# Patient Record
Sex: Female | Born: 1945
Health system: Southern US, Community
[De-identification: ages and names within clinical notes are randomized; demographics above are authoritative.]

## PROBLEM LIST (undated history)

## (undated) DIAGNOSIS — C44300 Unspecified malignant neoplasm of skin of unspecified part of face: Secondary | ICD-10-CM

## (undated) DIAGNOSIS — I493 Ventricular premature depolarization: Secondary | ICD-10-CM

## (undated) DIAGNOSIS — K219 Gastro-esophageal reflux disease without esophagitis: Secondary | ICD-10-CM

## (undated) DIAGNOSIS — E039 Hypothyroidism, unspecified: Secondary | ICD-10-CM

## (undated) DIAGNOSIS — I499 Cardiac arrhythmia, unspecified: Secondary | ICD-10-CM

## (undated) DIAGNOSIS — F32A Depression, unspecified: Secondary | ICD-10-CM

## (undated) DIAGNOSIS — F329 Major depressive disorder, single episode, unspecified: Secondary | ICD-10-CM

## (undated) DIAGNOSIS — I456 Pre-excitation syndrome: Secondary | ICD-10-CM

## (undated) DIAGNOSIS — M1990 Unspecified osteoarthritis, unspecified site: Secondary | ICD-10-CM

## (undated) DIAGNOSIS — E785 Hyperlipidemia, unspecified: Secondary | ICD-10-CM

## (undated) HISTORY — PX: SKIN CANCER EXCISION: SHX779

## (undated) HISTORY — DX: Hyperlipidemia, unspecified: E78.5

## (undated) HISTORY — DX: Ventricular premature depolarization: I49.3

## (undated) HISTORY — PX: OTHER SURGICAL HISTORY: SHX169

## (undated) HISTORY — PX: EYE SURGERY: SHX253

## (undated) HISTORY — PX: COLONOSCOPY: SHX174

## (undated) HISTORY — PX: TUBAL LIGATION: SHX77

## (undated) HISTORY — PX: TONSILLECTOMY: SUR1361

## (undated) HISTORY — DX: Pre-excitation syndrome: I45.6

---

## 2007-11-16 ENCOUNTER — Ambulatory Visit: Payer: Self-pay | Admitting: Internal Medicine

## 2008-01-25 ENCOUNTER — Ambulatory Visit: Payer: Self-pay | Admitting: Internal Medicine

## 2008-04-18 ENCOUNTER — Encounter: Payer: Self-pay | Admitting: Internal Medicine

## 2008-05-04 ENCOUNTER — Ambulatory Visit: Payer: Self-pay | Admitting: Internal Medicine

## 2009-05-13 ENCOUNTER — Ambulatory Visit: Payer: Self-pay | Admitting: Internal Medicine

## 2010-07-22 NOTE — Letter (Signed)
November 16, 2007    Laura Singleton  [address]  [place]  [place] [address]   Laura Singleton, Laura Singleton  MRN:  [id number]  /  DOB:  05-23-1945   ADDENDUM:    IMPRESSION:  1. Antegrade ventricular pre-excitation, question related to      tachycardia.  2. Frequent ventricular ectopy with a right bundle-branch block,      inferior axis morphology, negative in I and L, and upright in lead      V1.  There is an R-wave in I and a QRS in aVL.  3. Significant symptoms of dizziness and fatigue associated with      frequent ventricular ectopy.  4. Sleep disturbance, question related to frequent ventricular ectopy.  5. Normal left ventricular function.   [name], [name] has significant ventricular ectopy.  Thankfully, it does  not translate into cardiomyopathy at this point, but I do think given  her symptoms, suppression of it is essential.  I would like to try beta-  blockers initially, and I have given her prescriptions for a couple of  different ones to see how it is that she tolerates them and see whether  these will effectively suppress the PVCs and result in an increase in  her palpated heart rate.  In the event that this is not accomplishable,  antiarrhythmic drugs would be the next step.  I would like to use  flecainide.  Once the agents like flecainide and propafenone are most  safely used in the patients who do not have coronary artery disease, so  we have taken the liberty of scheduling her for a Myoview stress test at  [facility] in [place] to help US guide therapy.   Catheter ablation is also something that we could undertaken the event  that we cannot eliminate this with medication.   Ventricular arrhythmias like this when they originate from the right  side, the hearts beat spontaneously in 10-20% of patients.  I do not  know whether this will translate into these rhythms that are originating  from left side of the heart or not, so we will have to wait and see.   [name], it  was a pleasure to see her.  Thanks very much for your time  today and I look forward to talking to you.    Sincerely,      Duke Salvia, MD, [facility]    SCK/MedQ  DD: 11/16/2007  DT: 11/17/2007  Job #: [id number]

## 2010-07-22 NOTE — Letter (Signed)
May 13, 2009    Renae Fickle, MD  [address]  [place], [place] [address]   RE:  Laura, Singleton  MRN:  [id number]  /  DOB:  [birth year]   Dear [name]:   Laura Singleton comes in follow-up for asymptomatic ventricular pre-excitation  and symptomatic PVCs.  As you recall we ended up putting her on  flecainide about 15 months ago and she has tolerated this well.  There  has been a marked improvement in her overall palpitation burden.   She denies chest pain, shortness of breath, or exercise intolerance.   Reviewing the chart she had undergone Myoview scan in the fall of 2009.   Her medications in addition include Effexor as well as Levothyroxine 100  mcg a day.   On examination her blood pressure was well-controlled at 129/75 with a  pulse of 78.  Her weight was 175.  Her neck veins were flat.  Her lungs  were clear.  Heart sounds were regular.  Abdomen was soft.  Extremities  were without edema.  She is alert and oriented in no acute distress.   IMPRESSION:  1. Symptomatic PVCs.  2. Asymptomatic ventricular pre-excitation without documented      tachycardia.   We will continue Laura Singleton on her flecainide.  Probably at 3 plus or  minus years following her last stress test, we should plan to repeat it.  As as she gets older the likelihood of the development of occult  coronary disease increases and is potentially proarrhythmic substrate  for the flecainide.   Thanks for allowing [place] to see her.  We will see her again in a year's  time.    Sincerely,      Laura Salvia, MD, [facility]  Electronically Signed    SCK/MedQ  DD: 05/13/2009  DT: 05/14/2009  Job #: [id number]

## 2010-07-22 NOTE — Letter (Signed)
November 16, 2007    Laura Fickle, MD  [address]  [place], [place] [address]   RE:  Laura Singleton  MRN:  [id number]  /  DOB:  12-14-1945   Dear Laura Singleton,   It was a pleasure seeing Laura Singleton today in consultation because of her  palpitations.   As you recall, she is a 65 year old woman whom I saw years and years ago  and unfortunately I do not have my old records, but who has evidence of  ventricular pre-excitation.  I do not know if she had supraventricular  tachycardia or not.   She was found a month or so ago because of presenting with symptoms of  dizziness and fatigue of having a heart rate of 35 to be in ventricular  bigeminy.  You obtained a Holter monitor which demonstrated 55,000 PVCs  over 117,000 beats or about 50%.  They were monomorphic.  There were  runs of up to 8 beats.   An echocardiogram was done at the hospital.  I was able to obtain that  record today that demonstrated normal left ventricular function.  Her  exercise tolerance has been quite impaired in conjunction with the onset  of these symptoms over the last month or so.   She has no antecedent viral syndrome.   Her past medical history is notable for GE reflux disease, thyroid  disease, depression, constipation, fatigue, and asthma.   Past surgical history is notable for tonsillectomy and tubal ligation.   Her review of systems is broadly negative apart from what was outlined  above.   SOCIAL HISTORY:  She is married.  She has 2 children.  She does not use  cigarettes, alcohol, or recreational drugs.  She works as a Health and safety inspector at [employer].   Her medications currently include Effexor 75 t.i.d., cefoxitin 180,  Prempro, Robinul 1 mg h.s., levothyroxine 100 mcg a day.   She is allergic to SULFA causing hives.   On examination, her blood pressure is 112/70 with a pulse of 40.  Her  weight was 177.  Her HEENT exam demonstrated no icterus or xanthoma.  The neck veins were flat.  The  carotids were brisk and full bilaterally  without bruits.  The back was without kyphosis or scoliosis.  The lungs  were clear.  Heart sounds were irregular with paired bigeminal rhythm.  The abdomen was soft with active bowel sounds without midline pulsation  or hepatomegaly.  Femoral pulses were 2+.  Distal pulses were intact.  There was no clubbing, cyanosis, or edema.  Neurological exam was  grossly normal.  The skin was warm and dry.   Electrocardiogram from your office dated November 03, 2007, demonstrated  sinus rhythm at 75 beats per minute.  There was evidence of antegrade  ventricular pre-excitation probably   IMPRESSION:  1. Antegrade ventricular pre-excitation, question related to      tachycardia.  2. Frequent ventricular ectopy with a right bundle-branch block,      inferior axis morphology, negative in I and L, and upright in lead      V1.  There is an R-wave in I and a QRS in aVL.  3. Significant symptoms of dizziness and fatigue associated with      frequent ventricular ectopy.  4. Sleep disturbance, question related to frequent ventricular ectopy.  5. Normal left ventricular function.   Laura Ruiz, [name] has significant ventricular ectopy.  Thankfully, it does  not translate into cardiomyopathy at this  point, but I do think given  her symptoms, suppression of it is essential.  I would like to try beta-  blockers initially, and I have given her prescriptions for a couple of  different ones to see how it is that she tolerates them and see whether  these will effectively suppress the PVCs and result in an increase in  her palpated heart rate.  In the event that this is not accomplishable,  antiarrhythmic drugs would be the next step.  I would like to use  flecainide.  Once the agents like flecainide and propafenone are most  safely used in the patients who do not have coronary artery disease, so  we have taken the liberty of scheduling her for a Myoview stress test at   [facility] in [place] to help US guide therapy.   Catheter ablation is also something that we could undertaken the event  that we cannot eliminate this with medication.   Ventricular arrhythmias like this when they originate from the right  side, the hearts beat spontaneously in 10-20% of patients.  I do not  know whether this will translate into these rhythms that are originating  from left side of the heart or not, so we will have to wait and see.   [name], it was a pleasure to see her.  Thanks very much for your time  today and I look forward to talking to you.    Sincerely,      Duke Salvia, MD, [facility]  Electronically Signed    SCK/MedQ  DD: 11/16/2007  DT: 11/17/2007  Job #: [id number]

## 2010-07-22 NOTE — Assessment & Plan Note (Signed)
[facility]                         ELECTROPHYSIOLOGY OFFICE NOTE   NAME:Melman, Marissah                            MRN:          [id number]  DATE:05/04/2008                            DOB:          [birth year]    Mrs. Stober is seen in followup for symptomatic PVCs, treated with  flecainide 100 mg b.i.d.  She is doing way much better.  She is also  on her Effexor down titrated from 3 times a day to 2 times a day at 75  mg tablets, __________.   On examination, her blood pressure is mildly elevated at 148/94, which  was also seen last time.  Her pulse was 68.  Her lungs were clear.  Heart sounds were regular.  Extremities were without edema.   IMPRESSION:  1. Symptomatic premature ventricular contractions.  2. Hypertension.  3. Anxiety.  4. Antegrade pre-excitation without evidence of tachycardia.   Mrs. Markert is doing really very well.  I will have her follow up with Dr.  Samuel Germany regarding her blood pressure.  I have given her a tentative  followup with me in about a year.  If Dr. Samuel Germany feels comfortable  following her on her flecainide, I will be glad to see her at his  request.     Duke Salvia, MD, [facility]  Electronically Signed    SCK/MedQ  DD: 05/04/2008  DT: 05/05/2008  Job #: [id number]   cc:   Renae Fickle

## 2010-07-22 NOTE — Letter (Signed)
January 25, 2008    Laura Singleton  [address]  [place], [place] [address]   RE:  Laura Singleton, Laura Singleton  MRN:  [id number]  /  DOB:  1946-02-06   Dear Laura Singleton:   Laura Singleton comes in.  She continues to feel fatigued.  We tried her on a  beta-blocker trial with atenolol 50, metoprolol 50, Inderal LA 60; she  had no untoward effects with any of them, but none of them was  particularly associated with improvement in her symptoms notwithstanding  the fact that some of them were associated with a significant  improvement and recorded heart rates in the 60s.   This raise the question for me as to whether her Effexor may be  contributing to some of her lassitude and I suggested that she talk  about this with you and her psychiatrist to see whether either  downtitration of her drug or an alternative drugs might be helpful.   In the interim, I have suggested that we try a 1C antiarrhythmic drug to  see if we can get rid of some of her PVCs and whether that translates  also into improvement in her symptoms.  I should note that her Myoview  scan done by [facility] was normal.   PHYSICAL EXAMINATION:  On examination today her blood pressure 140/90,  her pulse is 69, and her weight was 181.  Her lungs were clear.  Heart  sounds were irregular.  Extremities were without edema.   (Electrocardiogram again demonstrated pre-excitation, which is we think  quiescent as well as pentageminy.   IMPRESSION:  1. Frequent premature ventricular contractions up to 50% according to      the Holter monitor.  2. Antegrade pre-excitation without evidence of reentrant tachycardia.  3. Fatigue, question related frequent premature ventricular versus      depression versus antidepressant side effect.   PLAN:  We will plan to see her again in about 3-4 weeks, Laura Singleton after she  gets started on the flecainide 100 mg twice daily, we will hold off on  any adjunctive beta blockers.  At this time, I have also asked her  to  follow up with a psychiatrist to see what they can do about changing her  antidepressants.    Sincerely,      Duke Salvia, MD, [facility]  Electronically Signed    SCK/MedQ  DD: 01/25/2008  DT: 01/26/2008  Job #: [id number]

## 2010-11-13 ENCOUNTER — Encounter: Payer: Self-pay | Admitting: Cardiovascular Disease

## 2014-05-02 DIAGNOSIS — Z79899 Other long term (current) drug therapy: Secondary | ICD-10-CM | POA: Diagnosis not present

## 2014-05-02 DIAGNOSIS — M818 Other osteoporosis without current pathological fracture: Secondary | ICD-10-CM | POA: Diagnosis not present

## 2014-05-02 DIAGNOSIS — E785 Hyperlipidemia, unspecified: Secondary | ICD-10-CM | POA: Diagnosis not present

## 2014-05-02 DIAGNOSIS — L309 Dermatitis, unspecified: Secondary | ICD-10-CM | POA: Diagnosis not present

## 2014-05-02 DIAGNOSIS — K210 Gastro-esophageal reflux disease with esophagitis: Secondary | ICD-10-CM | POA: Diagnosis not present

## 2014-05-02 DIAGNOSIS — R002 Palpitations: Secondary | ICD-10-CM | POA: Diagnosis not present

## 2014-05-02 DIAGNOSIS — M816 Localized osteoporosis [Lequesne]: Secondary | ICD-10-CM | POA: Diagnosis not present

## 2014-05-02 DIAGNOSIS — Z0000 Encounter for general adult medical examination without abnormal findings: Secondary | ICD-10-CM | POA: Diagnosis not present

## 2014-05-02 DIAGNOSIS — I456 Pre-excitation syndrome: Secondary | ICD-10-CM | POA: Diagnosis not present

## 2014-05-02 DIAGNOSIS — K219 Gastro-esophageal reflux disease without esophagitis: Secondary | ICD-10-CM | POA: Diagnosis not present

## 2014-08-30 DIAGNOSIS — N63 Unspecified lump in breast: Secondary | ICD-10-CM | POA: Diagnosis not present

## 2014-08-30 DIAGNOSIS — N6012 Diffuse cystic mastopathy of left breast: Secondary | ICD-10-CM | POA: Diagnosis not present

## 2014-12-11 DIAGNOSIS — H35342 Macular cyst, hole, or pseudohole, left eye: Secondary | ICD-10-CM | POA: Diagnosis not present

## 2014-12-11 DIAGNOSIS — H35373 Puckering of macula, bilateral: Secondary | ICD-10-CM | POA: Diagnosis not present

## 2014-12-11 DIAGNOSIS — Z9842 Cataract extraction status, left eye: Secondary | ICD-10-CM | POA: Diagnosis not present

## 2014-12-11 DIAGNOSIS — H43813 Vitreous degeneration, bilateral: Secondary | ICD-10-CM | POA: Diagnosis not present

## 2014-12-11 DIAGNOSIS — H5201 Hypermetropia, right eye: Secondary | ICD-10-CM | POA: Diagnosis not present

## 2014-12-11 DIAGNOSIS — Z9841 Cataract extraction status, right eye: Secondary | ICD-10-CM | POA: Diagnosis not present

## 2014-12-11 DIAGNOSIS — H43393 Other vitreous opacities, bilateral: Secondary | ICD-10-CM | POA: Diagnosis not present

## 2014-12-11 DIAGNOSIS — Z961 Presence of intraocular lens: Secondary | ICD-10-CM | POA: Diagnosis not present

## 2014-12-11 DIAGNOSIS — H35372 Puckering of macula, left eye: Secondary | ICD-10-CM | POA: Diagnosis not present

## 2015-05-17 DIAGNOSIS — Z1231 Encounter for screening mammogram for malignant neoplasm of breast: Secondary | ICD-10-CM | POA: Diagnosis not present

## 2015-05-17 DIAGNOSIS — E038 Other specified hypothyroidism: Secondary | ICD-10-CM | POA: Diagnosis not present

## 2015-05-17 DIAGNOSIS — M818 Other osteoporosis without current pathological fracture: Secondary | ICD-10-CM | POA: Diagnosis not present

## 2015-05-17 DIAGNOSIS — E663 Overweight: Secondary | ICD-10-CM | POA: Diagnosis not present

## 2015-05-17 DIAGNOSIS — Z0000 Encounter for general adult medical examination without abnormal findings: Secondary | ICD-10-CM | POA: Diagnosis not present

## 2015-05-17 DIAGNOSIS — E785 Hyperlipidemia, unspecified: Secondary | ICD-10-CM | POA: Diagnosis not present

## 2015-05-17 DIAGNOSIS — L237 Allergic contact dermatitis due to plants, except food: Secondary | ICD-10-CM | POA: Diagnosis not present

## 2015-05-17 DIAGNOSIS — Z79899 Other long term (current) drug therapy: Secondary | ICD-10-CM | POA: Diagnosis not present

## 2015-05-17 DIAGNOSIS — I456 Pre-excitation syndrome: Secondary | ICD-10-CM | POA: Diagnosis not present

## 2015-05-17 DIAGNOSIS — Z1389 Encounter for screening for other disorder: Secondary | ICD-10-CM | POA: Diagnosis not present

## 2015-05-17 DIAGNOSIS — Z6827 Body mass index (BMI) 27.0-27.9, adult: Secondary | ICD-10-CM | POA: Diagnosis not present

## 2015-08-12 DIAGNOSIS — K5900 Constipation, unspecified: Secondary | ICD-10-CM | POA: Diagnosis not present

## 2015-08-12 DIAGNOSIS — K219 Gastro-esophageal reflux disease without esophagitis: Secondary | ICD-10-CM | POA: Diagnosis not present

## 2015-08-20 DIAGNOSIS — K644 Residual hemorrhoidal skin tags: Secondary | ICD-10-CM | POA: Diagnosis not present

## 2015-08-20 DIAGNOSIS — Z79899 Other long term (current) drug therapy: Secondary | ICD-10-CM | POA: Diagnosis not present

## 2015-08-20 DIAGNOSIS — Z86010 Personal history of colonic polyps: Secondary | ICD-10-CM | POA: Diagnosis not present

## 2015-08-20 DIAGNOSIS — Z1211 Encounter for screening for malignant neoplasm of colon: Secondary | ICD-10-CM | POA: Diagnosis not present

## 2015-08-20 DIAGNOSIS — K5730 Diverticulosis of large intestine without perforation or abscess without bleeding: Secondary | ICD-10-CM | POA: Diagnosis not present

## 2015-08-20 DIAGNOSIS — K648 Other hemorrhoids: Secondary | ICD-10-CM | POA: Diagnosis not present

## 2015-08-27 DIAGNOSIS — M1380 Other specified arthritis, unspecified site: Secondary | ICD-10-CM | POA: Diagnosis not present

## 2015-09-03 DIAGNOSIS — Z1231 Encounter for screening mammogram for malignant neoplasm of breast: Secondary | ICD-10-CM | POA: Diagnosis not present

## 2015-10-01 DIAGNOSIS — M79643 Pain in unspecified hand: Secondary | ICD-10-CM | POA: Diagnosis not present

## 2015-10-01 DIAGNOSIS — L309 Dermatitis, unspecified: Secondary | ICD-10-CM | POA: Diagnosis not present

## 2015-10-01 DIAGNOSIS — Z6828 Body mass index (BMI) 28.0-28.9, adult: Secondary | ICD-10-CM | POA: Diagnosis not present

## 2015-10-09 DIAGNOSIS — G5603 Carpal tunnel syndrome, bilateral upper limbs: Secondary | ICD-10-CM | POA: Diagnosis not present

## 2015-10-09 DIAGNOSIS — M180 Bilateral primary osteoarthritis of first carpometacarpal joints: Secondary | ICD-10-CM | POA: Diagnosis not present

## 2015-10-24 DIAGNOSIS — R200 Anesthesia of skin: Secondary | ICD-10-CM | POA: Diagnosis not present

## 2015-11-14 DIAGNOSIS — M180 Bilateral primary osteoarthritis of first carpometacarpal joints: Secondary | ICD-10-CM | POA: Diagnosis not present

## 2015-11-14 DIAGNOSIS — G5603 Carpal tunnel syndrome, bilateral upper limbs: Secondary | ICD-10-CM | POA: Diagnosis not present

## 2015-11-18 DIAGNOSIS — E038 Other specified hypothyroidism: Secondary | ICD-10-CM | POA: Diagnosis not present

## 2015-11-18 DIAGNOSIS — Z6827 Body mass index (BMI) 27.0-27.9, adult: Secondary | ICD-10-CM | POA: Diagnosis not present

## 2015-11-18 DIAGNOSIS — Z01818 Encounter for other preprocedural examination: Secondary | ICD-10-CM | POA: Diagnosis not present

## 2015-11-18 DIAGNOSIS — E785 Hyperlipidemia, unspecified: Secondary | ICD-10-CM | POA: Diagnosis not present

## 2015-11-18 DIAGNOSIS — I456 Pre-excitation syndrome: Secondary | ICD-10-CM | POA: Diagnosis not present

## 2015-11-18 DIAGNOSIS — E663 Overweight: Secondary | ICD-10-CM | POA: Diagnosis not present

## 2015-11-18 DIAGNOSIS — K219 Gastro-esophageal reflux disease without esophagitis: Secondary | ICD-10-CM | POA: Diagnosis not present

## 2015-11-18 DIAGNOSIS — M79643 Pain in unspecified hand: Secondary | ICD-10-CM | POA: Diagnosis not present

## 2015-11-21 DIAGNOSIS — R8290 Unspecified abnormal findings in urine: Secondary | ICD-10-CM | POA: Diagnosis not present

## 2015-12-19 DIAGNOSIS — M180 Bilateral primary osteoarthritis of first carpometacarpal joints: Secondary | ICD-10-CM | POA: Diagnosis not present

## 2015-12-19 DIAGNOSIS — G5603 Carpal tunnel syndrome, bilateral upper limbs: Secondary | ICD-10-CM | POA: Diagnosis not present

## 2015-12-20 DIAGNOSIS — G5601 Carpal tunnel syndrome, right upper limb: Secondary | ICD-10-CM | POA: Diagnosis not present

## 2015-12-20 DIAGNOSIS — M180 Bilateral primary osteoarthritis of first carpometacarpal joints: Secondary | ICD-10-CM | POA: Diagnosis not present

## 2015-12-20 DIAGNOSIS — E038 Other specified hypothyroidism: Secondary | ICD-10-CM | POA: Diagnosis not present

## 2015-12-20 DIAGNOSIS — M1811 Unilateral primary osteoarthritis of first carpometacarpal joint, right hand: Secondary | ICD-10-CM | POA: Diagnosis not present

## 2015-12-20 DIAGNOSIS — E785 Hyperlipidemia, unspecified: Secondary | ICD-10-CM | POA: Diagnosis not present

## 2015-12-20 DIAGNOSIS — F329 Major depressive disorder, single episode, unspecified: Secondary | ICD-10-CM | POA: Diagnosis not present

## 2015-12-20 DIAGNOSIS — Z7982 Long term (current) use of aspirin: Secondary | ICD-10-CM | POA: Diagnosis not present

## 2015-12-20 DIAGNOSIS — Z87891 Personal history of nicotine dependence: Secondary | ICD-10-CM | POA: Diagnosis not present

## 2015-12-20 DIAGNOSIS — K219 Gastro-esophageal reflux disease without esophagitis: Secondary | ICD-10-CM | POA: Diagnosis not present

## 2015-12-20 DIAGNOSIS — M818 Other osteoporosis without current pathological fracture: Secondary | ICD-10-CM | POA: Diagnosis not present

## 2015-12-20 DIAGNOSIS — G8918 Other acute postprocedural pain: Secondary | ICD-10-CM | POA: Diagnosis not present

## 2016-02-26 DIAGNOSIS — M75122 Complete rotator cuff tear or rupture of left shoulder, not specified as traumatic: Secondary | ICD-10-CM | POA: Diagnosis not present

## 2016-03-11 DIAGNOSIS — M818 Other osteoporosis without current pathological fracture: Secondary | ICD-10-CM | POA: Diagnosis not present

## 2016-03-11 DIAGNOSIS — K219 Gastro-esophageal reflux disease without esophagitis: Secondary | ICD-10-CM | POA: Diagnosis not present

## 2016-03-11 DIAGNOSIS — I456 Pre-excitation syndrome: Secondary | ICD-10-CM | POA: Diagnosis not present

## 2016-03-11 DIAGNOSIS — Z79899 Other long term (current) drug therapy: Secondary | ICD-10-CM | POA: Diagnosis not present

## 2016-03-11 DIAGNOSIS — E063 Autoimmune thyroiditis: Secondary | ICD-10-CM | POA: Diagnosis not present

## 2016-03-11 DIAGNOSIS — M1990 Unspecified osteoarthritis, unspecified site: Secondary | ICD-10-CM | POA: Diagnosis not present

## 2016-03-11 DIAGNOSIS — E785 Hyperlipidemia, unspecified: Secondary | ICD-10-CM | POA: Diagnosis not present

## 2016-03-11 DIAGNOSIS — R69 Illness, unspecified: Secondary | ICD-10-CM | POA: Diagnosis not present

## 2016-03-11 DIAGNOSIS — Z6829 Body mass index (BMI) 29.0-29.9, adult: Secondary | ICD-10-CM | POA: Diagnosis not present

## 2016-03-11 DIAGNOSIS — Z0000 Encounter for general adult medical examination without abnormal findings: Secondary | ICD-10-CM | POA: Diagnosis not present

## 2016-04-09 DIAGNOSIS — G5601 Carpal tunnel syndrome, right upper limb: Secondary | ICD-10-CM | POA: Diagnosis not present

## 2016-04-09 DIAGNOSIS — M180 Bilateral primary osteoarthritis of first carpometacarpal joints: Secondary | ICD-10-CM | POA: Diagnosis not present

## 2016-05-21 DIAGNOSIS — K219 Gastro-esophageal reflux disease without esophagitis: Secondary | ICD-10-CM | POA: Diagnosis not present

## 2016-05-21 DIAGNOSIS — E063 Autoimmune thyroiditis: Secondary | ICD-10-CM | POA: Diagnosis not present

## 2016-05-21 DIAGNOSIS — R69 Illness, unspecified: Secondary | ICD-10-CM | POA: Diagnosis not present

## 2016-05-21 DIAGNOSIS — E785 Hyperlipidemia, unspecified: Secondary | ICD-10-CM | POA: Diagnosis not present

## 2016-05-21 DIAGNOSIS — E538 Deficiency of other specified B group vitamins: Secondary | ICD-10-CM | POA: Diagnosis not present

## 2016-05-21 DIAGNOSIS — R002 Palpitations: Secondary | ICD-10-CM | POA: Diagnosis not present

## 2016-05-21 DIAGNOSIS — I456 Pre-excitation syndrome: Secondary | ICD-10-CM | POA: Diagnosis not present

## 2016-05-21 DIAGNOSIS — M1990 Unspecified osteoarthritis, unspecified site: Secondary | ICD-10-CM | POA: Diagnosis not present

## 2016-05-28 DIAGNOSIS — G5601 Carpal tunnel syndrome, right upper limb: Secondary | ICD-10-CM | POA: Diagnosis not present

## 2016-05-30 DIAGNOSIS — M180 Bilateral primary osteoarthritis of first carpometacarpal joints: Secondary | ICD-10-CM | POA: Diagnosis not present

## 2016-05-30 DIAGNOSIS — M79644 Pain in right finger(s): Secondary | ICD-10-CM | POA: Diagnosis not present

## 2016-06-01 DIAGNOSIS — G5601 Carpal tunnel syndrome, right upper limb: Secondary | ICD-10-CM | POA: Diagnosis not present

## 2016-06-01 DIAGNOSIS — M180 Bilateral primary osteoarthritis of first carpometacarpal joints: Secondary | ICD-10-CM | POA: Diagnosis not present

## 2016-06-04 DIAGNOSIS — M5410 Radiculopathy, site unspecified: Secondary | ICD-10-CM | POA: Diagnosis not present

## 2016-06-04 DIAGNOSIS — M542 Cervicalgia: Secondary | ICD-10-CM | POA: Diagnosis not present

## 2016-06-18 DIAGNOSIS — R69 Illness, unspecified: Secondary | ICD-10-CM | POA: Diagnosis not present

## 2016-06-24 DIAGNOSIS — M542 Cervicalgia: Secondary | ICD-10-CM | POA: Diagnosis not present

## 2016-06-24 DIAGNOSIS — M4802 Spinal stenosis, cervical region: Secondary | ICD-10-CM | POA: Diagnosis not present

## 2016-06-24 DIAGNOSIS — M5412 Radiculopathy, cervical region: Secondary | ICD-10-CM | POA: Diagnosis not present

## 2016-07-01 DIAGNOSIS — M542 Cervicalgia: Secondary | ICD-10-CM | POA: Diagnosis not present

## 2016-08-20 DIAGNOSIS — M47812 Spondylosis without myelopathy or radiculopathy, cervical region: Secondary | ICD-10-CM | POA: Diagnosis not present

## 2016-08-20 DIAGNOSIS — M5412 Radiculopathy, cervical region: Secondary | ICD-10-CM | POA: Diagnosis not present

## 2016-09-03 DIAGNOSIS — Z1231 Encounter for screening mammogram for malignant neoplasm of breast: Secondary | ICD-10-CM | POA: Diagnosis not present

## 2016-09-18 DIAGNOSIS — M5412 Radiculopathy, cervical region: Secondary | ICD-10-CM | POA: Diagnosis not present

## 2016-10-06 ENCOUNTER — Other Ambulatory Visit: Payer: Self-pay | Admitting: Neurosurgery

## 2016-10-06 DIAGNOSIS — M4802 Spinal stenosis, cervical region: Secondary | ICD-10-CM

## 2016-10-06 DIAGNOSIS — M5412 Radiculopathy, cervical region: Secondary | ICD-10-CM

## 2016-10-06 DIAGNOSIS — Z6828 Body mass index (BMI) 28.0-28.9, adult: Secondary | ICD-10-CM | POA: Diagnosis not present

## 2016-10-06 DIAGNOSIS — G959 Disease of spinal cord, unspecified: Secondary | ICD-10-CM | POA: Diagnosis not present

## 2016-10-06 DIAGNOSIS — R030 Elevated blood-pressure reading, without diagnosis of hypertension: Secondary | ICD-10-CM | POA: Diagnosis not present

## 2016-10-06 HISTORY — DX: Spinal stenosis, cervical region: M48.02

## 2016-10-06 HISTORY — DX: Radiculopathy, cervical region: M54.12

## 2016-10-28 NOTE — Pre-Procedure Instructions (Signed)
Laura Singleton  10/28/2016      [id number] - [place], [place] [address] [place] [address] Phone: [phone] Fax: [phone]    Your procedure is scheduled on November 05, 2016  Report to [place] at 1035 AM.  Call this number if you have problems the morning of surgery:  [phone]   Remember:  Do not eat food or drink liquids after midnight.  Take these medicines the morning of surgery with A SIP OF WATER esomeprazole (nexium), flecainide (tambocor), levothyroxine (synthroid), sertraline (zoloft)  7 days prior to surgery STOP taking any meloxicam (mobic), Aspirin, Aleve, Naproxen, Ibuprofen, Motrin, Advil, Goody's, BC's, all herbal medications, fish oil, and all vitamins   Do not wear jewelry, make-up or nail polish.  Do not wear lotions, powders, or perfumes, or deoderant.  Do not shave 48 hours prior to surgery.    Do not bring valuables to the hospital.  [facility] is not responsible for any belongings or valuables.  Contacts, dentures or bridgework may not be worn into surgery.  Leave your suitcase in the car.  After surgery it may be brought to your room.  For patients admitted to the hospital, discharge time will be determined by your treatment team.  Patients discharged the day of surgery will not be allowed to drive home.   Special instructions:   [facility]- Preparing For Surgery  Before surgery, you can play an important role. Because skin is not sterile, your skin needs to be as free of germs as possible. You can reduce the number of germs on your skin by washing with CHG (chlorahexidine gluconate) Soap before surgery.  CHG is an antiseptic cleaner which kills germs and bonds with the skin to continue killing germs even after washing.  Please do not use if you have an allergy to CHG or antibacterial soaps. If your skin becomes reddened/irritated stop using the CHG.  Do not shave  (including legs and underarms) for at least 48 hours prior to first CHG shower. It is OK to shave your face.  Please follow these instructions carefully.   1. Shower the NIGHT BEFORE SURGERY and the MORNING OF SURGERY with CHG.   2. If you chose to wash your hair, wash your hair first as usual with your normal shampoo.  3. After you shampoo, rinse your hair and body thoroughly to remove the shampoo.  4. Use CHG as you would any other liquid soap. You can apply CHG directly to the skin and wash gently with a scrungie or a clean washcloth.   5. Apply the CHG Soap to your body ONLY FROM THE NECK DOWN.  Do not use on open wounds or open sores. Avoid contact with your eyes, ears, mouth and genitals (private parts). Wash genitals (private parts) with your normal soap.  6. Wash thoroughly, paying special attention to the area where your surgery will be performed.  7. Thoroughly rinse your body with warm water from the neck down.  8. DO NOT shower/wash with your normal soap after using and rinsing off the CHG Soap.  9. Pat yourself dry with a CLEAN TOWEL.   10. Wear CLEAN PAJAMAS   11. Place CLEAN SHEETS on your bed the night of your first shower and DO NOT SLEEP WITH PETS.    Day of Surgery: Do not apply any deodorants/lotions. Please wear clean clothes to the hospital/surgery center.  Please read over the following fact sheets that you were given. Pain Booklet, Coughing and Deep Breathing, MRSA Information and Surgical Site Infection Prevention

## 2016-10-29 ENCOUNTER — Encounter (HOSPITAL_COMMUNITY): Payer: Self-pay | Admitting: *Deleted

## 2016-10-29 ENCOUNTER — Encounter (HOSPITAL_COMMUNITY)
Admission: RE | Admit: 2016-10-29 | Discharge: 2016-10-29 | Disposition: A | Discharge status: Home / Self Care | Payer: Medicare HMO | Source: Ambulatory Visit | Attending: Neurosurgery | Admitting: Neurosurgery

## 2016-10-29 DIAGNOSIS — I493 Ventricular premature depolarization: Secondary | ICD-10-CM | POA: Diagnosis not present

## 2016-10-29 DIAGNOSIS — K219 Gastro-esophageal reflux disease without esophagitis: Secondary | ICD-10-CM | POA: Insufficient documentation

## 2016-10-29 DIAGNOSIS — Z87891 Personal history of nicotine dependence: Secondary | ICD-10-CM | POA: Insufficient documentation

## 2016-10-29 DIAGNOSIS — E039 Hypothyroidism, unspecified: Secondary | ICD-10-CM | POA: Insufficient documentation

## 2016-10-29 DIAGNOSIS — Z7982 Long term (current) use of aspirin: Secondary | ICD-10-CM | POA: Diagnosis not present

## 2016-10-29 DIAGNOSIS — I456 Pre-excitation syndrome: Secondary | ICD-10-CM | POA: Insufficient documentation

## 2016-10-29 DIAGNOSIS — G959 Disease of spinal cord, unspecified: Secondary | ICD-10-CM | POA: Diagnosis not present

## 2016-10-29 DIAGNOSIS — Z01818 Encounter for other preprocedural examination: Secondary | ICD-10-CM | POA: Insufficient documentation

## 2016-10-29 DIAGNOSIS — Z01812 Encounter for preprocedural laboratory examination: Secondary | ICD-10-CM | POA: Insufficient documentation

## 2016-10-29 DIAGNOSIS — Z79899 Other long term (current) drug therapy: Secondary | ICD-10-CM | POA: Insufficient documentation

## 2016-10-29 HISTORY — DX: Depression, unspecified: F32.A

## 2016-10-29 HISTORY — DX: Hypothyroidism, unspecified: E03.9

## 2016-10-29 HISTORY — DX: Major depressive disorder, single episode, unspecified: F32.9

## 2016-10-29 HISTORY — DX: Pre-excitation syndrome: I45.6

## 2016-10-29 HISTORY — DX: Gastro-esophageal reflux disease without esophagitis: K21.9

## 2016-10-29 HISTORY — DX: Unspecified osteoarthritis, unspecified site: M19.90

## 2016-10-29 HISTORY — DX: Cardiac arrhythmia, unspecified: I49.9

## 2016-10-29 LAB — CBC
HCT: 38.3 % (ref 36.0–46.0)
Hemoglobin: 13 g/dL (ref 12.0–15.0)
MCH: 29.7 pg (ref 26.0–34.0)
MCHC: 33.9 g/dL (ref 30.0–36.0)
MCV: 87.4 fL (ref 78.0–100.0)
PLATELETS: 413 10*3/uL — AB (ref 150–400)
RBC: 4.38 MIL/uL (ref 3.87–5.11)
RDW: 14 % (ref 11.5–15.5)
WBC: 9.9 10*3/uL (ref 4.0–10.5)

## 2016-10-29 LAB — BASIC METABOLIC PANEL
Anion gap: 7 (ref 5–15)
BUN: 8 mg/dL (ref 6–20)
CO2: 29 mmol/L (ref 22–32)
CREATININE: 0.76 mg/dL (ref 0.44–1.00)
Calcium: 9.6 mg/dL (ref 8.9–10.3)
Chloride: 103 mmol/L (ref 101–111)
GFR calc Af Amer: >60 mL/min (ref >60)
Glucose, Bld: 99 mg/dL (ref 65–99)
Potassium: 3.9 mmol/L (ref 3.5–5.1)
SODIUM: 139 mmol/L (ref 135–145)

## 2016-10-29 LAB — SURGICAL PCR SCREEN
MRSA, PCR: NEGATIVE
STAPHYLOCOCCUS AUREUS: NEGATIVE

## 2016-10-29 NOTE — Progress Notes (Addendum)
Anesthesia Chart Review:  Pt is a 71 year old female scheduled for C3-4, C5-6, C6-7 ACDF on 11/05/2016 with Kary Kos, MD  - PCP is Wende Neighbors, MD - used to see EP cardiologist Virl Axe, MD, last office visit 2011.  He felt pt's flecainide could be managed by PCP and pt could f/u with him prn.   PMH includes:  Symptomatic PVCs, WPW syndrome, hypothyroidism, GERD. Former smoker. BMI 29  Medications include: ASA 81mg , lipitor, nexium, flecainide, levothyroxine  BP 127/73 Comment: taken in right arm  Pulse 80   Temp 36.6 C   Resp 20   Ht 5\' 3"  (1.6 m)   Wt 164 lb 9.6 oz (74.7 kg)   SpO2 100%   BMI 29.16 kg/m   Preoperative labs reviewed.    EKG 10/29/16: NSR  Dr. Olin Pia notes indicate that pt last had a stress test in 2009 and that periodic stress testing would be necessary to r/o structural heart disease as flecainide can be proarrhythmic in heart disease.  Pt has not had a stress test since 2009.  Dr. Caryl Comes is out of the office this week.  I spoke with Devra Dopp, LPN in his office who discussed situation with Dr. Rayann Heman.  Per Altha Harm, pt will need to reestablish with cardiology for eval prior to surgery.    I notified Lorriane Shire in Dr. Windy Carina office.   Willeen Cass, FNP-BC [facility] Short Stay Surgical Center/Anesthesiology Phone: [phone] 11/03/2016 9:18 AM  Addendum:  Patient was seen by cardiologist Skeet Latch, MD on 11/04/16 for preoperative cardiac clearance and WPW/PVC follow-up. Patient has been very stable on flecainide. She can achieve 4 METS or greater without anginal symptoms. No further work-up recommended prior to surgery (see Letters tab); however, because she has been on flecainide for several years she will get an ETT in the next few months following recovery of her neck surgery. Recommendation to follow-up with Dr. Caryl Comes next year.  [facility] Short Stay Center/Anesthesiology Phone [phone] 11/04/2016 3:38 PM

## 2016-10-29 NOTE — Progress Notes (Signed)
PCP is Dr Wende Neighbors Cardiologist is Dr. Caryl Comes- reports she hasn't seen him in years, that Dr Micheal Likens gives her all her meds. States she was dx with Yves Dill parkinson white by Dr Caryl Comes Denies ever having a card cath. Request sent for last office visit and any heart studies from Dr Bettina Gavia in [place] (who she also saw in the past) Denies any dizziness, chest pain, fever, or cough.

## 2016-11-02 ENCOUNTER — Telehealth: Payer: Self-pay | Admitting: *Deleted

## 2016-11-02 NOTE — Telephone Encounter (Signed)
Lm for pt to let pt know scheduler would be calling to make an appt as needs to reestablish with EP per Dr Allred./cy

## 2016-11-04 ENCOUNTER — Encounter: Payer: Self-pay | Admitting: Cardiovascular Disease

## 2016-11-04 ENCOUNTER — Ambulatory Visit (INDEPENDENT_AMBULATORY_CARE_PROVIDER_SITE_OTHER): Payer: Medicare HMO | Admitting: Cardiovascular Disease

## 2016-11-04 ENCOUNTER — Encounter: Payer: Self-pay | Admitting: *Deleted

## 2016-11-04 VITALS — BP 122/79 | HR 70 | Ht 63.0 in | Wt 163.6 lb

## 2016-11-04 DIAGNOSIS — Z5181 Encounter for therapeutic drug level monitoring: Secondary | ICD-10-CM | POA: Diagnosis not present

## 2016-11-04 DIAGNOSIS — I456 Pre-excitation syndrome: Secondary | ICD-10-CM | POA: Diagnosis not present

## 2016-11-04 DIAGNOSIS — I493 Ventricular premature depolarization: Secondary | ICD-10-CM | POA: Diagnosis not present

## 2016-11-04 DIAGNOSIS — E7800 Pure hypercholesterolemia, unspecified: Secondary | ICD-10-CM

## 2016-11-04 DIAGNOSIS — Z79899 Other long term (current) drug therapy: Secondary | ICD-10-CM | POA: Diagnosis not present

## 2016-11-04 NOTE — Progress Notes (Signed)
Cardiology Office Note   Date:  11/05/2016   ID:  Laura Singleton, DOB Nov 28, 1945, MRN [id number]  PCP:  Ocie Doyne., MD  Cardiologist:   Skeet Latch, MD   No chief complaint on file.    History of Present Illness: Laura Singleton is a 71 y.o. female with WPW, PVCs, hypertension, hyperlipidemia, and anxiety who presents for surgical clearance. She is scheduled to undergo anterior fusion of the cervical spine with Dr. Kary Kos.  She was previously a patient of Dr. Caryl Comes, last seen 07/2010. At that time her PVCs are managed with flecainide. She has been taking this medication since that time and has not had any issues. She denies any palpitations, chest pain, shortness of breath, orthopnea, or PND. She does occasionally have lower extremity edema that is worse when it is hot outside. It always improves with elevation. She typically likes to exercise 3 times per week. She does water aerobics and walks. However lately she has not been able to exercise much due to pain in her neck and weakness in her arms and legs. She has no difficulty walking for more than 4 blocks or up more than one flight of stairs.   Past Medical History:  Diagnosis Date  . Arthritis   . Depression   . Dysrhythmia   . GERD (gastroesophageal reflux disease)   . Hyperlipidemia 11/05/2016  . Hypothyroidism   . PVC (premature ventricular contraction) 11/05/2016  . PVC's (premature ventricular contractions)   . Wolff-Parkinson-White (WPW) syndrome   . WPW (Wolff-Parkinson-White syndrome) 11/05/2016    Past Surgical History:  Procedure Laterality Date  . COLONOSCOPY    . EYE SURGERY     both eyes lens implants  . TONSILLECTOMY    . TUBAL LIGATION       Current Outpatient Prescriptions  Medication Sig Dispense Refill  . aspirin EC 81 MG tablet Take 81 mg by mouth at bedtime.    Marland Kitchen atorvastatin (LIPITOR) 20 MG tablet Take 20 mg by mouth at bedtime.    . cyclobenzaprine (FLEXERIL) 5 MG tablet Take 5 mg by mouth at  bedtime.    . docusate sodium (COLACE) 100 MG capsule Take 100 mg by mouth at bedtime.    Marland Kitchen esomeprazole (NEXIUM) 40 MG capsule Take 40 mg by mouth daily before breakfast.      . flecainide (TAMBOCOR) 100 MG tablet Take 100 mg by mouth 2 (two) times daily.      Marland Kitchen levothyroxine (SYNTHROID, LEVOTHROID) 112 MCG tablet Take 112 mcg by mouth daily before breakfast.    . meloxicam (MOBIC) 15 MG tablet Take 15 mg by mouth daily as needed for pain.    . Multiple Vitamin (MULTIVITAMIN WITH MINERALS) TABS tablet Take 1 tablet by mouth daily. Centrum Silver    . Omega-3 Fatty Acids (FISH OIL PO) Take 1 capsule by mouth 2 (two) times daily.    . sertraline (ZOLOFT) 100 MG tablet Take 200 mg by mouth daily.    . vitamin B-12 (CYANOCOBALAMIN) 500 MCG tablet Take 500 mcg by mouth at bedtime.    Marland Kitchen VITAMIN E PO Take 1 tablet by mouth daily.     No current facility-administered medications for this visit.     Allergies:   Bee venom; Parafon forte dsc [chlorzoxazone]; and Sulfa antibiotics    Social History:  The patient  reports that she has quit smoking. Her smoking use included Cigarettes. She has never used smokeless tobacco. She reports that she does not drink  alcohol or use drugs.   Family History:  The patient's family history includes High Cholesterol in her mother; High blood pressure in her brother and mother.    ROS:  Please see the history of present illness.   Otherwise, review of systems are positive for none.   All other systems are reviewed and negative.    PHYSICAL EXAM: VS:  BP 122/79 (BP Location: Right Arm, Patient Position: Sitting, Cuff Size: Normal)   Pulse 70   Ht 5\' 3"  (1.6 m)   Wt 74.2 kg (163 lb 9.6 oz)   SpO2 99%   BMI 28.98 kg/m  , BMI Body mass index is 28.98 kg/m. GENERAL:  Well appearing HEENT:  Pupils equal round and reactive, fundi not visualized, oral mucosa unremarkable NECK:  No jugular venous distention, waveform within normal limits, carotid upstroke brisk and  symmetric, no bruits, no thyromegaly LYMPHATICS:  No cervical adenopathy LUNGS:  Clear to auscultation bilaterally HEART:  RRR.  PMI not displaced or sustained,S1 and S2 within normal limits, no S3, no S4, no clicks, no rubs, no murmurs ABD:  Flat, positive bowel sounds normal in frequency in pitch, no bruits, no rebound, no guarding, no midline pulsatile mass, no hepatomegaly, no splenomegaly EXT:  2 plus pulses throughout, no edema, no cyanosis no clubbing SKIN:  No rashes no nodules NEURO:  Cranial nerves II through XII grossly intact, motor grossly intact throughout PSYCH:  Cognitively intact, oriented to person place and time   EKG:  EKG is not ordered today. The ekg ordered 10/29/16 demonstrates    Recent Labs: 10/29/2016: BUN 8; Creatinine, Ser 0.76; Hemoglobin 13.0; Platelets 413; Potassium 3.9; Sodium 139    Lipid Panel No results found for: CHOL, TRIG, HDL, CHOLHDL, VLDL, LDLCALC, LDLDIRECT    Wt Readings from Last 3 Encounters:  11/04/16 74.2 kg (163 lb 9.6 oz)  10/29/16 74.7 kg (164 lb 9.6 oz)      ASSESSMENT AND PLAN:  # Pre-surgical risk assessment: The patient does not have any unstable cardiac conditions.  Upon evaluation today, she can achieve 4 METs or greater without anginal symptoms.  According to [facility] and AHA guidelines, she requires no further cardiac workup prior to her noncardiac surgery and should be at acceptable risk.  her NSQIP risk of peri-procedural MI or cardiac arrest is 0.01.  Our service is available as necessary in the perioperative period.  # WPW:  # PVCs: Laura Singleton   Has not experienced any palpitations. She has been very stable on flecainide. However, she has not had a stress test in several years. We will get a treadmill stress test a few months. This will give her time to heal from her neck she will follow-up with Dr. Caryl Comes next year.  # Hyperlipidemia: Continue atorvastatin and fish oil.  Current medicines are reviewed at length with the  patient today.  The patient does not have concerns regarding medicines.  The following changes have been made:  no change  Labs/ tests ordered today include:   Orders Placed This Encounter  Procedures  . Exercise Tolerance Test     Disposition:   FU with Nanci Lakatos C. Oval Linsey, MD, [facility] as needed.     This note was written with the assistance of speech recognition software.  Please excuse any transcriptional errors.  Signed, Hermione Havlicek C. Oval Linsey, MD, [facility]  11/05/2016 6:35 AM    [place]

## 2016-11-04 NOTE — Patient Instructions (Signed)
Medication Instructions:  Your physician recommends that you continue on your current medications as directed. Please refer to the Current Medication list given to you today.  Labwork: none  Testing/Procedures: Your physician has requested that you have an exercise tolerance test. For further information please visit [email or web address]. Please also follow instruction sheet, as given. 2 months  Follow-Up: Your physician wants you to follow-up in: 1 year with Dr Gari Crown will receive a reminder letter in the mail two months in advance. If you don't receive a letter, please call our office to schedule the follow-up appointment.  Any Other Special Instructions Will Be Listed Below (If Applicable). YOU ARE CLEARED FOR SURGERY   If you need a refill on your cardiac medications before your next appointment, please call your pharmacy.

## 2016-11-05 ENCOUNTER — Inpatient Hospital Stay (HOSPITAL_COMMUNITY): Payer: Medicare HMO | Admitting: Certified Registered Nurse Anesthetist

## 2016-11-05 ENCOUNTER — Encounter (HOSPITAL_COMMUNITY): Payer: Self-pay | Admitting: *Deleted

## 2016-11-05 ENCOUNTER — Encounter: Payer: Self-pay | Admitting: Cardiovascular Disease

## 2016-11-05 ENCOUNTER — Inpatient Hospital Stay (HOSPITAL_COMMUNITY)
Admission: RE | Admit: 2016-11-05 | Discharge: 2016-11-06 | DRG: 472 | Disposition: A | Discharge status: Home / Self Care | Payer: Medicare HMO | Source: Ambulatory Visit | Attending: Neurosurgery | Admitting: Neurosurgery

## 2016-11-05 ENCOUNTER — Inpatient Hospital Stay (HOSPITAL_COMMUNITY): Payer: Medicare HMO

## 2016-11-05 ENCOUNTER — Inpatient Hospital Stay (HOSPITAL_COMMUNITY)
Admission: RE | Disposition: A | Discharge status: Home / Self Care | Payer: Self-pay | Source: Ambulatory Visit | Attending: Neurosurgery

## 2016-11-05 ENCOUNTER — Inpatient Hospital Stay (HOSPITAL_COMMUNITY): Payer: Medicare HMO | Admitting: Emergency Medicine

## 2016-11-05 DIAGNOSIS — G959 Disease of spinal cord, unspecified: Secondary | ICD-10-CM | POA: Diagnosis present

## 2016-11-05 DIAGNOSIS — I493 Ventricular premature depolarization: Secondary | ICD-10-CM

## 2016-11-05 DIAGNOSIS — M4322 Fusion of spine, cervical region: Secondary | ICD-10-CM | POA: Diagnosis not present

## 2016-11-05 DIAGNOSIS — Z8349 Family history of other endocrine, nutritional and metabolic diseases: Secondary | ICD-10-CM

## 2016-11-05 DIAGNOSIS — E785 Hyperlipidemia, unspecified: Secondary | ICD-10-CM

## 2016-11-05 DIAGNOSIS — K219 Gastro-esophageal reflux disease without esophagitis: Secondary | ICD-10-CM | POA: Diagnosis present

## 2016-11-05 DIAGNOSIS — I456 Pre-excitation syndrome: Secondary | ICD-10-CM | POA: Diagnosis not present

## 2016-11-05 DIAGNOSIS — Z888 Allergy status to other drugs, medicaments and biological substances status: Secondary | ICD-10-CM | POA: Diagnosis not present

## 2016-11-05 DIAGNOSIS — E039 Hypothyroidism, unspecified: Secondary | ICD-10-CM | POA: Diagnosis not present

## 2016-11-05 DIAGNOSIS — M5412 Radiculopathy, cervical region: Secondary | ICD-10-CM | POA: Diagnosis present

## 2016-11-05 DIAGNOSIS — Z882 Allergy status to sulfonamides status: Secondary | ICD-10-CM

## 2016-11-05 DIAGNOSIS — M542 Cervicalgia: Secondary | ICD-10-CM | POA: Diagnosis present

## 2016-11-05 DIAGNOSIS — M4802 Spinal stenosis, cervical region: Secondary | ICD-10-CM | POA: Diagnosis not present

## 2016-11-05 DIAGNOSIS — M4712 Other spondylosis with myelopathy, cervical region: Principal | ICD-10-CM | POA: Diagnosis present

## 2016-11-05 DIAGNOSIS — R69 Illness, unspecified: Secondary | ICD-10-CM | POA: Diagnosis not present

## 2016-11-05 DIAGNOSIS — Z91030 Bee allergy status: Secondary | ICD-10-CM

## 2016-11-05 DIAGNOSIS — M5001 Cervical disc disorder with myelopathy,  high cervical region: Secondary | ICD-10-CM | POA: Diagnosis present

## 2016-11-05 DIAGNOSIS — F329 Major depressive disorder, single episode, unspecified: Secondary | ICD-10-CM | POA: Diagnosis present

## 2016-11-05 DIAGNOSIS — Z87891 Personal history of nicotine dependence: Secondary | ICD-10-CM

## 2016-11-05 DIAGNOSIS — Z7982 Long term (current) use of aspirin: Secondary | ICD-10-CM | POA: Diagnosis not present

## 2016-11-05 DIAGNOSIS — Z419 Encounter for procedure for purposes other than remedying health state, unspecified: Secondary | ICD-10-CM

## 2016-11-05 HISTORY — DX: Disease of spinal cord, unspecified: G95.9

## 2016-11-05 HISTORY — PX: ANTERIOR CERVICAL DECOMP/DISCECTOMY FUSION: SHX1161

## 2016-11-05 HISTORY — DX: Ventricular premature depolarization: I49.3

## 2016-11-05 HISTORY — DX: Pre-excitation syndrome: I45.6

## 2016-11-05 HISTORY — DX: Radiculopathy, cervical region: M54.12

## 2016-11-05 HISTORY — DX: Hyperlipidemia, unspecified: E78.5

## 2016-11-05 SURGERY — ANTERIOR CERVICAL DECOMPRESSION/DISCECTOMY FUSION 3 LEVELS
Anesthesia: General | Site: Neck

## 2016-11-05 MED ORDER — PROPOFOL 10 MG/ML IV BOLUS
INTRAVENOUS | Status: DC | PRN
  Administered 2016-11-05: 40 mg via INTRAVENOUS
  Administered 2016-11-05: 160 mg via INTRAVENOUS

## 2016-11-05 MED ORDER — SURGIFOAM 100 EX MISC
CUTANEOUS | Status: DC | PRN
  Administered 2016-11-05: 20 mL via TOPICAL

## 2016-11-05 MED ORDER — ALUM & MAG HYDROXIDE-SIMETH 200-200-20 MG/5ML PO SUSP: 30.0000 mL | Freq: Four times a day (QID) | ORAL | Status: DC | PRN

## 2016-11-05 MED ORDER — CYANOCOBALAMIN 500 MCG PO TABS
500.0000 ug | ORAL_TABLET | Freq: Every day | ORAL | Status: DC
  Administered 2016-11-05: 500 ug via ORAL
  Filled 2016-11-05: qty 1

## 2016-11-05 MED ORDER — THROMBIN 5000 UNITS EX SOLR
OROMUCOSAL | Status: DC | PRN
  Administered 2016-11-05: 5 mL via TOPICAL

## 2016-11-05 MED ORDER — ATORVASTATIN CALCIUM 20 MG PO TABS
20.0000 mg | ORAL_TABLET | Freq: Every day | ORAL | Status: DC
  Administered 2016-11-05: 20 mg via ORAL
  Filled 2016-11-05: qty 1

## 2016-11-05 MED ORDER — PROPOFOL 10 MG/ML IV BOLUS
INTRAVENOUS | Status: AC
  Filled 2016-11-05: qty 20

## 2016-11-05 MED ORDER — CYCLOBENZAPRINE HCL 10 MG PO TABS
10.0000 mg | ORAL_TABLET | Freq: Three times a day (TID) | ORAL | Status: DC | PRN
  Administered 2016-11-05 – 2016-11-06 (×2): 10 mg via ORAL
  Filled 2016-11-05: qty 1

## 2016-11-05 MED ORDER — MIDAZOLAM HCL 5 MG/5ML IJ SOLN
INTRAMUSCULAR | Status: DC | PRN
  Administered 2016-11-05 (×2): 1 mg via INTRAVENOUS

## 2016-11-05 MED ORDER — ONDANSETRON HCL 4 MG/2ML IJ SOLN: 4.0000 mg | Freq: Four times a day (QID) | INTRAMUSCULAR | Status: DC | PRN

## 2016-11-05 MED ORDER — SODIUM CHLORIDE 0.9% FLUSH: 3.0000 mL | INTRAVENOUS | Status: DC | PRN

## 2016-11-05 MED ORDER — CYCLOBENZAPRINE HCL 5 MG PO TABS: 5.0000 mg | ORAL_TABLET | Freq: Every day | ORAL | Status: DC

## 2016-11-05 MED ORDER — SERTRALINE HCL 50 MG PO TABS
200.0000 mg | ORAL_TABLET | Freq: Every day | ORAL | Status: DC
  Administered 2016-11-06: 200 mg via ORAL
  Filled 2016-11-05: qty 4

## 2016-11-05 MED ORDER — LEVOTHYROXINE SODIUM 112 MCG PO TABS
112.0000 ug | ORAL_TABLET | Freq: Every day | ORAL | Status: DC
  Administered 2016-11-05: 112 ug via ORAL
  Filled 2016-11-05: qty 1

## 2016-11-05 MED ORDER — OXYCODONE HCL 5 MG PO TABS: 5.0000 mg | ORAL_TABLET | Freq: Once | ORAL | Status: DC | PRN

## 2016-11-05 MED ORDER — THROMBIN 20000 UNITS EX SOLR
CUTANEOUS | Status: AC
  Filled 2016-11-05: qty 20000

## 2016-11-05 MED ORDER — DEXTROSE 5 % IV SOLN
INTRAVENOUS | Status: DC | PRN
  Administered 2016-11-05: 25 ug/min via INTRAVENOUS

## 2016-11-05 MED ORDER — PANTOPRAZOLE SODIUM 40 MG IV SOLR: 40.0000 mg | Freq: Every day | INTRAVENOUS | Status: DC

## 2016-11-05 MED ORDER — SODIUM CHLORIDE 0.9 % IR SOLN
Status: DC | PRN
  Administered 2016-11-05: 500 mL

## 2016-11-05 MED ORDER — PANTOPRAZOLE SODIUM 40 MG PO TBEC
40.0000 mg | DELAYED_RELEASE_TABLET | Freq: Every day | ORAL | Status: DC
  Administered 2016-11-05: 40 mg via ORAL
  Filled 2016-11-05: qty 1

## 2016-11-05 MED ORDER — FLECAINIDE ACETATE 100 MG PO TABS
100.0000 mg | ORAL_TABLET | Freq: Two times a day (BID) | ORAL | Status: DC
  Administered 2016-11-05 – 2016-11-06 (×2): 100 mg via ORAL
  Filled 2016-11-05 (×2): qty 1

## 2016-11-05 MED ORDER — SODIUM CHLORIDE 0.9 % IV SOLN: 250.0000 mL | INTRAVENOUS | Status: DC

## 2016-11-05 MED ORDER — MEPERIDINE HCL 25 MG/ML IJ SOLN: 6.2500 mg | INTRAMUSCULAR | Status: DC | PRN

## 2016-11-05 MED ORDER — DEXAMETHASONE SODIUM PHOSPHATE 10 MG/ML IJ SOLN
10.0000 mg | INTRAMUSCULAR | Status: AC
Start: 2016-11-05 — End: 2016-11-05
  Administered 2016-11-05: 10 mg via INTRAVENOUS

## 2016-11-05 MED ORDER — THROMBIN 5000 UNITS EX SOLR
CUTANEOUS | Status: AC
  Filled 2016-11-05: qty 5000

## 2016-11-05 MED ORDER — CHLORHEXIDINE GLUCONATE CLOTH 2 % EX PADS: 6.0000 | MEDICATED_PAD | Freq: Once | CUTANEOUS | Status: DC

## 2016-11-05 MED ORDER — PROMETHAZINE HCL 25 MG/ML IJ SOLN: 6.2500 mg | INTRAMUSCULAR | Status: DC | PRN

## 2016-11-05 MED ORDER — CYCLOBENZAPRINE HCL 10 MG PO TABS
ORAL_TABLET | ORAL | Status: AC
  Filled 2016-11-05: qty 1

## 2016-11-05 MED ORDER — FENTANYL CITRATE (PF) 250 MCG/5ML IJ SOLN
INTRAMUSCULAR | Status: AC
  Filled 2016-11-05: qty 5

## 2016-11-05 MED ORDER — SUGAMMADEX SODIUM 200 MG/2ML IV SOLN
INTRAVENOUS | Status: DC | PRN
  Administered 2016-11-05: 200 mg via INTRAVENOUS

## 2016-11-05 MED ORDER — EPHEDRINE 5 MG/ML INJ
INTRAVENOUS | Status: AC
  Filled 2016-11-05: qty 10

## 2016-11-05 MED ORDER — PANTOPRAZOLE SODIUM 40 MG PO TBEC: 40.0000 mg | DELAYED_RELEASE_TABLET | Freq: Every day | ORAL | Status: DC

## 2016-11-05 MED ORDER — EPHEDRINE SULFATE-NACL 50-0.9 MG/10ML-% IV SOSY
PREFILLED_SYRINGE | INTRAVENOUS | Status: DC | PRN
  Administered 2016-11-05 (×2): 10 mg via INTRAVENOUS

## 2016-11-05 MED ORDER — 0.9 % SODIUM CHLORIDE (POUR BTL) OPTIME
TOPICAL | Status: DC | PRN
  Administered 2016-11-05: 1000 mL

## 2016-11-05 MED ORDER — ONDANSETRON HCL 4 MG/2ML IJ SOLN
INTRAMUSCULAR | Status: DC | PRN
  Administered 2016-11-05: 4 mg via INTRAVENOUS

## 2016-11-05 MED ORDER — CEFAZOLIN SODIUM-DEXTROSE 2-4 GM/100ML-% IV SOLN
2.0000 g | Freq: Three times a day (TID) | INTRAVENOUS | Status: DC
  Administered 2016-11-05 – 2016-11-06 (×2): 2 g via INTRAVENOUS
  Filled 2016-11-05 (×2): qty 100

## 2016-11-05 MED ORDER — LIDOCAINE 2% (20 MG/ML) 5 ML SYRINGE
INTRAMUSCULAR | Status: AC
  Filled 2016-11-05: qty 5

## 2016-11-05 MED ORDER — LACTATED RINGERS IV SOLN
INTRAVENOUS | Status: DC
  Administered 2016-11-05 (×2): via INTRAVENOUS

## 2016-11-05 MED ORDER — CEFAZOLIN SODIUM-DEXTROSE 2-4 GM/100ML-% IV SOLN
2.0000 g | INTRAVENOUS | Status: AC
  Administered 2016-11-05: 2 g via INTRAVENOUS

## 2016-11-05 MED ORDER — ONDANSETRON HCL 4 MG PO TABS: 4.0000 mg | ORAL_TABLET | Freq: Four times a day (QID) | ORAL | Status: DC | PRN

## 2016-11-05 MED ORDER — DEXAMETHASONE SODIUM PHOSPHATE 10 MG/ML IJ SOLN
INTRAMUSCULAR | Status: AC
  Filled 2016-11-05: qty 1

## 2016-11-05 MED ORDER — OXYCODONE HCL 5 MG PO TABS
ORAL_TABLET | ORAL | Status: AC
  Filled 2016-11-05: qty 2

## 2016-11-05 MED ORDER — HYDROMORPHONE HCL 1 MG/ML IJ SOLN: 0.5000 mg | INTRAMUSCULAR | Status: DC | PRN

## 2016-11-05 MED ORDER — MIDAZOLAM HCL 2 MG/2ML IJ SOLN
INTRAMUSCULAR | Status: AC
  Filled 2016-11-05: qty 2

## 2016-11-05 MED ORDER — ROCURONIUM BROMIDE 10 MG/ML (PF) SYRINGE
PREFILLED_SYRINGE | INTRAVENOUS | Status: DC | PRN
  Administered 2016-11-05: 50 mg via INTRAVENOUS
  Administered 2016-11-05: 20 mg via INTRAVENOUS

## 2016-11-05 MED ORDER — LIDOCAINE 2% (20 MG/ML) 5 ML SYRINGE
INTRAMUSCULAR | Status: DC | PRN
  Administered 2016-11-05: 80 mg via INTRAVENOUS

## 2016-11-05 MED ORDER — ACETAMINOPHEN 650 MG RE SUPP: 650.0000 mg | RECTAL | Status: DC | PRN

## 2016-11-05 MED ORDER — OXYCODONE HCL 5 MG PO TABS
5.0000 mg | ORAL_TABLET | ORAL | Status: DC | PRN
  Administered 2016-11-05 – 2016-11-06 (×4): 10 mg via ORAL
  Filled 2016-11-05 (×3): qty 2

## 2016-11-05 MED ORDER — HYDROMORPHONE HCL 1 MG/ML IJ SOLN
INTRAMUSCULAR | Status: AC
  Administered 2016-11-05: 0.5 mg via INTRAVENOUS
  Filled 2016-11-05: qty 1

## 2016-11-05 MED ORDER — ROCURONIUM BROMIDE 10 MG/ML (PF) SYRINGE
PREFILLED_SYRINGE | INTRAVENOUS | Status: AC
  Filled 2016-11-05: qty 5

## 2016-11-05 MED ORDER — ADULT MULTIVITAMIN W/MINERALS CH
1.0000 | ORAL_TABLET | Freq: Every day | ORAL | Status: DC
  Administered 2016-11-06: 1 via ORAL
  Filled 2016-11-05: qty 1

## 2016-11-05 MED ORDER — SODIUM CHLORIDE 0.9% FLUSH: 3.0000 mL | Freq: Two times a day (BID) | INTRAVENOUS | Status: DC

## 2016-11-05 MED ORDER — ACETAMINOPHEN 325 MG PO TABS: 650.0000 mg | ORAL_TABLET | ORAL | Status: DC | PRN

## 2016-11-05 MED ORDER — FENTANYL CITRATE (PF) 100 MCG/2ML IJ SOLN
INTRAMUSCULAR | Status: DC | PRN
  Administered 2016-11-05 (×4): 50 ug via INTRAVENOUS
  Administered 2016-11-05: 100 ug via INTRAVENOUS
  Administered 2016-11-05: 50 ug via INTRAVENOUS

## 2016-11-05 MED ORDER — OXYCODONE HCL 5 MG/5ML PO SOLN: 5.0000 mg | Freq: Once | ORAL | Status: DC | PRN

## 2016-11-05 MED ORDER — ASPIRIN EC 81 MG PO TBEC: 81.0000 mg | DELAYED_RELEASE_TABLET | Freq: Every day | ORAL | Status: DC

## 2016-11-05 MED ORDER — CEFAZOLIN SODIUM-DEXTROSE 2-4 GM/100ML-% IV SOLN
INTRAVENOUS | Status: AC
  Filled 2016-11-05: qty 100

## 2016-11-05 MED ORDER — PHENOL 1.4 % MT LIQD: 1.0000 | OROMUCOSAL | Status: DC | PRN

## 2016-11-05 MED ORDER — DOCUSATE SODIUM 100 MG PO CAPS
100.0000 mg | ORAL_CAPSULE | Freq: Every day | ORAL | Status: DC
  Administered 2016-11-05: 100 mg via ORAL
  Filled 2016-11-05: qty 1

## 2016-11-05 MED ORDER — HYDROMORPHONE HCL 1 MG/ML IJ SOLN
0.2500 mg | INTRAMUSCULAR | Status: DC | PRN
  Administered 2016-11-05 (×4): 0.5 mg via INTRAVENOUS

## 2016-11-05 MED ORDER — MENTHOL 3 MG MT LOZG: 1.0000 | LOZENGE | OROMUCOSAL | Status: DC | PRN

## 2016-11-05 SURGICAL SUPPLY — 64 items
BAG DECANTER FOR FLEXI CONT (MISCELLANEOUS) ×2 IMPLANT
BENZOIN TINCTURE PRP APPL 2/3 (GAUZE/BANDAGES/DRESSINGS) ×2 IMPLANT
BIT DRILL SM SPINE QC 12 (BIT) ×2 IMPLANT
BUR MATCHSTICK NEURO 3.0 LAGG (BURR) ×2 IMPLANT
CANISTER SUCT 3000ML PPV (MISCELLANEOUS) ×2 IMPLANT
CARTRIDGE OIL MAESTRO DRILL (MISCELLANEOUS) ×1 IMPLANT
DECANTER SPIKE VIAL GLASS SM (MISCELLANEOUS) ×2 IMPLANT
DERMABOND ADVANCED (GAUZE/BANDAGES/DRESSINGS) ×1
DERMABOND ADVANCED .7 DNX12 (GAUZE/BANDAGES/DRESSINGS) ×1 IMPLANT
DIFFUSER DRILL AIR PNEUMATIC (MISCELLANEOUS) ×2 IMPLANT
DRAIN SNY WOU 7FLT (WOUND CARE) ×2 IMPLANT
DRAPE C-ARM 42X72 X-RAY (DRAPES) ×2 IMPLANT
DRAPE LAPAROTOMY 100X72 PEDS (DRAPES) ×2 IMPLANT
DRAPE MICROSCOPE LEICA (MISCELLANEOUS) ×2 IMPLANT
DRAPE POUCH INSTRU U-SHP 10X18 (DRAPES) ×2 IMPLANT
DRSG OPSITE POSTOP 4X6 (GAUZE/BANDAGES/DRESSINGS) ×2 IMPLANT
DURAPREP 6ML APPLICATOR 50/CS (WOUND CARE) ×2 IMPLANT
ELECT COATED BLADE 2.86 ST (ELECTRODE) ×2 IMPLANT
ELECT REM PT RETURN 9FT ADLT (ELECTROSURGICAL) ×2
ELECTRODE REM PT RTRN 9FT ADLT (ELECTROSURGICAL) ×1 IMPLANT
EVACUATOR SILICONE 100CC (DRAIN) ×2 IMPLANT
GAUZE SPONGE 4X4 12PLY STRL (GAUZE/BANDAGES/DRESSINGS) ×2 IMPLANT
GAUZE SPONGE 4X4 16PLY XRAY LF (GAUZE/BANDAGES/DRESSINGS) IMPLANT
GLOVE BIO SURGEON STRL SZ7 (GLOVE) IMPLANT
GLOVE BIO SURGEON STRL SZ8 (GLOVE) ×2 IMPLANT
GLOVE BIOGEL PI IND STRL 7.0 (GLOVE) IMPLANT
GLOVE BIOGEL PI INDICATOR 7.0 (GLOVE)
GLOVE EXAM NITRILE LRG STRL (GLOVE) IMPLANT
GLOVE EXAM NITRILE XL STR (GLOVE) IMPLANT
GLOVE EXAM NITRILE XS STR PU (GLOVE) IMPLANT
GLOVE INDICATOR 8.5 STRL (GLOVE) ×2 IMPLANT
GLOVE SURG SS PI 6.5 STRL IVOR (GLOVE) ×6 IMPLANT
GOWN STRL REUS W/ TWL LRG LVL3 (GOWN DISPOSABLE) ×3 IMPLANT
GOWN STRL REUS W/ TWL XL LVL3 (GOWN DISPOSABLE) ×1 IMPLANT
GOWN STRL REUS W/TWL 2XL LVL3 (GOWN DISPOSABLE) IMPLANT
GOWN STRL REUS W/TWL LRG LVL3 (GOWN DISPOSABLE) ×3
GOWN STRL REUS W/TWL XL LVL3 (GOWN DISPOSABLE) ×1
HALTER HD/CHIN CERV TRACTION D (MISCELLANEOUS) ×2 IMPLANT
HEMOSTAT POWDER KIT SURGIFOAM (HEMOSTASIS) ×2 IMPLANT
KIT BASIN OR (CUSTOM PROCEDURE TRAY) ×2 IMPLANT
KIT ROOM TURNOVER OR (KITS) ×2 IMPLANT
NEEDLE SPNL 20GX3.5 QUINCKE YW (NEEDLE) ×2 IMPLANT
NS IRRIG 1000ML POUR BTL (IV SOLUTION) ×2 IMPLANT
OIL CARTRIDGE MAESTRO DRILL (MISCELLANEOUS) ×2
PACK LAMINECTOMY NEURO (CUSTOM PROCEDURE TRAY) ×2 IMPLANT
PLATE ANT CERV XTEND 1 LV 12 (Plate) ×2 IMPLANT
PLATE ANT CERV XTEND 2 LV 30 (Plate) ×2 IMPLANT
PUTTY BONE DBX 2.5 MIS (Bone Implant) ×2 IMPLANT
RUBBERBAND STERILE (MISCELLANEOUS) ×4 IMPLANT
SCREW XTD VAR 4.2 SELF TAP (Screw) ×20 IMPLANT
SPACER CERVICAL 11X14MM 6MM 0D (Spacer) ×2 IMPLANT
SPACER CERVICAL 11X14MM 7MM 0D (Spacer) ×4 IMPLANT
SPONGE INTESTINAL PEANUT (DISPOSABLE) ×4 IMPLANT
SPONGE SURGIFOAM ABS GEL 100 (HEMOSTASIS) ×2 IMPLANT
STRIP CLOSURE SKIN 1/2X4 (GAUZE/BANDAGES/DRESSINGS) ×2 IMPLANT
SUT BONE WAX W31G (SUTURE) ×2 IMPLANT
SUT SILK 0 TIES 10X30 (SUTURE) ×2 IMPLANT
SUT VIC AB 3-0 SH 8-18 (SUTURE) ×4 IMPLANT
SUT VICRYL 4-0 PS2 18IN ABS (SUTURE) ×2 IMPLANT
TAPE CLOTH 4X10 WHT NS (GAUZE/BANDAGES/DRESSINGS) ×2 IMPLANT
TOWEL GREEN STERILE (TOWEL DISPOSABLE) ×2 IMPLANT
TOWEL GREEN STERILE FF (TOWEL DISPOSABLE) ×2 IMPLANT
TRAP SPECIMEN MUCOUS 40CC (MISCELLANEOUS) ×2 IMPLANT
WATER STERILE IRR 1000ML POUR (IV SOLUTION) ×2 IMPLANT

## 2016-11-05 NOTE — Anesthesia Procedure Notes (Signed)
Procedure Name: Intubation Date/Time: 11/05/2016 1:02 PM Performed by: Everlean Cherry A Pre-anesthesia Checklist: Patient identified, Emergency Drugs available, Suction available and Patient being monitored Patient Re-evaluated:Patient Re-evaluated prior to induction Oxygen Delivery Method: Circle system utilized Preoxygenation: Pre-oxygenation with 100% oxygen Induction Type: IV induction Ventilation: Mask ventilation without difficulty Laryngoscope Size: Miller and 2 Grade View: Grade II Tube type: Oral Tube size: 7.0 mm Number of attempts: 1 Airway Equipment and Method: Stylet Placement Confirmation: ETT inserted through vocal cords under direct vision,  positive ETCO2 and breath sounds checked- equal and bilateral Secured at: 22 cm Tube secured with: Tape Dental Injury: Teeth and Oropharynx as per pre-operative assessment

## 2016-11-05 NOTE — Transfer of Care (Signed)
Immediate Anesthesia Transfer of Care Note  Patient: Laura Singleton  Procedure(s) Performed: Procedure(s): ANTERIOR CERVICAL DECOMPRESSION/DISCECTOMY FUSION  - CERVICAL THREE-FOUR, CERVICAL FIVE-SIX, CERVICAL SIX -SEVEN (N/A)  Patient Location: PACU  Anesthesia Type:General  Level of Consciousness: awake and alert   Airway & Oxygen Therapy: Patient Spontanous Breathing and Patient connected to nasal cannula oxygen  Post-op Assessment: Report given to RN and Post -op Vital signs reviewed and stable  Post vital signs: Reviewed and stable  Last Vitals:  Vitals:   11/05/16 1048  BP: 138/72  Pulse: 66  Resp: 19  Temp: 36.8 C  SpO2: 100%    Last Pain:  Vitals:   11/05/16 1051  PainSc: 7       Patients Stated Pain Goal: 1 (61/53/79 4327)  Complications: No apparent anesthesia complications

## 2016-11-05 NOTE — H&P (Signed)
Laura Singleton is an 71 y.o. female.   Chief Complaint: Neck pain bilateral shoulder pain HPI: 71 year female long-standing neck pain with numbness tingling in both hands workup revealed severe spinal cord compression signal change within the cord behind C4-5 which looks like it's auto fused but also some signal change behind 3 for which is a large disc herniation causing severe stenosis as well as severe stenosis C5-6 C6-7. Due to patient's failure conservative treatment imaging findings and progressive clinical syndrome I recommended anterior cervical discectomies and fusion at C3-4, and C5-6 and C6-7. I've extensively gone over the risks and benefits of the operation with her as well as perioperative course expectations of outcome and alternatives surgery and she understood and agreed to proceed forward.  Past Medical History:  Diagnosis Date  . Arthritis   . Depression   . Dysrhythmia   . GERD (gastroesophageal reflux disease)   . Hyperlipidemia 11/05/2016  . Hypothyroidism   . PVC (premature ventricular contraction) 11/05/2016  . PVC's (premature ventricular contractions)   . Wolff-Parkinson-White (WPW) syndrome   . WPW (Wolff-Parkinson-White syndrome) 11/05/2016    Past Surgical History:  Procedure Laterality Date  . COLONOSCOPY    . EYE SURGERY     both eyes lens implants  . TONSILLECTOMY    . TUBAL LIGATION      Family History  Problem Relation Age of Onset  . High Cholesterol Mother   . High blood pressure Mother   . High blood pressure Brother    Social History:  reports that she has quit smoking. Her smoking use included Cigarettes. She has never used smokeless tobacco. She reports that she does not drink alcohol or use drugs.  Allergies:  Allergies  Allergen Reactions  . Bee Venom Swelling and Other (See Comments)    Passed out  . Parafon Forte Dsc [Chlorzoxazone] Swelling and Other (See Comments)    Lips swelling and lips felt sunburned.  . Sulfa Antibiotics Hives and  Itching    Medications Prior to Admission  Medication Sig Dispense Refill  . aspirin EC 81 MG tablet Take 81 mg by mouth at bedtime.    Marland Kitchen atorvastatin (LIPITOR) 20 MG tablet Take 20 mg by mouth at bedtime.    . cyclobenzaprine (FLEXERIL) 5 MG tablet Take 5 mg by mouth at bedtime.    . docusate sodium (COLACE) 100 MG capsule Take 100 mg by mouth at bedtime.    Marland Kitchen esomeprazole (NEXIUM) 40 MG capsule Take 40 mg by mouth daily before breakfast.      . flecainide (TAMBOCOR) 100 MG tablet Take 100 mg by mouth 2 (two) times daily.      Marland Kitchen levothyroxine (SYNTHROID, LEVOTHROID) 112 MCG tablet Take 112 mcg by mouth daily before breakfast.    . meloxicam (MOBIC) 15 MG tablet Take 15 mg by mouth daily as needed for pain.    . Multiple Vitamin (MULTIVITAMIN WITH MINERALS) TABS tablet Take 1 tablet by mouth daily. Centrum Silver    . Omega-3 Fatty Acids (FISH OIL PO) Take 1 capsule by mouth 2 (two) times daily.    . sertraline (ZOLOFT) 100 MG tablet Take 200 mg by mouth daily.    . vitamin B-12 (CYANOCOBALAMIN) 500 MCG tablet Take 500 mcg by mouth at bedtime.    Marland Kitchen VITAMIN E PO Take 1 tablet by mouth daily.      No results found for this or any previous visit (from the past 48 hour(s)). No results found.  Review of Systems  Musculoskeletal: Positive for joint pain and neck pain.  Neurological: Positive for tingling and sensory change.    Blood pressure 138/72, pulse 66, temperature 98.3 F (36.8 C), resp. rate 19, SpO2 100 %. Physical Exam  Constitutional: She is oriented to person, place, and time. She appears well-developed and well-nourished.  HENT:  Head: Normocephalic.  Eyes: Pupils are equal, round, and reactive to light.  Neck: Normal range of motion.  Respiratory: Effort normal.  GI: Soft. Bowel sounds are normal.  Musculoskeletal: Normal range of motion.  Neurological: She is oriented to person, place, and time. She has normal strength. GCS eye subscore is 4. GCS verbal subscore is 5.  GCS motor subscore is 6.  Strength 5 out of 5 deltoid, bicep, tricep, wrist flexion, wrist extension, hand intrinsics.     Assessment/Plan 68 or female presents for ACDF C3-4, as well as C5-6 C6-7  Laura Singleton P, MD 11/05/2016, 12:32 PM

## 2016-11-05 NOTE — Anesthesia Preprocedure Evaluation (Signed)
Anesthesia Evaluation  Patient identified by MRN, date of birth, ID band Patient awake    Reviewed: Allergy & Precautions, NPO status , Patient's Chart, lab work & pertinent test results  Airway Mallampati: II  TM Distance: >3 FB Neck ROM: Full    Dental no notable dental hx.    Pulmonary neg pulmonary ROS, former smoker,    Pulmonary exam normal breath sounds clear to auscultation       Cardiovascular negative cardio ROS Normal cardiovascular exam+ dysrhythmias  Rhythm:Regular Rate:Normal     Neuro/Psych Depression negative neurological ROS  negative psych ROS   GI/Hepatic negative GI ROS, Neg liver ROS, GERD  ,  Endo/Other  negative endocrine ROSHypothyroidism   Renal/GU negative Renal ROS  negative genitourinary   Musculoskeletal negative musculoskeletal ROS (+) Arthritis ,   Abdominal   Peds negative pediatric ROS (+)  Hematology negative hematology ROS (+)   Anesthesia Other Findings   Reproductive/Obstetrics negative OB ROS                             Anesthesia Physical Anesthesia Plan  ASA: III  Anesthesia Plan: General   Post-op Pain Management:    Induction: Intravenous  PONV Risk Score and Plan: 3 and Ondansetron, Dexamethasone and Midazolam  Airway Management Planned: Oral ETT  Additional Equipment:   Intra-op Plan:   Post-operative Plan: Extubation in OR  Informed Consent: I have reviewed the patients History and Physical, chart, labs and discussed the procedure including the risks, benefits and alternatives for the proposed anesthesia with the patient or authorized representative who has indicated his/her understanding and acceptance.   Dental advisory given  Plan Discussed with: CRNA  Anesthesia Plan Comments:         Anesthesia Quick Evaluation

## 2016-11-05 NOTE — Op Note (Signed)
Preoperative diagnosis: Cervical spondylitic myelopathy from severe cervical stenosis at C3-4 C5-6 and C6-7  Postoperative diagnosis: Same  Procedure: Anterior cervical discectomies and fusion C3-4, C5-6 and C6-7. With allograft and separate 12 mm plate at B0-9 and allograft spacers and a 32 mm globus plate from G2-E3. Each plating system had 40 mm screws all with excellent purchase. Locking mechanisms were engaged.  Surgeon: Dominica Severin Sisto Granillo  Asst.: Glenford Peers  Anesthesia: Gen.  EBL: Minimal  History of present illness: 71 year old female whose had long-standing neck pain and spinal cord injury with signal change with her cup cord behind C4-5 as well as behind C3-4. Patient had not of fusion at C4-5 had disc herniations with severe stenosis at C3-4 and C5-6 and C6-7. Due to her progression of clinical syndrome consistent with a myelopathy and imaging findings showing severe stenosis at 3 levels I recommended a C3-4 ACDF and the C5-C7 ACDF. I extensively went over the risks and benefits of the operation with the patient as well as perioperative course expectations of outcome and alternatives surgery and she understood and agreed to proceed forward.  Operative procedure: Patient brought into the or was induced under general anesthesia positioned supine the neck in slight extension in 5 pounds of halter traction aggressive and neck prepped and draped in routine sterile fashion preoperative x-ray localize the appropriate level so a curvilinear incision was made just off midline to the anterior border of sternomastoid supraspinatus and dissected and divided longitudinally the avascular plane just between the sternomastoid and strap muscles were was developed down to the prevertebral fascia. This was dissected away with Kitners exposing initially the C3-4 disc space. Self-retaining retractors placed interoperative x-ray confirmed I defecation proper level so this discectomy was performed utilizing  high-speed drill a 2 into the Kerrison punch to remove the anterior large osteophytes under Mike's cup combination under biting of the endplates allowed indication posterior longitudinal ligament. There was a large spur coming off the C4 vertebral body this was aggressively under bitten and decompress the central canal marking laterally both C4 nerve roots were identified both C4 pedicles were also identified and the foramina were unroofed decompressed. After adequate discectomy been achieved at C3-4 I 6 mm allograft was inserted and a 12 mm globus extend plate all screws with excellent purchase. The retractors and repositioned in dissecting down and exposing C5-C7. In a similar fashion anterior aspect of bitten off with Leksell rongeur and a 3 minute Kerrison punch both disc spaces were drilled down with a high-speed drill under Mike's cup illumination there was a large posterior spurs at were aggressively under bitten the PLL was removed in piecemeal fashion exposing the thecal sac marking laterally both C7 pedicles and both C6 pedicles respectively were identified and both discectomies were performed in routine sterile fashion with a large spurs removed the posterior aspects of the C5-C6 and 7 vertebral bodies. This decompressed the thecal sac and resume normal anatomic position all foramina at C6-C7 were widely patent. Then to 7 mm Parallel allograft wedges were inserted and a 30 mm globus extend plate was placed all screws had excellent purchase locking mechanism was engaged then because hemostasis was maintained the wound scope was irrigated a J-P drain was placed and the wounds closed in layers with interrupted Vicryl and the platysma and a running 4 subcuticular in the skin Dermabond benzo and Steri-Strips and a sterile dressing was applied patient recovered in stable condition. At the end of case all needle counts sponge counts were correct.

## 2016-11-06 ENCOUNTER — Encounter (HOSPITAL_COMMUNITY): Payer: Self-pay | Admitting: Neurosurgery

## 2016-11-06 MED ORDER — CYCLOBENZAPRINE HCL 10 MG PO TABS: 10.0000 mg | ORAL_TABLET | Freq: Three times a day (TID) | ORAL | 0 refills | Status: DC | PRN

## 2016-11-06 MED ORDER — HYDROCODONE-ACETAMINOPHEN 5-325 MG PO TABS: 1.0000 | ORAL_TABLET | ORAL | 0 refills | Status: AC | PRN

## 2016-11-06 NOTE — Discharge Summary (Signed)
Physician Discharge Summary  Patient ID: Laura Singleton MRN: [id number] DOB/AGE: 08/22/1945 71 y.o.  Admit date: 11/05/2016 Discharge date: 11/06/2016  Admission Diagnoses: Cervical spondylitic myelopathy from severe cervical stenosis at C3-4 C5-6 and C6-7    Discharge Diagnoses: same as admitting   Discharged Condition: good  Hospital Course: The patient was admitted on 11/05/2016 and taken to the operating room where the patient underwent Anterior cervical discectomies and fusion C3-4, C5-6 and C6-7 . The patient tolerated the procedure well and was taken to the recovery room and then to the floor in stable condition. The hospital course was routine. There were no complications. The wound remained clean dry and intact. Pt had appropriate neck soreness. No complaints of arm pain or new N/T/W. The patient remained afebrile with stable vital signs, and tolerated a regular diet. The patient continued to increase activities, and pain was well controlled with oral pain medications.   Consults: None  Significant Diagnostic Studies:  Results for orders placed or performed during the hospital encounter of 10/29/16  Surgical pcr screen  Result Value Ref Range   MRSA, PCR NEGATIVE NEGATIVE   Staphylococcus aureus NEGATIVE NEGATIVE  Basic metabolic panel  Result Value Ref Range   Sodium 139 135 - 145 mmol/L   Potassium 3.9 3.5 - 5.1 mmol/L   Chloride 103 101 - 111 mmol/L   CO2 29 22 - 32 mmol/L   Glucose, Bld 99 65 - 99 mg/dL   BUN 8 6 - 20 mg/dL   Creatinine, Ser 0.76 0.44 - 1.00 mg/dL   Calcium 9.6 8.9 - 10.3 mg/dL   GFR calc non Af Amer >60 >60 mL/min   GFR calc Af Amer >60 >60 mL/min   Anion gap 7 5 - 15  CBC  Result Value Ref Range   WBC 9.9 4.0 - 10.5 K/uL   RBC 4.38 3.87 - 5.11 MIL/uL   Hemoglobin 13.0 12.0 - 15.0 g/dL   HCT 38.3 36.0 - 46.0 %   MCV 87.4 78.0 - 100.0 fL   MCH 29.7 26.0 - 34.0 pg   MCHC 33.9 30.0 - 36.0 g/dL   RDW 14.0 11.5 - 15.5 %   Platelets 413 (H) 150 - 400  K/uL    Dg Cervical Spine 2-3 Views  Result Date: 11/05/2016 CLINICAL DATA:  C3-4, C5-6, and C6-7 ACDF EXAM: CERVICAL SPINE - 2-3 VIEW; DG C-ARM 61-120 MIN COMPARISON:  None. FINDINGS: Lateral intra procedural fluoroscopy shows changes of C3-4, C5-6, and C6-7 ACDF. The intervertebral cages appear located. Ventral plate and screws in place. No evidence of fracture. IMPRESSION: Fluoroscopy for C3-4, C5-6, and C6-7 ACDF.  No acute finding. Electronically Signed   By: Monte Fantasia M.D.   On: 11/05/2016 16:19   Dg C-arm 1-60 Min  Result Date: 11/05/2016 CLINICAL DATA:  C3-4, C5-6, and C6-7 ACDF EXAM: CERVICAL SPINE - 2-3 VIEW; DG C-ARM 61-120 MIN COMPARISON:  None. FINDINGS: Lateral intra procedural fluoroscopy shows changes of C3-4, C5-6, and C6-7 ACDF. The intervertebral cages appear located. Ventral plate and screws in place. No evidence of fracture. IMPRESSION: Fluoroscopy for C3-4, C5-6, and C6-7 ACDF.  No acute finding. Electronically Signed   By: Monte Fantasia M.D.   On: 11/05/2016 16:19    Antibiotics:  Anti-infectives    Start     Dose/Rate Route Frequency Ordered Stop   11/05/16 2100  ceFAZolin (ANCEF) IVPB 2g/100 mL premix     2 g 200 mL/hr over 30 Minutes Intravenous Every 8 hours 11/05/16 1635  11/07/16 2059   11/05/16 1401  bacitracin 50,000 Units in sodium chloride irrigation 0.9 % 500 mL irrigation  Status:  Discontinued       As needed 11/05/16 1401 11/05/16 1621   11/05/16 1056  ceFAZolin (ANCEF) 2-4 GM/100ML-% IVPB    Comments:  Nyoka Cowden   : cabinet override      11/05/16 1056 11/05/16 1308   11/05/16 1054  ceFAZolin (ANCEF) IVPB 2g/100 mL premix     2 g 200 mL/hr over 30 Minutes Intravenous On call to O.R. 11/05/16 1054 11/05/16 1338      Discharge Exam: Blood pressure 128/70, pulse 68, temperature 98.3 F (36.8 C), resp. rate 18, SpO2 99 %. Neurologic: Grossly normal Doing well. Still reports a little tingling in her fingers but has improved a lot.  Ambulating and voiding well  Discharge Medications:   Allergies as of 11/06/2016      Reactions   Bee Venom Swelling, Other (See Comments)   Passed out   Parafon Forte Dsc [chlorzoxazone] Swelling, Other (See Comments)   Lips swelling and lips felt sunburned.   Sulfa Antibiotics Hives, Itching      Medication List    STOP taking these medications   aspirin EC 81 MG tablet     TAKE these medications   atorvastatin 20 MG tablet Commonly known as:  LIPITOR Take 20 mg by mouth at bedtime.   cyclobenzaprine 5 MG tablet Commonly known as:  FLEXERIL Take 5 mg by mouth at bedtime. What changed:  Another medication with the same name was added. Make sure you understand how and when to take each.   cyclobenzaprine 10 MG tablet Commonly known as:  FLEXERIL Take 1 tablet (10 mg total) by mouth 3 (three) times daily as needed for muscle spasms. What changed:  You were already taking a medication with the same name, and this prescription was added. Make sure you understand how and when to take each.   docusate sodium 100 MG capsule Commonly known as:  COLACE Take 100 mg by mouth at bedtime.   esomeprazole 40 MG capsule Commonly known as:  NEXIUM Take 40 mg by mouth daily before breakfast.   FISH OIL PO Take 1 capsule by mouth 2 (two) times daily.   flecainide 100 MG tablet Commonly known as:  TAMBOCOR Take 100 mg by mouth 2 (two) times daily.   HYDROcodone-acetaminophen 5-325 MG tablet Commonly known as:  NORCO/VICODIN Take 1 tablet by mouth every 4 (four) hours as needed for moderate pain.   levothyroxine 112 MCG tablet Commonly known as:  SYNTHROID, LEVOTHROID Take 112 mcg by mouth daily before breakfast.   meloxicam 15 MG tablet Commonly known as:  MOBIC Take 15 mg by mouth daily as needed for pain.   multivitamin with minerals Tabs tablet Take 1 tablet by mouth daily. Centrum Silver   sertraline 100 MG tablet Commonly known as:  ZOLOFT Take 200 mg by mouth  daily.   vitamin B-12 500 MCG tablet Commonly known as:  CYANOCOBALAMIN Take 500 mcg by mouth at bedtime.   VITAMIN E PO Take 1 tablet by mouth daily.            Discharge Care Instructions        Start     Ordered   11/06/16 0000  cyclobenzaprine (FLEXERIL) 10 MG tablet  3 times daily PRN     11/06/16 0707   11/06/16 0000  Increase activity slowly     11/06/16 6387  11/06/16 0000  Diet - low sodium heart healthy     11/06/16 0707   11/06/16 0000  Lifting restrictions    Comments:  notning more than 8 lbs   11/06/16 0707   11/06/16 0000   Remove dressing in 72 hours     11/06/16 0707   11/06/16 0000  Call MD for:  extreme fatigue     11/06/16 0707   11/06/16 0000  Call MD for:  persistant dizziness or light-headedness     11/06/16 0707   11/06/16 0000  Call MD for:  difficulty breathing, headache or visual disturbances     11/06/16 0707   11/06/16 0000  Call MD for:  redness, tenderness, or signs of infection (pain, swelling, redness, odor or green/yellow discharge around incision site)     11/06/16 0707   11/06/16 0000  Call MD for:  severe uncontrolled pain     11/06/16 0707   11/06/16 0000  Call MD for:  persistant nausea and vomiting     11/06/16 0707   11/06/16 0000  Call MD for:  temperature >100.4     11/06/16 0707   11/06/16 0000  Call MD for:  hives     11/06/16 0707   11/06/16 0000  HYDROcodone-acetaminophen (NORCO/VICODIN) 5-325 MG tablet  Every 4 hours PRN     11/06/16 0707      Disposition: home   Final Dx: same as admitting  Discharge Instructions     Remove dressing in 72 hours    Complete by:  As directed    Call MD for:  difficulty breathing, headache or visual disturbances    Complete by:  As directed    Call MD for:  extreme fatigue    Complete by:  As directed    Call MD for:  hives    Complete by:  As directed    Call MD for:  persistant dizziness or light-headedness    Complete by:  As directed    Call MD for:  persistant nausea  and vomiting    Complete by:  As directed    Call MD for:  redness, tenderness, or signs of infection (pain, swelling, redness, odor or green/yellow discharge around incision site)    Complete by:  As directed    Call MD for:  severe uncontrolled pain    Complete by:  As directed    Call MD for:  temperature >100.4    Complete by:  As directed    Diet - low sodium heart healthy    Complete by:  As directed    Increase activity slowly    Complete by:  As directed    Lifting restrictions    Complete by:  As directed    notning more than 8 lbs         Signed: Ocie Cornfield Navin Dogan 11/06/2016, 7:07 AM

## 2016-11-06 NOTE — Care Management Note (Signed)
Case Management Note  Patient Details  Name: Laura Singleton MRN: [id number] Date of Birth: [birth year]  Subjective/Objective:                 Patient with order to DC to home today. Chart reviewed. No Home Health or Equipment needs, no unacknowledged Case Management consults or medication needs identified at the time of this note. Plan for DC to home. If needs arise today prior to discharge, please call Carles Collet RN CM at [phone].    Action/Plan:   Expected Discharge Date:  11/06/16               Expected Discharge Plan:  Home/Self Care  In-House Referral:     Discharge planning Services  CM Consult  Post Acute Care Choice:    Choice offered to:     DME Arranged:    DME Agency:     HH Arranged:    HH Agency:     Status of Service:  Completed, signed off  If discussed at [employer] of Stay Meetings, dates discussed:    Additional Comments:  Carles Collet, RN 11/06/2016, 8:09 AM

## 2016-11-06 NOTE — Progress Notes (Signed)
Patient is discharged from room 3C03 at this time. Alert and in stable condition. IV site d/c'd and instructions read to patient and husband with understanding verbalized. Left unit via wheelchair with all belongings at side.

## 2016-11-11 NOTE — Anesthesia Postprocedure Evaluation (Signed)
Anesthesia Post Note  Patient: Laura Singleton  Procedure(s) Performed: Procedure(s) (LRB): ANTERIOR CERVICAL DECOMPRESSION/DISCECTOMY FUSION  - CERVICAL THREE-FOUR, CERVICAL FIVE-SIX, CERVICAL SIX -SEVEN (N/A)     Patient location during evaluation: PACU Anesthesia Type: General Level of consciousness: awake and alert Pain management: pain level controlled Vital Signs Assessment: post-procedure vital signs reviewed and stable Respiratory status: spontaneous breathing, nonlabored ventilation, respiratory function stable and patient connected to nasal cannula oxygen Cardiovascular status: blood pressure returned to baseline and stable Postop Assessment: no signs of nausea or vomiting Anesthetic complications: no    Last Vitals:  Vitals:   11/06/16 0400 11/06/16 0825  BP: 128/70 (!) 97/47  Pulse: 68 73  Resp: 18 16  Temp: 36.8 C 36.8 C  SpO2: 99% 96%    Last Pain:  Vitals:   11/06/16 1014  TempSrc:   PainSc: 4                  [name]

## 2016-11-24 DIAGNOSIS — E063 Autoimmune thyroiditis: Secondary | ICD-10-CM | POA: Diagnosis not present

## 2016-11-24 DIAGNOSIS — I456 Pre-excitation syndrome: Secondary | ICD-10-CM | POA: Diagnosis not present

## 2016-11-24 DIAGNOSIS — L989 Disorder of the skin and subcutaneous tissue, unspecified: Secondary | ICD-10-CM | POA: Diagnosis not present

## 2016-11-24 DIAGNOSIS — R69 Illness, unspecified: Secondary | ICD-10-CM | POA: Diagnosis not present

## 2016-11-24 DIAGNOSIS — E785 Hyperlipidemia, unspecified: Secondary | ICD-10-CM | POA: Diagnosis not present

## 2016-11-24 DIAGNOSIS — K219 Gastro-esophageal reflux disease without esophagitis: Secondary | ICD-10-CM | POA: Diagnosis not present

## 2016-11-24 DIAGNOSIS — Z79899 Other long term (current) drug therapy: Secondary | ICD-10-CM | POA: Diagnosis not present

## 2016-11-24 DIAGNOSIS — R002 Palpitations: Secondary | ICD-10-CM | POA: Diagnosis not present

## 2016-11-24 DIAGNOSIS — Z6824 Body mass index (BMI) 24.0-24.9, adult: Secondary | ICD-10-CM | POA: Diagnosis not present

## 2016-11-24 DIAGNOSIS — M818 Other osteoporosis without current pathological fracture: Secondary | ICD-10-CM | POA: Diagnosis not present

## 2016-12-01 DIAGNOSIS — M542 Cervicalgia: Secondary | ICD-10-CM | POA: Diagnosis not present

## 2016-12-01 DIAGNOSIS — R1312 Dysphagia, oropharyngeal phase: Secondary | ICD-10-CM

## 2016-12-01 HISTORY — DX: Dysphagia, oropharyngeal phase: R13.12

## 2016-12-07 DIAGNOSIS — C44319 Basal cell carcinoma of skin of other parts of face: Secondary | ICD-10-CM | POA: Diagnosis not present

## 2016-12-07 DIAGNOSIS — R233 Spontaneous ecchymoses: Secondary | ICD-10-CM | POA: Diagnosis not present

## 2016-12-07 DIAGNOSIS — L301 Dyshidrosis [pompholyx]: Secondary | ICD-10-CM | POA: Diagnosis not present

## 2016-12-07 DIAGNOSIS — L299 Pruritus, unspecified: Secondary | ICD-10-CM | POA: Diagnosis not present

## 2016-12-23 DIAGNOSIS — Z23 Encounter for immunization: Secondary | ICD-10-CM | POA: Diagnosis not present

## 2016-12-28 DIAGNOSIS — R69 Illness, unspecified: Secondary | ICD-10-CM | POA: Diagnosis not present

## 2017-01-05 ENCOUNTER — Encounter (HOSPITAL_COMMUNITY): Payer: Self-pay

## 2017-01-05 DIAGNOSIS — S0993XA Unspecified injury of face, initial encounter: Secondary | ICD-10-CM | POA: Diagnosis not present

## 2017-01-05 DIAGNOSIS — W109XXA Fall (on) (from) unspecified stairs and steps, initial encounter: Secondary | ICD-10-CM | POA: Diagnosis not present

## 2017-01-05 DIAGNOSIS — S032XXA Dislocation of tooth, initial encounter: Secondary | ICD-10-CM | POA: Diagnosis not present

## 2017-01-05 DIAGNOSIS — S161XXA Strain of muscle, fascia and tendon at neck level, initial encounter: Secondary | ICD-10-CM | POA: Diagnosis not present

## 2017-01-05 DIAGNOSIS — S0990XA Unspecified injury of head, initial encounter: Secondary | ICD-10-CM | POA: Diagnosis not present

## 2017-01-05 DIAGNOSIS — Y9289 Other specified places as the place of occurrence of the external cause: Secondary | ICD-10-CM | POA: Diagnosis not present

## 2017-01-05 DIAGNOSIS — M542 Cervicalgia: Secondary | ICD-10-CM | POA: Diagnosis not present

## 2017-01-05 DIAGNOSIS — S01511A Laceration without foreign body of lip, initial encounter: Secondary | ICD-10-CM | POA: Diagnosis not present

## 2017-01-06 DIAGNOSIS — S01511A Laceration without foreign body of lip, initial encounter: Secondary | ICD-10-CM | POA: Diagnosis not present

## 2017-01-06 DIAGNOSIS — W109XXA Fall (on) (from) unspecified stairs and steps, initial encounter: Secondary | ICD-10-CM | POA: Diagnosis not present

## 2017-01-06 DIAGNOSIS — S0990XA Unspecified injury of head, initial encounter: Secondary | ICD-10-CM | POA: Diagnosis not present

## 2017-01-06 DIAGNOSIS — S161XXA Strain of muscle, fascia and tendon at neck level, initial encounter: Secondary | ICD-10-CM | POA: Diagnosis not present

## 2017-01-06 DIAGNOSIS — S032XXA Dislocation of tooth, initial encounter: Secondary | ICD-10-CM | POA: Diagnosis not present

## 2017-01-23 DIAGNOSIS — C44319 Basal cell carcinoma of skin of other parts of face: Secondary | ICD-10-CM | POA: Diagnosis not present

## 2017-01-26 DIAGNOSIS — M542 Cervicalgia: Secondary | ICD-10-CM | POA: Diagnosis not present

## 2017-02-04 DIAGNOSIS — R29898 Other symptoms and signs involving the musculoskeletal system: Secondary | ICD-10-CM | POA: Diagnosis not present

## 2017-02-04 DIAGNOSIS — M5416 Radiculopathy, lumbar region: Secondary | ICD-10-CM | POA: Diagnosis not present

## 2017-02-04 DIAGNOSIS — Z6825 Body mass index (BMI) 25.0-25.9, adult: Secondary | ICD-10-CM | POA: Diagnosis not present

## 2017-02-04 DIAGNOSIS — M21371 Foot drop, right foot: Secondary | ICD-10-CM | POA: Diagnosis not present

## 2017-02-06 DIAGNOSIS — M5416 Radiculopathy, lumbar region: Secondary | ICD-10-CM | POA: Diagnosis not present

## 2017-02-06 DIAGNOSIS — M549 Dorsalgia, unspecified: Secondary | ICD-10-CM | POA: Diagnosis not present

## 2017-02-06 DIAGNOSIS — S3992XA Unspecified injury of lower back, initial encounter: Secondary | ICD-10-CM | POA: Diagnosis not present

## 2017-02-19 DIAGNOSIS — M25531 Pain in right wrist: Secondary | ICD-10-CM | POA: Diagnosis not present

## 2017-02-19 DIAGNOSIS — S6991XA Unspecified injury of right wrist, hand and finger(s), initial encounter: Secondary | ICD-10-CM | POA: Diagnosis not present

## 2017-02-26 DIAGNOSIS — M5126 Other intervertebral disc displacement, lumbar region: Secondary | ICD-10-CM | POA: Diagnosis not present

## 2017-03-19 DIAGNOSIS — M48061 Spinal stenosis, lumbar region without neurogenic claudication: Secondary | ICD-10-CM | POA: Diagnosis not present

## 2017-03-19 DIAGNOSIS — M4807 Spinal stenosis, lumbosacral region: Secondary | ICD-10-CM | POA: Diagnosis not present

## 2017-03-19 DIAGNOSIS — M4727 Other spondylosis with radiculopathy, lumbosacral region: Secondary | ICD-10-CM | POA: Diagnosis not present

## 2017-03-19 DIAGNOSIS — M4726 Other spondylosis with radiculopathy, lumbar region: Secondary | ICD-10-CM | POA: Diagnosis not present

## 2017-03-19 DIAGNOSIS — M5127 Other intervertebral disc displacement, lumbosacral region: Secondary | ICD-10-CM | POA: Diagnosis not present

## 2017-03-19 DIAGNOSIS — M5126 Other intervertebral disc displacement, lumbar region: Secondary | ICD-10-CM | POA: Diagnosis not present

## 2017-04-15 DIAGNOSIS — H02423 Myogenic ptosis of bilateral eyelids: Secondary | ICD-10-CM | POA: Diagnosis not present

## 2017-04-27 DIAGNOSIS — E063 Autoimmune thyroiditis: Secondary | ICD-10-CM | POA: Diagnosis not present

## 2017-04-27 DIAGNOSIS — Z79899 Other long term (current) drug therapy: Secondary | ICD-10-CM | POA: Diagnosis not present

## 2017-04-27 DIAGNOSIS — F329 Major depressive disorder, single episode, unspecified: Secondary | ICD-10-CM | POA: Diagnosis not present

## 2017-04-27 DIAGNOSIS — Z780 Asymptomatic menopausal state: Secondary | ICD-10-CM | POA: Diagnosis not present

## 2017-04-27 DIAGNOSIS — I456 Pre-excitation syndrome: Secondary | ICD-10-CM | POA: Diagnosis not present

## 2017-04-27 DIAGNOSIS — E785 Hyperlipidemia, unspecified: Secondary | ICD-10-CM | POA: Diagnosis not present

## 2017-04-27 DIAGNOSIS — Z1331 Encounter for screening for depression: Secondary | ICD-10-CM | POA: Diagnosis not present

## 2017-04-27 DIAGNOSIS — H02403 Unspecified ptosis of bilateral eyelids: Secondary | ICD-10-CM | POA: Diagnosis not present

## 2017-04-27 DIAGNOSIS — M818 Other osteoporosis without current pathological fracture: Secondary | ICD-10-CM | POA: Diagnosis not present

## 2017-04-30 DIAGNOSIS — R2689 Other abnormalities of gait and mobility: Secondary | ICD-10-CM | POA: Diagnosis not present

## 2017-04-30 DIAGNOSIS — M6281 Muscle weakness (generalized): Secondary | ICD-10-CM | POA: Diagnosis not present

## 2017-04-30 DIAGNOSIS — M2560 Stiffness of unspecified joint, not elsewhere classified: Secondary | ICD-10-CM | POA: Diagnosis not present

## 2017-04-30 DIAGNOSIS — M545 Low back pain: Secondary | ICD-10-CM | POA: Diagnosis not present

## 2017-05-03 DIAGNOSIS — M6281 Muscle weakness (generalized): Secondary | ICD-10-CM | POA: Diagnosis not present

## 2017-05-03 DIAGNOSIS — M545 Low back pain: Secondary | ICD-10-CM | POA: Diagnosis not present

## 2017-05-03 DIAGNOSIS — M2560 Stiffness of unspecified joint, not elsewhere classified: Secondary | ICD-10-CM | POA: Diagnosis not present

## 2017-05-03 DIAGNOSIS — R2689 Other abnormalities of gait and mobility: Secondary | ICD-10-CM | POA: Diagnosis not present

## 2017-05-06 DIAGNOSIS — M6281 Muscle weakness (generalized): Secondary | ICD-10-CM | POA: Diagnosis not present

## 2017-05-06 DIAGNOSIS — R2689 Other abnormalities of gait and mobility: Secondary | ICD-10-CM | POA: Diagnosis not present

## 2017-05-06 DIAGNOSIS — M545 Low back pain: Secondary | ICD-10-CM | POA: Diagnosis not present

## 2017-05-06 DIAGNOSIS — M2560 Stiffness of unspecified joint, not elsewhere classified: Secondary | ICD-10-CM | POA: Diagnosis not present

## 2017-05-10 DIAGNOSIS — R2689 Other abnormalities of gait and mobility: Secondary | ICD-10-CM | POA: Diagnosis not present

## 2017-05-10 DIAGNOSIS — M2560 Stiffness of unspecified joint, not elsewhere classified: Secondary | ICD-10-CM | POA: Diagnosis not present

## 2017-05-10 DIAGNOSIS — M6281 Muscle weakness (generalized): Secondary | ICD-10-CM | POA: Diagnosis not present

## 2017-05-10 DIAGNOSIS — M545 Low back pain: Secondary | ICD-10-CM | POA: Diagnosis not present

## 2017-05-12 DIAGNOSIS — M545 Low back pain: Secondary | ICD-10-CM | POA: Diagnosis not present

## 2017-05-12 DIAGNOSIS — M6281 Muscle weakness (generalized): Secondary | ICD-10-CM | POA: Diagnosis not present

## 2017-05-12 DIAGNOSIS — H02423 Myogenic ptosis of bilateral eyelids: Secondary | ICD-10-CM | POA: Diagnosis not present

## 2017-05-12 DIAGNOSIS — M2560 Stiffness of unspecified joint, not elsewhere classified: Secondary | ICD-10-CM | POA: Diagnosis not present

## 2017-05-12 DIAGNOSIS — R2689 Other abnormalities of gait and mobility: Secondary | ICD-10-CM | POA: Diagnosis not present

## 2017-05-17 DIAGNOSIS — M2560 Stiffness of unspecified joint, not elsewhere classified: Secondary | ICD-10-CM | POA: Diagnosis not present

## 2017-05-17 DIAGNOSIS — M6281 Muscle weakness (generalized): Secondary | ICD-10-CM | POA: Diagnosis not present

## 2017-05-17 DIAGNOSIS — R2689 Other abnormalities of gait and mobility: Secondary | ICD-10-CM | POA: Diagnosis not present

## 2017-05-17 DIAGNOSIS — H02423 Myogenic ptosis of bilateral eyelids: Secondary | ICD-10-CM | POA: Diagnosis not present

## 2017-05-17 DIAGNOSIS — M545 Low back pain: Secondary | ICD-10-CM | POA: Diagnosis not present

## 2017-05-19 DIAGNOSIS — M85832 Other specified disorders of bone density and structure, left forearm: Secondary | ICD-10-CM | POA: Diagnosis not present

## 2017-05-19 DIAGNOSIS — R2689 Other abnormalities of gait and mobility: Secondary | ICD-10-CM | POA: Diagnosis not present

## 2017-05-19 DIAGNOSIS — N959 Unspecified menopausal and perimenopausal disorder: Secondary | ICD-10-CM | POA: Diagnosis not present

## 2017-05-19 DIAGNOSIS — M2560 Stiffness of unspecified joint, not elsewhere classified: Secondary | ICD-10-CM | POA: Diagnosis not present

## 2017-05-19 DIAGNOSIS — M8589 Other specified disorders of bone density and structure, multiple sites: Secondary | ICD-10-CM | POA: Diagnosis not present

## 2017-05-19 DIAGNOSIS — M545 Low back pain: Secondary | ICD-10-CM | POA: Diagnosis not present

## 2017-05-19 DIAGNOSIS — M6281 Muscle weakness (generalized): Secondary | ICD-10-CM | POA: Diagnosis not present

## 2017-05-24 DIAGNOSIS — M2560 Stiffness of unspecified joint, not elsewhere classified: Secondary | ICD-10-CM | POA: Diagnosis not present

## 2017-05-24 DIAGNOSIS — M545 Low back pain: Secondary | ICD-10-CM | POA: Diagnosis not present

## 2017-05-24 DIAGNOSIS — R2689 Other abnormalities of gait and mobility: Secondary | ICD-10-CM | POA: Diagnosis not present

## 2017-05-24 DIAGNOSIS — M6281 Muscle weakness (generalized): Secondary | ICD-10-CM | POA: Diagnosis not present

## 2017-05-26 DIAGNOSIS — M545 Low back pain: Secondary | ICD-10-CM | POA: Diagnosis not present

## 2017-05-26 DIAGNOSIS — R2689 Other abnormalities of gait and mobility: Secondary | ICD-10-CM | POA: Diagnosis not present

## 2017-05-26 DIAGNOSIS — M6281 Muscle weakness (generalized): Secondary | ICD-10-CM | POA: Diagnosis not present

## 2017-05-26 DIAGNOSIS — M2560 Stiffness of unspecified joint, not elsewhere classified: Secondary | ICD-10-CM | POA: Diagnosis not present

## 2017-05-31 DIAGNOSIS — M545 Low back pain: Secondary | ICD-10-CM | POA: Diagnosis not present

## 2017-05-31 DIAGNOSIS — M2560 Stiffness of unspecified joint, not elsewhere classified: Secondary | ICD-10-CM | POA: Diagnosis not present

## 2017-05-31 DIAGNOSIS — R2689 Other abnormalities of gait and mobility: Secondary | ICD-10-CM | POA: Diagnosis not present

## 2017-05-31 DIAGNOSIS — M6281 Muscle weakness (generalized): Secondary | ICD-10-CM | POA: Diagnosis not present

## 2017-06-02 DIAGNOSIS — H02422 Myogenic ptosis of left eyelid: Secondary | ICD-10-CM | POA: Diagnosis not present

## 2017-06-02 DIAGNOSIS — H02421 Myogenic ptosis of right eyelid: Secondary | ICD-10-CM | POA: Diagnosis not present

## 2017-06-02 DIAGNOSIS — H02423 Myogenic ptosis of bilateral eyelids: Secondary | ICD-10-CM | POA: Diagnosis not present

## 2017-06-02 DIAGNOSIS — H02403 Unspecified ptosis of bilateral eyelids: Secondary | ICD-10-CM | POA: Diagnosis not present

## 2017-06-04 DIAGNOSIS — R2689 Other abnormalities of gait and mobility: Secondary | ICD-10-CM | POA: Diagnosis not present

## 2017-06-04 DIAGNOSIS — M2560 Stiffness of unspecified joint, not elsewhere classified: Secondary | ICD-10-CM | POA: Diagnosis not present

## 2017-06-04 DIAGNOSIS — M545 Low back pain: Secondary | ICD-10-CM | POA: Diagnosis not present

## 2017-06-04 DIAGNOSIS — M6281 Muscle weakness (generalized): Secondary | ICD-10-CM | POA: Diagnosis not present

## 2017-06-07 DIAGNOSIS — R2689 Other abnormalities of gait and mobility: Secondary | ICD-10-CM | POA: Diagnosis not present

## 2017-06-07 DIAGNOSIS — M545 Low back pain: Secondary | ICD-10-CM | POA: Diagnosis not present

## 2017-06-07 DIAGNOSIS — M2560 Stiffness of unspecified joint, not elsewhere classified: Secondary | ICD-10-CM | POA: Diagnosis not present

## 2017-06-07 DIAGNOSIS — M6281 Muscle weakness (generalized): Secondary | ICD-10-CM | POA: Diagnosis not present

## 2017-06-08 DIAGNOSIS — M542 Cervicalgia: Secondary | ICD-10-CM | POA: Diagnosis not present

## 2017-06-08 DIAGNOSIS — M545 Low back pain: Secondary | ICD-10-CM | POA: Diagnosis not present

## 2017-06-09 DIAGNOSIS — M6281 Muscle weakness (generalized): Secondary | ICD-10-CM | POA: Diagnosis not present

## 2017-06-09 DIAGNOSIS — R2689 Other abnormalities of gait and mobility: Secondary | ICD-10-CM | POA: Diagnosis not present

## 2017-06-09 DIAGNOSIS — M2560 Stiffness of unspecified joint, not elsewhere classified: Secondary | ICD-10-CM | POA: Diagnosis not present

## 2017-06-09 DIAGNOSIS — M545 Low back pain: Secondary | ICD-10-CM | POA: Diagnosis not present

## 2017-06-14 DIAGNOSIS — M2560 Stiffness of unspecified joint, not elsewhere classified: Secondary | ICD-10-CM | POA: Diagnosis not present

## 2017-06-14 DIAGNOSIS — M6281 Muscle weakness (generalized): Secondary | ICD-10-CM | POA: Diagnosis not present

## 2017-06-14 DIAGNOSIS — R2689 Other abnormalities of gait and mobility: Secondary | ICD-10-CM | POA: Diagnosis not present

## 2017-06-14 DIAGNOSIS — M545 Low back pain: Secondary | ICD-10-CM | POA: Diagnosis not present

## 2017-06-16 DIAGNOSIS — M6281 Muscle weakness (generalized): Secondary | ICD-10-CM | POA: Diagnosis not present

## 2017-06-16 DIAGNOSIS — M545 Low back pain: Secondary | ICD-10-CM | POA: Diagnosis not present

## 2017-06-16 DIAGNOSIS — R2689 Other abnormalities of gait and mobility: Secondary | ICD-10-CM | POA: Diagnosis not present

## 2017-06-16 DIAGNOSIS — M2560 Stiffness of unspecified joint, not elsewhere classified: Secondary | ICD-10-CM | POA: Diagnosis not present

## 2017-06-21 DIAGNOSIS — R2689 Other abnormalities of gait and mobility: Secondary | ICD-10-CM | POA: Diagnosis not present

## 2017-06-21 DIAGNOSIS — M6281 Muscle weakness (generalized): Secondary | ICD-10-CM | POA: Diagnosis not present

## 2017-06-21 DIAGNOSIS — M2560 Stiffness of unspecified joint, not elsewhere classified: Secondary | ICD-10-CM | POA: Diagnosis not present

## 2017-06-21 DIAGNOSIS — M545 Low back pain: Secondary | ICD-10-CM | POA: Diagnosis not present

## 2017-06-23 DIAGNOSIS — M6281 Muscle weakness (generalized): Secondary | ICD-10-CM | POA: Diagnosis not present

## 2017-06-23 DIAGNOSIS — M2560 Stiffness of unspecified joint, not elsewhere classified: Secondary | ICD-10-CM | POA: Diagnosis not present

## 2017-06-23 DIAGNOSIS — R2689 Other abnormalities of gait and mobility: Secondary | ICD-10-CM | POA: Diagnosis not present

## 2017-06-23 DIAGNOSIS — M545 Low back pain: Secondary | ICD-10-CM | POA: Diagnosis not present

## 2017-06-24 DIAGNOSIS — M4322 Fusion of spine, cervical region: Secondary | ICD-10-CM | POA: Diagnosis not present

## 2017-06-24 DIAGNOSIS — M542 Cervicalgia: Secondary | ICD-10-CM | POA: Diagnosis not present

## 2017-06-24 DIAGNOSIS — Z981 Arthrodesis status: Secondary | ICD-10-CM | POA: Diagnosis not present

## 2017-06-29 DIAGNOSIS — R2689 Other abnormalities of gait and mobility: Secondary | ICD-10-CM | POA: Diagnosis not present

## 2017-06-29 DIAGNOSIS — M2560 Stiffness of unspecified joint, not elsewhere classified: Secondary | ICD-10-CM | POA: Diagnosis not present

## 2017-06-29 DIAGNOSIS — M6281 Muscle weakness (generalized): Secondary | ICD-10-CM | POA: Diagnosis not present

## 2017-06-29 DIAGNOSIS — M545 Low back pain: Secondary | ICD-10-CM | POA: Diagnosis not present

## 2017-07-01 DIAGNOSIS — R2689 Other abnormalities of gait and mobility: Secondary | ICD-10-CM | POA: Diagnosis not present

## 2017-07-01 DIAGNOSIS — M545 Low back pain: Secondary | ICD-10-CM | POA: Diagnosis not present

## 2017-07-01 DIAGNOSIS — M2560 Stiffness of unspecified joint, not elsewhere classified: Secondary | ICD-10-CM | POA: Diagnosis not present

## 2017-07-01 DIAGNOSIS — M6281 Muscle weakness (generalized): Secondary | ICD-10-CM | POA: Diagnosis not present

## 2017-07-27 DIAGNOSIS — L299 Pruritus, unspecified: Secondary | ICD-10-CM | POA: Diagnosis not present

## 2017-07-27 DIAGNOSIS — L300 Nummular dermatitis: Secondary | ICD-10-CM | POA: Diagnosis not present

## 2017-09-30 DIAGNOSIS — S93402A Sprain of unspecified ligament of left ankle, initial encounter: Secondary | ICD-10-CM | POA: Diagnosis not present

## 2017-10-01 DIAGNOSIS — S93402A Sprain of unspecified ligament of left ankle, initial encounter: Secondary | ICD-10-CM | POA: Diagnosis not present

## 2017-10-12 DIAGNOSIS — L820 Inflamed seborrheic keratosis: Secondary | ICD-10-CM | POA: Diagnosis not present

## 2017-12-22 DIAGNOSIS — M25562 Pain in left knee: Secondary | ICD-10-CM | POA: Diagnosis not present

## 2017-12-22 DIAGNOSIS — R002 Palpitations: Secondary | ICD-10-CM | POA: Diagnosis not present

## 2017-12-22 DIAGNOSIS — Z0000 Encounter for general adult medical examination without abnormal findings: Secondary | ICD-10-CM | POA: Diagnosis not present

## 2017-12-22 DIAGNOSIS — Z9181 History of falling: Secondary | ICD-10-CM | POA: Diagnosis not present

## 2017-12-22 DIAGNOSIS — M1990 Unspecified osteoarthritis, unspecified site: Secondary | ICD-10-CM | POA: Diagnosis not present

## 2017-12-22 DIAGNOSIS — E785 Hyperlipidemia, unspecified: Secondary | ICD-10-CM | POA: Diagnosis not present

## 2017-12-22 DIAGNOSIS — Z23 Encounter for immunization: Secondary | ICD-10-CM | POA: Diagnosis not present

## 2017-12-22 DIAGNOSIS — Z1339 Encounter for screening examination for other mental health and behavioral disorders: Secondary | ICD-10-CM | POA: Diagnosis not present

## 2017-12-22 DIAGNOSIS — E063 Autoimmune thyroiditis: Secondary | ICD-10-CM | POA: Diagnosis not present

## 2017-12-24 DIAGNOSIS — M1712 Unilateral primary osteoarthritis, left knee: Secondary | ICD-10-CM | POA: Diagnosis not present

## 2018-01-04 DIAGNOSIS — Z1231 Encounter for screening mammogram for malignant neoplasm of breast: Secondary | ICD-10-CM | POA: Diagnosis not present

## 2018-03-11 DIAGNOSIS — Z6827 Body mass index (BMI) 27.0-27.9, adult: Secondary | ICD-10-CM | POA: Diagnosis not present

## 2018-03-11 DIAGNOSIS — M1990 Unspecified osteoarthritis, unspecified site: Secondary | ICD-10-CM | POA: Diagnosis not present

## 2018-03-11 DIAGNOSIS — M5412 Radiculopathy, cervical region: Secondary | ICD-10-CM | POA: Diagnosis not present

## 2018-04-15 DIAGNOSIS — L237 Allergic contact dermatitis due to plants, except food: Secondary | ICD-10-CM | POA: Diagnosis not present

## 2018-04-15 DIAGNOSIS — Z6827 Body mass index (BMI) 27.0-27.9, adult: Secondary | ICD-10-CM | POA: Diagnosis not present

## 2018-05-02 DIAGNOSIS — Z6826 Body mass index (BMI) 26.0-26.9, adult: Secondary | ICD-10-CM | POA: Diagnosis not present

## 2018-05-02 DIAGNOSIS — M1990 Unspecified osteoarthritis, unspecified site: Secondary | ICD-10-CM | POA: Diagnosis not present

## 2018-05-02 DIAGNOSIS — M5412 Radiculopathy, cervical region: Secondary | ICD-10-CM | POA: Diagnosis not present

## 2018-05-05 DIAGNOSIS — G959 Disease of spinal cord, unspecified: Secondary | ICD-10-CM | POA: Diagnosis not present

## 2018-05-05 DIAGNOSIS — M5440 Lumbago with sciatica, unspecified side: Secondary | ICD-10-CM | POA: Diagnosis not present

## 2018-05-26 DIAGNOSIS — M4802 Spinal stenosis, cervical region: Secondary | ICD-10-CM | POA: Diagnosis not present

## 2018-05-26 DIAGNOSIS — M545 Low back pain: Secondary | ICD-10-CM | POA: Diagnosis not present

## 2018-05-26 DIAGNOSIS — M5440 Lumbago with sciatica, unspecified side: Secondary | ICD-10-CM | POA: Diagnosis not present

## 2018-05-26 DIAGNOSIS — G959 Disease of spinal cord, unspecified: Secondary | ICD-10-CM | POA: Diagnosis not present

## 2018-05-31 DIAGNOSIS — G629 Polyneuropathy, unspecified: Secondary | ICD-10-CM

## 2018-05-31 DIAGNOSIS — M48062 Spinal stenosis, lumbar region with neurogenic claudication: Secondary | ICD-10-CM | POA: Diagnosis not present

## 2018-05-31 DIAGNOSIS — M542 Cervicalgia: Secondary | ICD-10-CM | POA: Diagnosis not present

## 2018-05-31 HISTORY — DX: Polyneuropathy, unspecified: G62.9

## 2018-06-07 DIAGNOSIS — G5623 Lesion of ulnar nerve, bilateral upper limbs: Secondary | ICD-10-CM | POA: Diagnosis not present

## 2018-06-07 DIAGNOSIS — G5603 Carpal tunnel syndrome, bilateral upper limbs: Secondary | ICD-10-CM | POA: Diagnosis not present

## 2018-06-07 DIAGNOSIS — G5600 Carpal tunnel syndrome, unspecified upper limb: Secondary | ICD-10-CM

## 2018-06-07 HISTORY — DX: Carpal tunnel syndrome, unspecified upper limb: G56.00

## 2018-09-05 IMAGING — RF DG C-ARM 61-120 MIN
1 series · 2 of 2 positions shown · non-contrast
Comparison: None.

CLINICAL DATA: C3-4, C5-6, and C6-7 ACDF

EXAM:
CERVICAL SPINE - 2-3 VIEW; DG C-ARM 61-120 MIN

[Series 1: run · 2 of 2 slices shown]
[im 1/2]
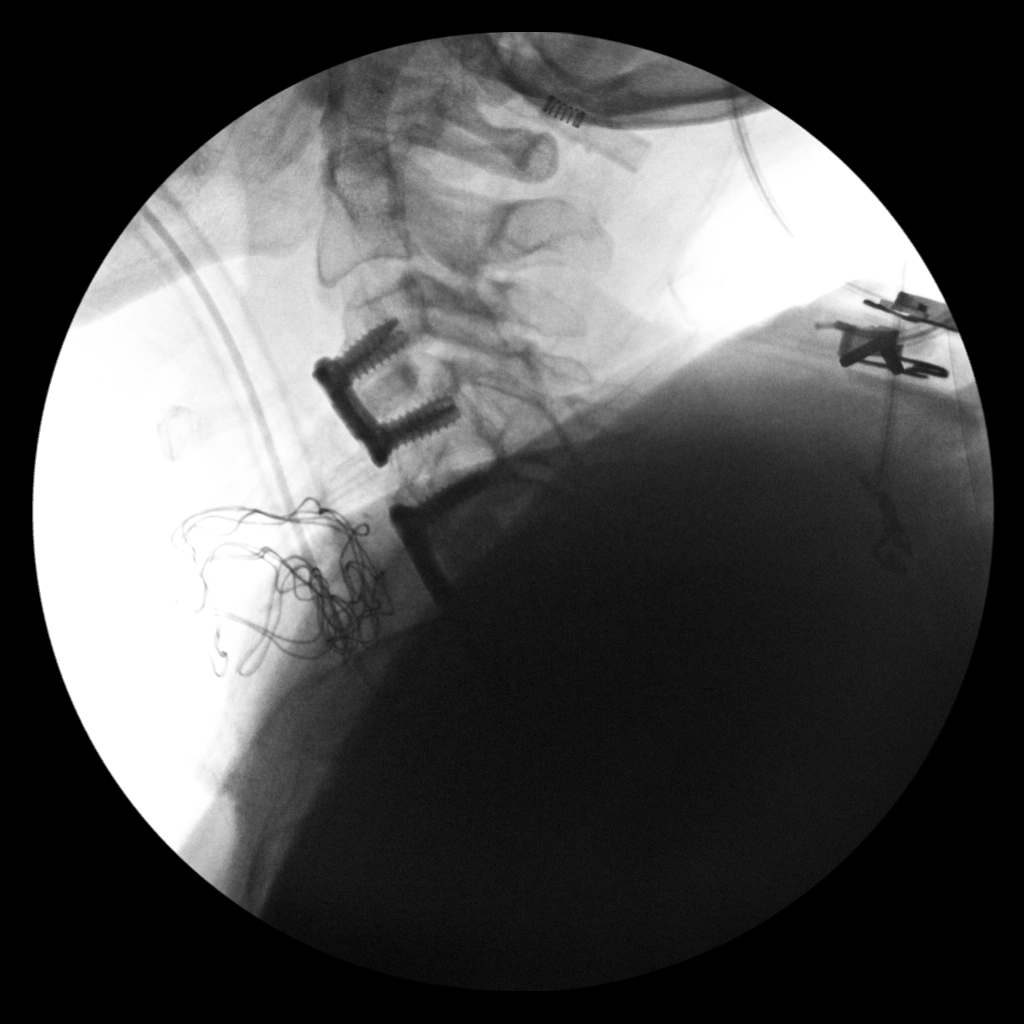
[im 2/2]
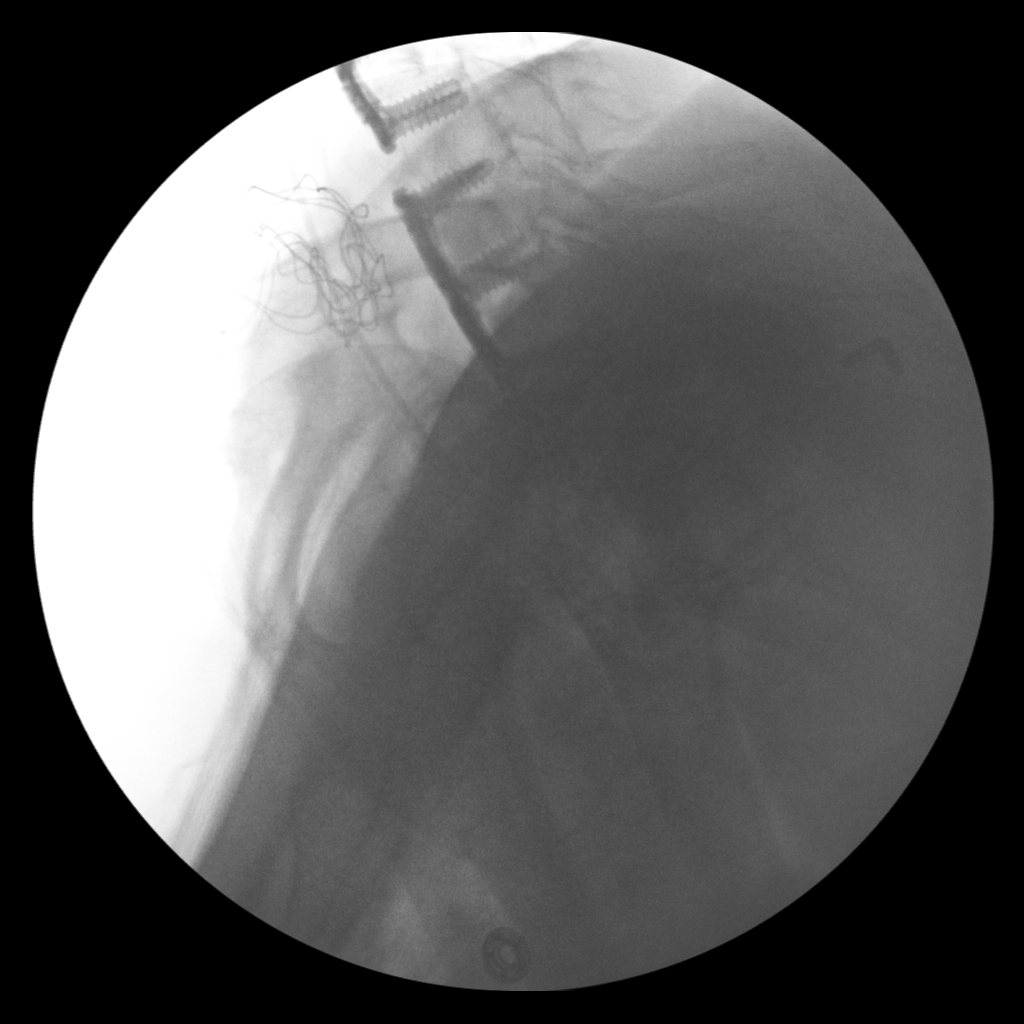

[2 of 2 positions shown; findings below may reference images not displayed]

FINDINGS: Lateral intra procedural fluoroscopy shows changes of C3-4, C5-6,
and C6-7 ACDF. The intervertebral cages appear located. Ventral
plate and screws in place. No evidence of fracture.
IMPRESSION: Fluoroscopy for C3-4, C5-6, and C6-7 ACDF.  No acute finding.

## 2018-10-26 ENCOUNTER — Other Ambulatory Visit: Payer: Self-pay

## 2018-10-26 NOTE — Patient Outreach (Signed)
[facility]) Care Management  10/26/2018  Laura Singleton 08/23/1945 [id number]   Telephone Screen  Referral Date: 10/25/2018 Referral Source: Join EMMI Call Referral Reason: "iregular heart rhythm, overwhelmed caring for spouse and needs to have knee surgery" Insurance: [employer]   Outreach attempt #1 to patient. Spoke with patient and screening completed. Patient is doing well herself other than needing to have knee surgery. Her main focus and primary focus of discussion is in regard to her spouse. Patient states that she is independent with ADLs/IADLs. She is able to drive herself to appts and takes spouse to his appts as well. She is taking about five prescription meds and some OTC meds/supplements. She denies any issues managing and/or affording meds at this time. She reports that her only medical issues are A-fib, hypothyroidism and HLD. She states that she is able to manage these conditions on her own. Patient interested not in services/asssitance for herself but more so for her spouse. RN CM addressed issues regarding her spouse and appropriate referral made.     Plan: RN CM will close case as no further interventions needed at this time.    Enzo Montgomery, RN,BSN,CCM [facility] Telephonic Care Management Coordinator Direct Phone: [phone] Toll Free: [phone] Fax: [phone]

## 2018-11-17 DIAGNOSIS — M25562 Pain in left knee: Secondary | ICD-10-CM | POA: Diagnosis not present

## 2018-11-17 DIAGNOSIS — M25462 Effusion, left knee: Secondary | ICD-10-CM | POA: Diagnosis not present

## 2018-11-17 DIAGNOSIS — G8929 Other chronic pain: Secondary | ICD-10-CM

## 2018-11-17 DIAGNOSIS — M11262 Other chondrocalcinosis, left knee: Secondary | ICD-10-CM | POA: Diagnosis not present

## 2018-11-17 DIAGNOSIS — M25762 Osteophyte, left knee: Secondary | ICD-10-CM | POA: Diagnosis not present

## 2018-11-17 DIAGNOSIS — M1712 Unilateral primary osteoarthritis, left knee: Secondary | ICD-10-CM | POA: Diagnosis not present

## 2018-11-17 HISTORY — DX: Other chronic pain: G89.29

## 2019-01-04 DIAGNOSIS — F329 Major depressive disorder, single episode, unspecified: Secondary | ICD-10-CM | POA: Diagnosis not present

## 2019-01-04 DIAGNOSIS — Z0000 Encounter for general adult medical examination without abnormal findings: Secondary | ICD-10-CM | POA: Diagnosis not present

## 2019-01-04 DIAGNOSIS — I456 Pre-excitation syndrome: Secondary | ICD-10-CM | POA: Diagnosis not present

## 2019-01-04 DIAGNOSIS — R002 Palpitations: Secondary | ICD-10-CM | POA: Diagnosis not present

## 2019-01-04 DIAGNOSIS — E785 Hyperlipidemia, unspecified: Secondary | ICD-10-CM | POA: Diagnosis not present

## 2019-01-04 DIAGNOSIS — M5412 Radiculopathy, cervical region: Secondary | ICD-10-CM | POA: Diagnosis not present

## 2019-01-04 DIAGNOSIS — E063 Autoimmune thyroiditis: Secondary | ICD-10-CM | POA: Diagnosis not present

## 2019-01-04 DIAGNOSIS — Z9181 History of falling: Secondary | ICD-10-CM | POA: Diagnosis not present

## 2019-01-04 DIAGNOSIS — K219 Gastro-esophageal reflux disease without esophagitis: Secondary | ICD-10-CM | POA: Diagnosis not present

## 2019-01-06 DIAGNOSIS — Z1231 Encounter for screening mammogram for malignant neoplasm of breast: Secondary | ICD-10-CM | POA: Diagnosis not present

## 2019-01-11 DIAGNOSIS — K219 Gastro-esophageal reflux disease without esophagitis: Secondary | ICD-10-CM | POA: Diagnosis not present

## 2019-01-11 DIAGNOSIS — R1310 Dysphagia, unspecified: Secondary | ICD-10-CM | POA: Diagnosis not present

## 2019-01-24 DIAGNOSIS — R1310 Dysphagia, unspecified: Secondary | ICD-10-CM | POA: Diagnosis not present

## 2019-01-24 DIAGNOSIS — K222 Esophageal obstruction: Secondary | ICD-10-CM | POA: Diagnosis not present

## 2019-01-24 DIAGNOSIS — K449 Diaphragmatic hernia without obstruction or gangrene: Secondary | ICD-10-CM | POA: Diagnosis not present

## 2019-02-10 DIAGNOSIS — R1310 Dysphagia, unspecified: Secondary | ICD-10-CM | POA: Diagnosis not present

## 2019-02-10 DIAGNOSIS — K228 Other specified diseases of esophagus: Secondary | ICD-10-CM | POA: Diagnosis not present

## 2019-02-10 DIAGNOSIS — K449 Diaphragmatic hernia without obstruction or gangrene: Secondary | ICD-10-CM | POA: Diagnosis not present

## 2019-02-21 DIAGNOSIS — R1310 Dysphagia, unspecified: Secondary | ICD-10-CM | POA: Diagnosis not present

## 2019-09-04 DIAGNOSIS — G2581 Restless legs syndrome: Secondary | ICD-10-CM | POA: Diagnosis not present

## 2019-09-04 DIAGNOSIS — Z6825 Body mass index (BMI) 25.0-25.9, adult: Secondary | ICD-10-CM | POA: Diagnosis not present

## 2019-09-04 DIAGNOSIS — E785 Hyperlipidemia, unspecified: Secondary | ICD-10-CM | POA: Diagnosis not present

## 2019-09-04 DIAGNOSIS — M5412 Radiculopathy, cervical region: Secondary | ICD-10-CM | POA: Diagnosis not present

## 2019-09-04 DIAGNOSIS — K219 Gastro-esophageal reflux disease without esophagitis: Secondary | ICD-10-CM | POA: Diagnosis not present

## 2019-09-04 DIAGNOSIS — E063 Autoimmune thyroiditis: Secondary | ICD-10-CM | POA: Diagnosis not present

## 2019-09-04 DIAGNOSIS — I456 Pre-excitation syndrome: Secondary | ICD-10-CM | POA: Diagnosis not present

## 2019-09-04 DIAGNOSIS — R002 Palpitations: Secondary | ICD-10-CM | POA: Diagnosis not present

## 2019-09-04 DIAGNOSIS — F329 Major depressive disorder, single episode, unspecified: Secondary | ICD-10-CM | POA: Diagnosis not present

## 2019-11-02 DIAGNOSIS — M1712 Unilateral primary osteoarthritis, left knee: Secondary | ICD-10-CM | POA: Diagnosis not present

## 2019-11-02 DIAGNOSIS — M25562 Pain in left knee: Secondary | ICD-10-CM | POA: Diagnosis not present

## 2019-11-20 DIAGNOSIS — I493 Ventricular premature depolarization: Secondary | ICD-10-CM | POA: Insufficient documentation

## 2019-11-20 DIAGNOSIS — I499 Cardiac arrhythmia, unspecified: Secondary | ICD-10-CM | POA: Insufficient documentation

## 2019-11-20 DIAGNOSIS — M1990 Unspecified osteoarthritis, unspecified site: Secondary | ICD-10-CM | POA: Insufficient documentation

## 2019-11-20 DIAGNOSIS — F32A Depression, unspecified: Secondary | ICD-10-CM | POA: Insufficient documentation

## 2019-11-20 DIAGNOSIS — I456 Pre-excitation syndrome: Secondary | ICD-10-CM | POA: Insufficient documentation

## 2019-11-21 ENCOUNTER — Encounter: Payer: Self-pay | Admitting: Cardiology

## 2019-11-21 ENCOUNTER — Other Ambulatory Visit: Payer: Self-pay

## 2019-11-21 ENCOUNTER — Ambulatory Visit: Payer: Medicare HMO | Admitting: Cardiology

## 2019-11-21 VITALS — BP 138/72 | HR 64 | Ht 67.0 in | Wt 163.0 lb

## 2019-11-21 DIAGNOSIS — I493 Ventricular premature depolarization: Secondary | ICD-10-CM | POA: Diagnosis not present

## 2019-11-21 DIAGNOSIS — E039 Hypothyroidism, unspecified: Secondary | ICD-10-CM | POA: Diagnosis not present

## 2019-11-21 DIAGNOSIS — Z01810 Encounter for preprocedural cardiovascular examination: Secondary | ICD-10-CM

## 2019-11-21 HISTORY — DX: Encounter for preprocedural cardiovascular examination: Z01.810

## 2019-11-21 NOTE — Progress Notes (Signed)
Cardiology Office Note:    Date:  11/21/2019   ID:  Laura Singleton, DOB 05-07-1945, MRN [id number]  PCP:  Ocie Doyne., MD (Inactive)  Cardiologist:  Jenean Lindau, MD   Referring MD: No ref. provider found    ASSESSMENT:    1. PVC's (premature ventricular contractions)   2. Preoperative cardiovascular examination    PLAN:    In order of problems listed above:  1. Primary prevention stressed with the patient.  Importance of compliance with diet medication stressed and she vocalized understanding. 2. Preoperative cardiovascular risk assessment: I discussed this with the patient extensively.  She has risk factors for coronary artery disease and therefore we will do a Lexiscan sestamibi.  If this is negative then she is not at high risk for coronary events during the aforementioned surgery.  Meticulous hemodynamic monitoring will further reduce the risk of coronary events. 3. History of PVCs on flecainide: EKG today was normal.  I recommend continuing uninterrupted flecainide.  Subsequently I will set her up with our electrophysiologist for need to continue this on a long-term basis.  I discussed this with her. 4. Patient and daughter had multiple questions which were answered to their satisfaction.   Medication Adjustments/Labs and Tests Ordered: Current medicines are reviewed at length with the patient today.  Concerns regarding medicines are outlined above.  No orders of the defined types were placed in this encounter.  No orders of the defined types were placed in this encounter.    History of Present Illness:    Laura Singleton is a 74 y.o. female who is being seen today for the evaluation of preoperative cardiovascular evaluation at the request of orthopedic surgery.  Patient is a pleasant 74 year old female.  She has had history of PVCs.  For this she was on flecainide for a long time.  She denies any chest pain orthopnea or PND.  She has no history of hypertension dyslipidemia  diabetes mellitus.  She takes care of activities of daily living.  Because of orthopedic issues she leads a sedentary lifestyle.  At the time of my evaluation, the patient is alert awake oriented and in no distress.  Her daughter accompanies her for this visit.  Past Medical History:  Diagnosis Date  . Arthritis   . Chronic pain of left knee 11/17/2018  . Depression   . Dysrhythmia   . GERD (gastroesophageal reflux disease)   . Hyperlipidemia 11/05/2016  . Hypothyroidism   . Myelopathy of cervical spinal cord with cervical radiculopathy 11/05/2016  . PVC (premature ventricular contraction) 11/05/2016  . PVC's (premature ventricular contractions)   . Wolff-Parkinson-White (WPW) syndrome   . WPW (Wolff-Parkinson-White syndrome) 11/05/2016    Past Surgical History:  Procedure Laterality Date  . ANTERIOR CERVICAL DECOMP/DISCECTOMY FUSION N/A 11/05/2016   Procedure: ANTERIOR CERVICAL DECOMPRESSION/DISCECTOMY FUSION  - CERVICAL THREE-FOUR, CERVICAL FIVE-SIX, CERVICAL SIX -SEVEN;  Surgeon: Kary Kos, MD;  Location: [facility];  Service: Neurosurgery;  Laterality: N/A;  . COLONOSCOPY    . EYE SURGERY     both eyes lens implants  . TONSILLECTOMY    . TUBAL LIGATION      Current Medications: Current Meds  Medication Sig  . atorvastatin (LIPITOR) 20 MG tablet Take 20 mg by mouth at bedtime.  . fenoprofen (NALFON) 600 MG TABS tablet Take 600 mg by mouth daily.  . flecainide (TAMBOCOR) 100 MG tablet Take 100 mg by mouth 2 (two) times daily.    Marland Kitchen ibuprofen (ADVIL) 200 MG tablet Take 200  mg by mouth every 6 (six) hours as needed.  Marland Kitchen levothyroxine (SYNTHROID) 100 MCG tablet Take 100 mcg by mouth daily before breakfast.  . Multiple Vitamin (MULTIVITAMIN WITH MINERALS) TABS tablet Take 1 tablet by mouth daily. Centrum Silver  . omeprazole (PRILOSEC) 40 MG capsule Take 40 mg by mouth daily.  Marland Kitchen rOPINIRole (REQUIP) 1 MG tablet Take 4 tablets by mouth at bedtime.  . sertraline (ZOLOFT) 100 MG tablet Take 200  mg by mouth daily.  . [DISCONTINUED] esomeprazole (NEXIUM) 40 MG capsule Take 40 mg by mouth daily before breakfast.       Allergies:   Bee venom, Parafon forte dsc [chlorzoxazone], and Sulfa antibiotics   Social History   Socioeconomic History  . Marital status: Married    Spouse name: Not on file  . Number of children: Not on file  . Years of education: Not on file  . Highest education level: Not on file  Occupational History  . Not on file  Tobacco Use  . Smoking status: Former Smoker    Types: Cigarettes  . Smokeless tobacco: Never Used  Vaping Use  . Vaping Use: Never used  Substance and Sexual Activity  . Alcohol use: No  . Drug use: No  . Sexual activity: Not on file  Other Topics Concern  . Not on file  Social History Narrative  . Not on file   Social Determinants of Health   Financial Resource Strain:   . Difficulty of Paying Living Expenses: Not on file  Food Insecurity:   . Worried About Charity fundraiser in the Last Year: Not on file  . Ran Out of Food in the Last Year: Not on file  Transportation Needs:   . Lack of Transportation (Medical): Not on file  . Lack of Transportation (Non-Medical): Not on file  Physical Activity:   . Days of Exercise per Week: Not on file  . Minutes of Exercise per Session: Not on file  Stress:   . Feeling of Stress : Not on file  Social Connections:   . Frequency of Communication with Friends and Family: Not on file  . Frequency of Social Gatherings with Friends and Family: Not on file  . Attends Religious Services: Not on file  . Active Member of Clubs or Organizations: Not on file  . Attends Archivist Meetings: Not on file  . Marital Status: Not on file     Family History: The patient's family history includes High Cholesterol in her mother; High blood pressure in her brother and mother.  ROS:   Please see the history of present illness.    All other systems reviewed and are  negative.  EKGs/Labs/Other Studies Reviewed:    The following studies were reviewed today: EKG reveals sinus rhythm and nonspecific ST-T changes.   Recent Labs: No results found for requested labs within last 8760 hours.  Recent Lipid Panel No results found for: CHOL, TRIG, HDL, CHOLHDL, VLDL, LDLCALC, LDLDIRECT  Physical Exam:    VS:  BP 138/72   Pulse 64   Ht 5\' 7"  (1.702 m)   Wt 163 lb (73.9 kg)   SpO2 97%   BMI 25.53 kg/m     Wt Readings from Last 3 Encounters:  11/21/19 163 lb (73.9 kg)  11/06/16 163 lb (73.9 kg)  11/04/16 163 lb 9.6 oz (74.2 kg)     GEN: Patient is in no acute distress HEENT: Normal NECK: No JVD; No carotid bruits LYMPHATICS: No lymphadenopathy  CARDIAC: S1 S2 regular, 2/6 systolic murmur at the apex. RESPIRATORY:  Clear to auscultation without rales, wheezing or rhonchi  ABDOMEN: Soft, non-tender, non-distended MUSCULOSKELETAL:  No edema; No deformity  SKIN: Warm and dry NEUROLOGIC:  Alert and oriented x 3 PSYCHIATRIC:  Normal affect    Signed, Jenean Lindau, MD  11/21/2019 10:12 AM    [place]

## 2019-11-21 NOTE — Patient Instructions (Signed)
Medication Instructions:  No medication changes. *If you need a refill on your cardiac medications before your next appointment, please call your pharmacy*   Lab Work: Your physician recommends that you have labs done in the office today. Your test included  basic metabolic panel, liver function test and magnesium level.  If you have labs (blood work) drawn today and your tests are completely normal, you will receive your results only by: Marland Kitchen MyChart Message (if you have MyChart) OR . A paper copy in the mail If you have any lab test that is abnormal or we need to change your treatment, we will call you to review the results.   Testing/Procedures: Your physician has requested that you have a lexiscan myoview. For further information please visit [email or web address]. Please follow instruction sheet, as given.  The test will take approximately 3 to 4 hours to complete; you may bring reading material.  If someone comes with you to your appointment, they will need to remain in the main lobby due to limited space in the testing area.   How to prepare for your Myocardial Perfusion Test: . Do not eat or drink 3 hours prior to your test, except you may have water. . Do not consume products containing caffeine (regular or decaffeinated) 12 hours prior to your test. (ex: coffee, chocolate, sodas, tea). . Do bring a list of your current medications with you.  If not listed below, you may take your medications as normal. . Do wear comfortable clothes (no dresses or overalls) and walking shoes, tennis shoes preferred (No heels or open toe shoes are allowed). . Do NOT wear cologne, perfume, aftershave, or lotions (deodorant is allowed). . If these instructions are not followed, your test will have to be rescheduled.    Follow-Up: At [facility] you and your health needs are our priority.  As part of our continuing mission to provide you with exceptional heart care, we have created designated  Provider Care Teams.  These Care Teams include your primary Cardiologist (physician) and Advanced Practice Providers (APPs -  Physician Assistants and Nurse Practitioners) who all work together to provide you with the care you need, when you need it.  We recommend signing up for the patient portal called "MyChart".  Sign up information is provided on this After Visit Summary.  MyChart is used to connect with patients for Virtual Visits (Telemedicine).  Patients are able to view lab/test results, encounter notes, upcoming appointments, etc.  Non-urgent messages can be sent to your provider as well.   To learn more about what you can do with MyChart, go to [email or web address].    Your next appointment:   6 month(s)  The format for your next appointment:   In Person  Provider:   [name], MD   Other Instructions  Cardiac Nuclear Scan A cardiac nuclear scan is a test that is done to check the flow of blood to your heart. It is done when you are resting and when you are exercising. The test looks for problems such as:  Not enough blood reaching a portion of the heart.  The heart muscle not working as it should. You may need this test if:  You have heart disease.  You have had lab results that are not normal.  You have had heart surgery or a balloon procedure to open up blocked arteries (angioplasty).  You have chest pain.  You have shortness of breath. In this test, a special dye (tracer) is put into your  bloodstream. The tracer will travel to your heart. A camera will then take pictures of your heart to see how the tracer moves through your heart. This test is usually done at a hospital and takes 2-4 hours. Tell a doctor about:  Any allergies you have.  All medicines you are taking, including vitamins, herbs, eye drops, creams, and over-the-counter medicines.  Any problems you or family members have had with anesthetic medicines.  Any blood disorders you have.  Any  surgeries you have had.  Any medical conditions you have.  Whether you are pregnant or may be pregnant. What are the risks? Generally, this is a safe test. However, problems may occur, such as:  Serious chest pain and heart attack. This is only a risk if the stress portion of the test is done.  Rapid heartbeat.  A feeling of warmth in your chest. This feeling usually does not last long.  Allergic reaction to the tracer. What happens before the test?  Ask your doctor about changing or stopping your normal medicines. This is important.  Follow instructions from your doctor about what you cannot eat or drink.  Remove your jewelry on the day of the test. What happens during the test?  An IV tube will be inserted into one of your veins.  Your doctor will give you a small amount of tracer through the IV tube.  You will wait for 20-40 minutes while the tracer moves through your bloodstream.  Your heart will be monitored with an electrocardiogram (ECG).  You will lie down on an exam table.  Pictures of your heart will be taken for about 15-20 minutes.  You may also have a stress test. For this test, one of these things may be done: ? You will be asked to exercise on a treadmill or a stationary bike. ? You will be given medicines that will make your heart work harder. This is done if you are unable to exercise.  When blood flow to your heart has peaked, a tracer will again be given through the IV tube.  After 20-40 minutes, you will get back on the exam table. More pictures will be taken of your heart.  Depending on the tracer that is used, more pictures may need to be taken 3-4 hours later.  Your IV tube will be removed when the test is over. The test may vary among doctors and hospitals. What happens after the test?  Ask your doctor: ? Whether you can return to your normal schedule, including diet, activities, and medicines. ? Whether you should drink more fluids. This  will help to remove the tracer from your body. Drink enough fluid to keep your pee (urine) pale yellow.  Ask your doctor, or the department that is doing the test: ? When will my results be ready? ? How will I get my results? Summary  A cardiac nuclear scan is a test that is done to check the flow of blood to your heart.  Tell your doctor whether you are pregnant or may be pregnant.  Before the test, ask your doctor about changing or stopping your normal medicines. This is important.  Ask your doctor whether you can return to your normal activities. You may be asked to drink more fluids. This information is not intended to replace advice given to you by your health care provider. Make sure you discuss any questions you have with your health care provider. Document Revised: 06/15/2018 Document Reviewed: 08/09/2017 Elsevier Patient Education  [phone]  [employer].

## 2019-11-22 LAB — BASIC METABOLIC PANEL
BUN/Creatinine Ratio: 16 (ref 12–28)
BUN: 17 mg/dL (ref 8–27)
CO2: 24 mmol/L (ref 20–29)
Calcium: 10.2 mg/dL (ref 8.7–10.3)
Chloride: 102 mmol/L (ref 96–106)
Creatinine, Ser: 1.05 mg/dL — ABNORMAL HIGH (ref 0.57–1.00)
GFR calc Af Amer: 61 mL/min/{1.73_m2} (ref >59)
GFR calc non Af Amer: 53 mL/min/{1.73_m2} — ABNORMAL LOW (ref >59)
Glucose: 77 mg/dL (ref 65–99)
Potassium: 4.8 mmol/L (ref 3.5–5.2)
Sodium: 139 mmol/L (ref 134–144)

## 2019-11-22 LAB — TSH: TSH: 5.63 u[IU]/mL — ABNORMAL HIGH (ref 0.450–4.500)

## 2019-11-22 LAB — MAGNESIUM: Magnesium: 2.3 mg/dL (ref 1.6–2.3)

## 2019-11-29 ENCOUNTER — Telehealth (HOSPITAL_COMMUNITY): Payer: Self-pay | Admitting: *Deleted

## 2019-11-29 NOTE — Telephone Encounter (Signed)
Patient given detailed instructions per Myocardial Perfusion Study Information Sheet for the test on 12/05/19. Patient notified to arrive 15 minutes early and that it is imperative to arrive on time for appointment to keep from having the test rescheduled.  If you need to cancel or reschedule your appointment, please call the office within 24 hours of your appointment. . Patient verbalized understanding. Laura Singleton    

## 2019-12-01 DIAGNOSIS — M25562 Pain in left knee: Secondary | ICD-10-CM | POA: Diagnosis not present

## 2019-12-01 DIAGNOSIS — M1712 Unilateral primary osteoarthritis, left knee: Secondary | ICD-10-CM | POA: Diagnosis not present

## 2019-12-05 ENCOUNTER — Other Ambulatory Visit: Payer: Self-pay

## 2019-12-05 ENCOUNTER — Ambulatory Visit (INDEPENDENT_AMBULATORY_CARE_PROVIDER_SITE_OTHER): Payer: Medicare HMO

## 2019-12-05 DIAGNOSIS — I493 Ventricular premature depolarization: Secondary | ICD-10-CM | POA: Diagnosis not present

## 2019-12-05 DIAGNOSIS — Z01810 Encounter for preprocedural cardiovascular examination: Secondary | ICD-10-CM | POA: Diagnosis not present

## 2019-12-05 LAB — MYOCARDIAL PERFUSION IMAGING
LV dias vol: 73 mL (ref 46–106)
LV sys vol: 26 mL
Peak HR: 74 {beats}/min
Rest HR: 56 {beats}/min
SDS: 0
SRS: 0
SSS: 0
TID: 1.04

## 2019-12-05 MED ORDER — TECHNETIUM TC 99M TETROFOSMIN IV KIT
31.8000 | PACK | Freq: Once | INTRAVENOUS | Status: AC | PRN
  Administered 2019-12-05: 31.8 via INTRAVENOUS

## 2019-12-05 MED ORDER — TECHNETIUM TC 99M TETROFOSMIN IV KIT
10.1000 | PACK | Freq: Once | INTRAVENOUS | Status: AC | PRN
  Administered 2019-12-05: 10.1 via INTRAVENOUS

## 2019-12-05 MED ORDER — REGADENOSON 0.4 MG/5ML IV SOLN
0.4000 mg | Freq: Once | INTRAVENOUS | Status: AC
  Administered 2019-12-05: 0.4 mg via INTRAVENOUS

## 2019-12-08 ENCOUNTER — Telehealth: Payer: Self-pay

## 2019-12-08 NOTE — Telephone Encounter (Signed)
   [facility] Pre-operative Risk Assessment    HEARTCARE STAFF: - Please ensure there is not already an duplicate clearance open for this procedure. - Under Visit Info/Reason for Call, type in Other and utilize the format Clearance MM/DD/YY or Clearance TBD. Do not use dashes or single digits. - If request is for dental extraction, please clarify the # of teeth to be extracted.  Request for surgical clearance:  1. What type of surgery is being performed? Left total knee arthroplasty   2. When is this surgery scheduled? 12-26-19   3. What type of clearance is required (medical clearance vs. Pharmacy clearance to hold med vs. Both)? Medical  4. Are there any medications that need to be held prior to surgery and how long?   5. Practice name and name of physician performing surgery? [facility]- Dr. Paralee Cancel   6. What is the office phone number? [phone]   7.   What is the office fax number? [phone]  8.   Anesthesia type (None, local, MAC, general) ? Spinal   Laura Singleton 12/08/2019, 2:30 PM  _________________________________________________________________   (provider comments below)

## 2019-12-08 NOTE — Telephone Encounter (Signed)
   Primary Cardiologist: No primary care provider on file.  Chart reviewed as part of pre-operative protocol coverage. Given past medical history and time since last visit, based on ACC/AHA guidelines, Laura Singleton would be at acceptable risk for the planned procedure without further cardiovascular testing.   Patient was advised that if she develops new symptoms prior to surgery to contact our office to arrange a follow-up appointment.  She verbalized understanding.   I will route this recommendation to the requesting party via Epic fax function and remove from pre-op pool.  Please call with questions.  Jossie Ng. Meade Hogeland NP-C    12/08/2019, 2:51 PM [facility] [place] [address] Office [phone] Fax [phone]

## 2019-12-15 NOTE — H&P (Signed)
TOTAL KNEE ADMISSION H&P  Patient is being admitted for left total knee arthroplasty.  Subjective:  Chief Complaint:   Left knee OA / pain  HPI: Laura Singleton, 74 y.o. female, has a history of pain and functional disability in the left knee due to  arthritis and has failed non-surgical conservative treatments for greater than 12 weeks to include NSAID's and/or analgesics, corticosteriod injections, use of assistive devices and activity modification.  Onset of symptoms was gradual, starting years ago with gradually worsening course since that time. The patient noted no past surgery on the left knee(s).  Patient currently rates pain in the left knee(s) at 9 out of 10 with activity. Patient has night pain, worsening of pain with activity and weight bearing, pain that interferes with activities of daily living, pain with passive range of motion, crepitus and joint swelling.  Patient has evidence of periarticular osteophytes and joint space narrowing by imaging studies.  There is no active infection.  Risks, benefits and expectations were discussed with the patient.  Risks including but not limited to the risk of anesthesia, blood clots, nerve damage, blood vessel damage, failure of the prosthesis, infection and up to and including death.  Discussed the possibility of nerve damage to the  peroneal nerve and the potential for drop foot, especially since she has a pre-op valgus deformity.  Patient understand the risks, benefits and expectations and wishes to proceed with surgery.   PCP: Penelope Coop, FNP  D/C Plans:       Home   Post-op Meds:       No Rx given  Tranexamic Acid:      To be given - IV   Decadron:      Is to be given  FYI:     Xarelto (No ASA)  Norco  DME:   Pt already has equipment  PT:   [facility]    Patient Active Problem List   Diagnosis Date Noted  . Preoperative cardiovascular examination 11/21/2019  . Wolff-Parkinson-White (WPW) syndrome    . PVC's (premature ventricular contractions)   . Hypothyroidism   . GERD (gastroesophageal reflux disease)   . Dysrhythmia   . Depression   . Arthritis   . Chronic pain of left knee 11/17/2018  . WPW (Wolff-Parkinson-White syndrome) 11/05/2016  . PVC (premature ventricular contraction) 11/05/2016  . Hyperlipidemia 11/05/2016  . Myelopathy of cervical spinal cord with cervical radiculopathy ([place]) 11/05/2016   Past Medical History:  Diagnosis Date  . Arthritis   . Chronic pain of left knee 11/17/2018  . Depression   . Dysrhythmia   . GERD (gastroesophageal reflux disease)   . Hyperlipidemia 11/05/2016  . Hypothyroidism   . Myelopathy of cervical spinal cord with cervical radiculopathy 11/05/2016  . PVC (premature ventricular contraction) 11/05/2016  . PVC's (premature ventricular contractions)   . Wolff-Parkinson-White (WPW) syndrome   . WPW (Wolff-Parkinson-White syndrome) 11/05/2016    Past Surgical History:  Procedure Laterality Date  . ANTERIOR CERVICAL DECOMP/DISCECTOMY FUSION N/A 11/05/2016   Procedure: ANTERIOR CERVICAL DECOMPRESSION/DISCECTOMY FUSION  - CERVICAL THREE-FOUR, CERVICAL FIVE-SIX, CERVICAL SIX -SEVEN;  Surgeon: Kary Kos, MD;  Location: [facility];  Service: Neurosurgery;  Laterality: N/A;  . COLONOSCOPY    . EYE SURGERY     both eyes lens implants  . TONSILLECTOMY    . TUBAL LIGATION      No current facility-administered medications for this encounter.   Current Outpatient Medications  Medication Sig Dispense Refill  Last Dose  . atorvastatin (LIPITOR) 20 MG tablet Take 20 mg by mouth at bedtime.     . Calcium Carbonate-Vitamin D (CALCIUM 600/VITAMIN D PO) Take 1 tablet by mouth daily.     . flecainide (TAMBOCOR) 100 MG tablet Take 100 mg by mouth 2 (two) times daily.       Marland Kitchen ibuprofen (ADVIL) 200 MG tablet Take 800 mg by mouth every 6 (six) hours as needed.      Marland Kitchen levothyroxine (SYNTHROID) 100 MCG tablet Take 100 mcg by mouth daily before breakfast.     .  Multiple Vitamin (MULTIVITAMIN WITH MINERALS) TABS tablet Take 1 tablet by mouth daily. Centrum Silver     . omeprazole (PRILOSEC) 40 MG capsule Take 40 mg by mouth daily.     Marland Kitchen rOPINIRole (REQUIP) 1 MG tablet Take 4 mg by mouth at bedtime.      . sertraline (ZOLOFT) 100 MG tablet Take 200 mg by mouth daily.     . fenoprofen (NALFON) 600 MG TABS tablet Take 600 mg by mouth daily. (Patient not taking: Reported on 12/07/2019)   Not Taking at Unknown time   Allergies  Allergen Reactions  . Bee Venom Itching, Swelling and Other (See Comments)    Passed out  . Parafon Forte Dsc [Chlorzoxazone] Swelling and Other (See Comments)    Lips swelling and lips felt sunburned.  . Sulfa Antibiotics Hives and Itching    Social History   Tobacco Use  . Smoking status: Former Smoker    Types: Cigarettes  . Smokeless tobacco: Never Used  Substance Use Topics  . Alcohol use: No    Family History  Problem Relation Age of Onset  . High Cholesterol Mother   . High blood pressure Mother   . High blood pressure Brother      Review of Systems  Constitutional: Negative.   HENT: Negative.   Eyes: Negative.   Respiratory: Negative.   Cardiovascular: Negative.   Gastrointestinal: Positive for heartburn.  Genitourinary: Negative.   Musculoskeletal: Positive for joint pain.  Skin: Negative.   Neurological: Negative.   Endo/Heme/Allergies: Negative.   Psychiatric/Behavioral: Negative.      Objective:  Physical Exam Constitutional:      Appearance: She is well-developed.  HENT:     Head: Normocephalic.  Eyes:     Pupils: Pupils are equal, round, and reactive to light.  Neck:     Thyroid: No thyromegaly.     Vascular: No JVD.     Trachea: No tracheal deviation.  Cardiovascular:     Rate and Rhythm: Normal rate and regular rhythm.  Pulmonary:     Effort: Pulmonary effort is normal. No respiratory distress.     Breath sounds: Normal breath sounds. No wheezing.  Abdominal:     Palpations:  Abdomen is soft.     Tenderness: There is no abdominal tenderness. There is no guarding.  Musculoskeletal:     Cervical back: Neck supple.     Left knee: Swelling, bony tenderness and crepitus present. No erythema or ecchymosis. Decreased range of motion. Tenderness present.  Lymphadenopathy:     Cervical: No cervical adenopathy.  Skin:    General: Skin is warm and dry.  Neurological:     Mental Status: She is alert and oriented to person, place, and time.     Sensory: Sensory deficit present.       Labs:  Estimated body mass index is 25.53 kg/m as calculated from the following:   Height as  of 12/05/19: 5\' 7"  (1.702 m).   Weight as of 12/05/19: 73.9 kg.   Imaging Review Plain radiographs demonstrate severe degenerative joint disease of the left knee. The overall alignment is significant valgus. The bone quality appears to be good for age and reported activity level.      Assessment/Plan:  End stage arthritis, left knee   The patient history, physical examination, clinical judgment of the provider and imaging studies are consistent with end stage degenerative joint disease of the left knee(s) and total knee arthroplasty is deemed medically necessary. The treatment options including medical management, injection therapy arthroscopy and arthroplasty were discussed at length. The risks and benefits of total knee arthroplasty were presented and reviewed. The risks due to aseptic loosening, infection, stiffness, patella tracking problems, thromboembolic complications and other imponderables were discussed. The patient acknowledged the explanation, agreed to proceed with the plan and consent was signed. Patient is being admitted for treatment for surgery, pain control, PT, OT, prophylactic antibiotics, VTE prophylaxis, progressive ambulation and ADL's and discharge planning. The patient is planning to be discharged home.     Patient's anticipated LOS is less than 2 midnights, meeting  these requirements: - Lives within 1 hour of care - Has a competent adult at home to recover with post-op recover - NO history of  - Chronic pain requiring opiods  - Diabetes  - Coronary Artery Disease  - Heart failure  - Heart attack  - Stroke  - DVT/VTE  - Respiratory Failure/COPD  - Renal failure  - Anemia  - Advanced Liver disease    [place] Pugh. Saliha Salts   PA-C  12/24/2019, 3:28 PM

## 2019-12-18 NOTE — Progress Notes (Addendum)
COVID Vaccine Completed:  No Date COVID Vaccine completed: COVID vaccine manufacturer: [place]   PCP - Marlena Clipper, FNP Cardiologist -  Dr. Bettina Gavia in [place]  Clearance in [place] dated 12-08-19 by Coletta Memos, NP-C  Chest x-ray -  EKG - 11-21-19 in Epic Stress Test - 12-05-19 in Epic ECHO -  Cardiac Cath -  Pacemaker/ICD device last checked:  Sleep Study -  CPAP -   Fasting Blood Sugar -  Checks Blood Sugar _____ times a day  Blood Thinner Instructions: Aspirin Instructions: Last Dose:  Anesthesia review: Wolf-Parkinson-White syndrome, PVC  Patient denies shortness of breath, fever, cough and chest pain at PAT appointment   Patient verbalized understanding of instructions that were given to them at the PAT appointment. Patient was also instructed that they will need to review over the PAT instructions again at home before surgery.

## 2019-12-18 NOTE — Patient Instructions (Addendum)
DUE TO COVID-19 ONLY ONE VISITOR IS ALLOWED TO COME WITH YOU AND STAY IN THE WAITING ROOM ONLY  DURING PRE OP AND PROCEDURE.   IF YOU WILL BE ADMITTED INTO THE HOSPITAL YOU ARE ALLOWED ONE SUPPORT PERSON DURING  VISITATION HOURS ONLY (10AM -8PM)   . The support person may change daily. . The support person must pass our screening, gel in and out, and wear a mask at all times, including in the patient's room. . Patients must also wear a mask when staff or their support person are in the room.   COVID SWAB TESTING MUST BE COMPLETED ON:  Friday, 12-22-19 @ 8:25 AM   [address] [place],  [address]  (Must self quarantine after testing. Follow instructions on  Handout.)  Testing site is a drive thru, get in surgery testing line.        Your procedure is scheduled on: Tuesday, 12-26-19   Report to [facility] Main  Entrance   Report to Short Stay at 5:30 AM   [facility])   Call this number if you have problems the morning of surgery [phone]   Do not eat food :After Midnight.   May have liquids until 4:15 am day of surgery  CLEAR LIQUID DIET  Foods Allowed                                                                     Foods Excluded  Water, Black Coffee and tea, regular and decaf               liquids that you cannot  Plain Jell-O in any flavor  (No red)                                     see through such as: Fruit ices (not with fruit pulp)                                      milk, soups, orange juice              Iced Popsicles (No red)                                      All solid food                                   Apple juices Sports drinks like Gatorade (No red) Lightly seasoned clear broth or consume(fat free) Sugar, honey syrup   Complete one Ensure drink the morning of surgery at   4:15 AM  the day of surgery.    Oral Hygiene is also important to reduce your risk of infection.                                    Remember - BRUSH YOUR  TEETH THE  MORNING OF SURGERY WITH YOUR REGULAR TOOTHPASTE   Do NOT smoke after Midnight   Take these medicines the morning of surgery with A SIP OF WATER:  Flecainide, Levothyroxine, Omeprazole, Sertraline                                You may not have any metal on your body including hair pins, jewelry, and body piercings             Do not wear make-up, lotions, powders, perfumes/cologne, or deodorant             Do not wear nail polish.  Do not shave  48 hours prior to surgery.    Do not bring valuables to the hospital. [place].   Contacts, dentures or bridgework may not be worn into surgery.   Bring small overnight bag day of surgery.                 Please read over the following fact sheets you were given: IF YOU HAVE QUESTIONS ABOUT YOUR PRE OP INSTRUCTIONS PLEASE CALL [phone]   [facility] - Preparing for Surgery Before surgery, you can play an important role.  Because skin is not sterile, your skin needs to be as free of germs as possible.  You can reduce the number of germs on your skin by washing with CHG (chlorahexidine gluconate) soap before surgery.  CHG is an antiseptic cleaner which kills germs and bonds with the skin to continue killing germs even after washing. Please DO NOT use if you have an allergy to CHG or antibacterial soaps.  If your skin becomes reddened/irritated stop using the CHG and inform your nurse when you arrive at Short Stay. Do not shave (including legs and underarms) for at least 48 hours prior to the first CHG shower.  You may shave your face/neck.  Please follow these instructions carefully:  1.  Shower with CHG Soap the night before surgery and the  morning of surgery.  2.  If you choose to wash your hair, wash your hair first as usual with your normal  shampoo.  3.  After you shampoo, rinse your hair and body thoroughly to remove the shampoo.                             4.  Use CHG as you would any  other liquid soap.  You can apply chg directly to the skin and wash.  Gently with a scrungie or clean washcloth.  5.  Apply the CHG Soap to your body ONLY FROM THE NECK DOWN.   Do   not use on face/ open                           Wound or open sores. Avoid contact with eyes, ears mouth and   genitals (private parts).                       Wash face,  Genitals (private parts) with your normal soap.             6.  Wash thoroughly, paying special attention to the area where your    surgery  will be performed.  7.  Thoroughly rinse your body with warm water from the  neck down.  8.  DO NOT shower/wash with your normal soap after using and rinsing off the CHG Soap.                9.  Pat yourself dry with a clean towel.            10.  Wear clean pajamas.            11.  Place clean sheets on your bed the night of your first shower and do not  sleep with pets. Day of Surgery : Do not apply any lotions/deodorants the morning of surgery.  Please wear clean clothes to the hospital/surgery center.  FAILURE TO FOLLOW THESE INSTRUCTIONS MAY RESULT IN THE CANCELLATION OF YOUR SURGERY  PATIENT SIGNATURE_________________________________  NURSE SIGNATURE__________________________________  ________________________________________________________________________   Laura Singleton  An incentive spirometer is a tool that can help keep your lungs clear and active. This tool measures how well you are filling your lungs with each breath. Taking long deep breaths may help reverse or decrease the chance of developing breathing (pulmonary) problems (especially infection) following:  A long period of time when you are unable to move or be active. BEFORE THE PROCEDURE   If the spirometer includes an indicator to show your best effort, your nurse or respiratory therapist will set it to a desired goal.  If possible, sit up straight or lean slightly forward. Try not to slouch.  Hold the incentive spirometer  in an upright position. INSTRUCTIONS FOR USE  1. Sit on the edge of your bed if possible, or sit up as far as you can in bed or on a chair. 2. Hold the incentive spirometer in an upright position. 3. Breathe out normally. 4. Place the mouthpiece in your mouth and seal your lips tightly around it. 5. Breathe in slowly and as deeply as possible, raising the piston or the ball toward the top of the column. 6. Hold your breath for 3-5 seconds or for as long as possible. Allow the piston or ball to fall to the bottom of the column. 7. Remove the mouthpiece from your mouth and breathe out normally. 8. Rest for a few seconds and repeat Steps 1 through 7 at least 10 times every 1-2 hours when you are awake. Take your time and take a few normal breaths between deep breaths. 9. The spirometer may include an indicator to show your best effort. Use the indicator as a goal to work toward during each repetition. 10. After each set of 10 deep breaths, practice coughing to be sure your lungs are clear. If you have an incision (the cut made at the time of surgery), support your incision when coughing by placing a pillow or rolled up towels firmly against it. Once you are able to get out of bed, walk around indoors and cough well. You may stop using the incentive spirometer when instructed by your caregiver.  RISKS AND COMPLICATIONS  Take your time so you do not get dizzy or light-headed.  If you are in pain, you may need to take or ask for pain medication before doing incentive spirometry. It is harder to take a deep breath if you are having pain. AFTER USE  Rest and breathe slowly and easily.  It can be helpful to keep track of a log of your progress. Your caregiver can provide you with a simple table to help with this. If you are using the spirometer at home, follow these instructions: [place] IF:  You are having difficultly using the spirometer.  You have trouble using the spirometer as  often as instructed.  Your pain medication is not giving enough relief while using the spirometer.  You develop fever of 100.5 F (38.1 C) or higher. SEEK IMMEDIATE MEDICAL CARE IF:   You cough up bloody sputum that had not been present before.  You develop fever of 102 F (38.9 C) or greater.  You develop worsening pain at or near the incision site. MAKE SURE YOU:   Understand these instructions.  Will watch your condition.  Will get help right away if you are not doing well or get worse. Document Released: 07/06/2006 Document Revised: 05/18/2011 Document Reviewed: 09/06/2006 [employer] Patient Information 2014 Exit[employer], [place].   ________________________________________________________________________  WHAT IS A BLOOD TRANSFUSION? Blood Transfusion Information  A transfusion is the replacement of blood or some of its parts. Blood is made up of multiple cells which provide different functions.  Red blood cells carry oxygen and are used for blood loss replacement.  White blood cells fight against infection.  Platelets control bleeding.  Plasma helps clot blood.  Other blood products are available for specialized needs, such as hemophilia or other clotting disorders. BEFORE THE TRANSFUSION  Who gives blood for transfusions?   Healthy volunteers who are fully evaluated to make sure their blood is safe. This is blood bank blood. Transfusion therapy is the safest it has ever been in the practice of medicine. Before blood is taken from a donor, a complete history is taken to make sure that person has no history of diseases nor engages in risky social behavior (examples are intravenous drug use or sexual activity with multiple partners). The donor's travel history is screened to minimize risk of transmitting infections, such as malaria. The donated blood is tested for signs of infectious diseases, such as HIV and hepatitis. The blood is then tested to be sure it is compatible with  you in order to minimize the chance of a transfusion reaction. If you or a relative donates blood, this is often done in anticipation of surgery and is not appropriate for emergency situations. It takes many days to process the donated blood. RISKS AND COMPLICATIONS Although transfusion therapy is very safe and saves many lives, the main dangers of transfusion include:   Getting an infectious disease.  Developing a transfusion reaction. This is an allergic reaction to something in the blood you were given. Every precaution is taken to prevent this. The decision to have a blood transfusion has been considered carefully by your caregiver before blood is given. Blood is not given unless the benefits outweigh the risks. AFTER THE TRANSFUSION  Right after receiving a blood transfusion, you will usually feel much better and more energetic. This is especially true if your red blood cells have gotten low (anemic). The transfusion raises the level of the red blood cells which carry oxygen, and this usually causes an energy increase.  The nurse administering the transfusion will monitor you carefully for complications. HOME CARE INSTRUCTIONS  No special instructions are needed after a transfusion. You may find your energy is better. Speak with your caregiver about any limitations on activity for underlying diseases you may have. SEEK MEDICAL CARE IF:   Your condition is not improving after your transfusion.  You develop redness or irritation at the intravenous (IV) site. SEEK IMMEDIATE MEDICAL CARE IF:  Any of the following symptoms occur over the next 12 hours:  Shaking chills.  You have a temperature  by mouth above 102 F (38.9 C), not controlled by medicine.  Chest, back, or muscle pain.  People around you feel you are not acting correctly or are confused.  Shortness of breath or difficulty breathing.  Dizziness and fainting.  You get a rash or develop hives.  You have a decrease in  urine output.  Your urine turns a dark color or changes to pink, red, or brown. Any of the following symptoms occur over the next 10 days:  You have a temperature by mouth above 102 F (38.9 C), not controlled by medicine.  Shortness of breath.  Weakness after normal activity.  The white part of the eye turns yellow (jaundice).  You have a decrease in the amount of urine or are urinating less often.  Your urine turns a dark color or changes to pink, red, or brown. Document Released: 02/21/2000 Document Revised: 05/18/2011 Document Reviewed: 10/10/2007 [facility] Patient Information 2014 [place], [place].  _______________________________________________________________________

## 2019-12-20 ENCOUNTER — Other Ambulatory Visit: Payer: Self-pay

## 2019-12-20 ENCOUNTER — Encounter (HOSPITAL_COMMUNITY)
Admission: RE | Admit: 2019-12-20 | Discharge: 2019-12-20 | Disposition: A | Discharge status: Home / Self Care | Payer: Medicare HMO | Source: Ambulatory Visit | Attending: Orthopedic Surgery | Admitting: Orthopedic Surgery

## 2019-12-20 ENCOUNTER — Encounter (HOSPITAL_COMMUNITY): Payer: Self-pay

## 2019-12-20 DIAGNOSIS — Z01818 Encounter for other preprocedural examination: Secondary | ICD-10-CM | POA: Diagnosis not present

## 2019-12-20 HISTORY — DX: Unspecified malignant neoplasm of skin of unspecified part of face: C44.300

## 2019-12-20 LAB — CBC
HCT: 38.9 % (ref 36.0–46.0)
Hemoglobin: 12.7 g/dL (ref 12.0–15.0)
MCH: 30 pg (ref 26.0–34.0)
MCHC: 32.6 g/dL (ref 30.0–36.0)
MCV: 91.7 fL (ref 80.0–100.0)
Platelets: 326 10*3/uL (ref 150–400)
RBC: 4.24 MIL/uL (ref 3.87–5.11)
RDW: 13.9 % (ref 11.5–15.5)
WBC: 6.7 10*3/uL (ref 4.0–10.5)
nRBC: 0 % (ref 0.0–0.2)

## 2019-12-20 LAB — BASIC METABOLIC PANEL
Anion gap: 10 (ref 5–15)
BUN: 18 mg/dL (ref 8–23)
CO2: 27 mmol/L (ref 22–32)
Calcium: 10.1 mg/dL (ref 8.9–10.3)
Chloride: 102 mmol/L (ref 98–111)
Creatinine, Ser: 0.88 mg/dL (ref 0.44–1.00)
GFR, Estimated: >60 mL/min (ref >60)
Glucose, Bld: 86 mg/dL (ref 70–99)
Potassium: 4.6 mmol/L (ref 3.5–5.1)
Sodium: 139 mmol/L (ref 135–145)

## 2019-12-20 LAB — SURGICAL PCR SCREEN
MRSA, PCR: NEGATIVE
Staphylococcus aureus: POSITIVE — AB

## 2019-12-20 NOTE — Progress Notes (Signed)
PCR screen sent to Dr. Alvan Dame to review.

## 2019-12-22 ENCOUNTER — Other Ambulatory Visit (HOSPITAL_COMMUNITY)
Admission: RE | Admit: 2019-12-22 | Discharge: 2019-12-22 | Disposition: A | Discharge status: Home / Self Care | Payer: Medicare HMO | Source: Ambulatory Visit | Attending: Orthopedic Surgery | Admitting: Orthopedic Surgery

## 2019-12-22 DIAGNOSIS — Z01812 Encounter for preprocedural laboratory examination: Secondary | ICD-10-CM | POA: Diagnosis not present

## 2019-12-22 DIAGNOSIS — Z20822 Contact with and (suspected) exposure to covid-19: Secondary | ICD-10-CM | POA: Insufficient documentation

## 2019-12-22 LAB — SARS CORONAVIRUS 2 (TAT 6-24 HRS): SARS Coronavirus 2: NEGATIVE

## 2019-12-25 NOTE — Anesthesia Preprocedure Evaluation (Addendum)
Anesthesia Evaluation  Patient identified by MRN, date of birth, ID band Patient awake    Reviewed: Allergy & Precautions, NPO status , Patient's Chart, lab work & pertinent test results  History of Anesthesia Complications Negative for: history of anesthetic complications  Airway Mallampati: II  TM Distance: >3 FB Neck ROM: Full    Dental  (+) Teeth Intact   Pulmonary neg pulmonary ROS, former smoker,    Pulmonary exam normal        Cardiovascular Normal cardiovascular exam+ dysrhythmias Supra Ventricular Tachycardia      Neuro/Psych Depression    GI/Hepatic Neg liver ROS, GERD  ,  Endo/Other  Hypothyroidism   Renal/GU negative Renal ROS  negative genitourinary   Musculoskeletal  (+) Arthritis , Osteoarthritis,    Abdominal   Peds  Hematology negative hematology ROS (+)   Anesthesia Other Findings  WPW syndrome, GERD, hypothyroid, s/p ACDF Plts 326, no anticoagulants  Stress test 12/05/19:  The left ventricular ejection fraction is normal (55-65%).  Nuclear stress EF: 64%.  There was no ST segment deviation noted during stress.  No T wave inversion was noted during stress.  The study is normal.  This is a low risk study.  EKG 11/21/19: NSR  Cleared by Cardiology     Reproductive/Obstetrics                           Anesthesia Physical Anesthesia Plan  ASA: II  Anesthesia Plan: Spinal   Post-op Pain Management:  Regional for Post-op pain   Induction:   PONV Risk Score and Plan: 2 and Propofol infusion, Treatment may vary due to age or medical condition, Ondansetron and TIVA  Airway Management Planned: Nasal Cannula and Simple Face Mask  Additional Equipment: None  Intra-op Plan:   Post-operative Plan:   Informed Consent: I have reviewed the patients History and Physical, chart, labs and discussed the procedure including the risks, benefits and alternatives for  the proposed anesthesia with the patient or authorized representative who has indicated his/her understanding and acceptance.     Dental advisory given  Plan Discussed with:   Anesthesia Plan Comments:        Anesthesia Quick Evaluation

## 2019-12-26 ENCOUNTER — Ambulatory Visit (HOSPITAL_COMMUNITY): Payer: Medicare HMO | Admitting: Physician Assistant

## 2019-12-26 ENCOUNTER — Encounter (HOSPITAL_COMMUNITY)
Admission: RE | Disposition: A | Discharge status: Home / Self Care | Payer: Self-pay | Source: Home / Self Care | Attending: Orthopedic Surgery

## 2019-12-26 ENCOUNTER — Encounter (HOSPITAL_COMMUNITY): Payer: Self-pay | Admitting: Orthopedic Surgery

## 2019-12-26 ENCOUNTER — Ambulatory Visit (HOSPITAL_COMMUNITY): Payer: Medicare HMO | Admitting: Anesthesiology

## 2019-12-26 ENCOUNTER — Other Ambulatory Visit: Payer: Self-pay

## 2019-12-26 ENCOUNTER — Observation Stay (HOSPITAL_COMMUNITY)
Admission: RE | Admit: 2019-12-26 | Discharge: 2019-12-27 | Disposition: A | Discharge status: Home / Self Care | Payer: Medicare HMO | Attending: Orthopedic Surgery | Admitting: Orthopedic Surgery

## 2019-12-26 DIAGNOSIS — Z96652 Presence of left artificial knee joint: Secondary | ICD-10-CM

## 2019-12-26 DIAGNOSIS — Z87891 Personal history of nicotine dependence: Secondary | ICD-10-CM | POA: Diagnosis not present

## 2019-12-26 DIAGNOSIS — E039 Hypothyroidism, unspecified: Secondary | ICD-10-CM | POA: Insufficient documentation

## 2019-12-26 DIAGNOSIS — M1712 Unilateral primary osteoarthritis, left knee: Principal | ICD-10-CM | POA: Insufficient documentation

## 2019-12-26 DIAGNOSIS — K219 Gastro-esophageal reflux disease without esophagitis: Secondary | ICD-10-CM | POA: Diagnosis not present

## 2019-12-26 DIAGNOSIS — I493 Ventricular premature depolarization: Secondary | ICD-10-CM | POA: Diagnosis not present

## 2019-12-26 DIAGNOSIS — E785 Hyperlipidemia, unspecified: Secondary | ICD-10-CM | POA: Diagnosis not present

## 2019-12-26 DIAGNOSIS — G8918 Other acute postprocedural pain: Secondary | ICD-10-CM | POA: Diagnosis not present

## 2019-12-26 DIAGNOSIS — Z96659 Presence of unspecified artificial knee joint: Secondary | ICD-10-CM | POA: Insufficient documentation

## 2019-12-26 HISTORY — DX: Presence of left artificial knee joint: Z96.652

## 2019-12-26 HISTORY — PX: TOTAL KNEE ARTHROPLASTY: SHX125

## 2019-12-26 HISTORY — DX: Presence of unspecified artificial knee joint: Z96.659

## 2019-12-26 LAB — TYPE AND SCREEN
ABO/RH(D): A POS
Antibody Screen: NEGATIVE

## 2019-12-26 LAB — ABO/RH: ABO/RH(D): A POS

## 2019-12-26 SURGERY — ARTHROPLASTY, KNEE, TOTAL
Anesthesia: Spinal | Site: Knee | Laterality: Left

## 2019-12-26 MED ORDER — GLYCOPYRROLATE 0.2 MG/ML IJ SOLN
INTRAMUSCULAR | Status: DC | PRN
  Administered 2019-12-26: .1 mg via INTRAVENOUS

## 2019-12-26 MED ORDER — CEFAZOLIN SODIUM-DEXTROSE 2-4 GM/100ML-% IV SOLN
2.0000 g | Freq: Four times a day (QID) | INTRAVENOUS | Status: AC
  Administered 2019-12-26 (×2): 2 g via INTRAVENOUS
  Filled 2019-12-26 (×2): qty 100

## 2019-12-26 MED ORDER — SERTRALINE HCL 100 MG PO TABS
200.0000 mg | ORAL_TABLET | Freq: Every day | ORAL | Status: DC
  Administered 2019-12-26: 200 mg via ORAL
  Filled 2019-12-26: qty 4
  Filled 2019-12-26: qty 2

## 2019-12-26 MED ORDER — PROPOFOL 1000 MG/100ML IV EMUL
INTRAVENOUS | Status: AC
  Filled 2019-12-26: qty 100

## 2019-12-26 MED ORDER — DEXAMETHASONE SODIUM PHOSPHATE 10 MG/ML IJ SOLN
10.0000 mg | Freq: Once | INTRAMUSCULAR | Status: AC
  Administered 2019-12-26: 10 mg via INTRAVENOUS

## 2019-12-26 MED ORDER — ONDANSETRON HCL 4 MG/2ML IJ SOLN
INTRAMUSCULAR | Status: AC
  Filled 2019-12-26: qty 2

## 2019-12-26 MED ORDER — MIDAZOLAM HCL 5 MG/5ML IJ SOLN
INTRAMUSCULAR | Status: DC | PRN
  Administered 2019-12-26 (×2): 1 mg via INTRAVENOUS

## 2019-12-26 MED ORDER — STERILE WATER FOR IRRIGATION IR SOLN
Status: DC | PRN
  Administered 2019-12-26: 1000 mL

## 2019-12-26 MED ORDER — DEXAMETHASONE SODIUM PHOSPHATE 10 MG/ML IJ SOLN
10.0000 mg | Freq: Once | INTRAMUSCULAR | Status: AC
  Administered 2019-12-27: 10 mg via INTRAVENOUS
  Filled 2019-12-26: qty 1

## 2019-12-26 MED ORDER — FERROUS SULFATE 325 (65 FE) MG PO TABS
325.0000 mg | ORAL_TABLET | Freq: Two times a day (BID) | ORAL | Status: DC
  Administered 2019-12-26 – 2019-12-27 (×2): 325 mg via ORAL
  Filled 2019-12-26 (×2): qty 1

## 2019-12-26 MED ORDER — BUPIVACAINE IN DEXTROSE 0.75-8.25 % IT SOLN
INTRATHECAL | Status: DC | PRN
  Administered 2019-12-26: 1.6 mL via INTRATHECAL

## 2019-12-26 MED ORDER — METOCLOPRAMIDE HCL 5 MG/ML IJ SOLN: 5.0000 mg | Freq: Three times a day (TID) | INTRAMUSCULAR | Status: DC | PRN

## 2019-12-26 MED ORDER — CEFAZOLIN SODIUM-DEXTROSE 2-4 GM/100ML-% IV SOLN
2.0000 g | INTRAVENOUS | Status: AC
  Administered 2019-12-26: 2 g via INTRAVENOUS
  Filled 2019-12-26: qty 100

## 2019-12-26 MED ORDER — SODIUM CHLORIDE 0.9 % IV SOLN: INTRAVENOUS | Status: DC

## 2019-12-26 MED ORDER — PHENOL 1.4 % MT LIQD: 1.0000 | OROMUCOSAL | Status: DC | PRN

## 2019-12-26 MED ORDER — DOCUSATE SODIUM 100 MG PO CAPS
100.0000 mg | ORAL_CAPSULE | Freq: Two times a day (BID) | ORAL | Status: DC
  Administered 2019-12-26 – 2019-12-27 (×3): 100 mg via ORAL
  Filled 2019-12-26 (×2): qty 1

## 2019-12-26 MED ORDER — SODIUM CHLORIDE 0.9 % IR SOLN
Status: DC | PRN
  Administered 2019-12-26: 1000 mL

## 2019-12-26 MED ORDER — LEVOTHYROXINE SODIUM 100 MCG PO TABS
100.0000 ug | ORAL_TABLET | Freq: Every day | ORAL | Status: DC
  Administered 2019-12-27: 100 ug via ORAL
  Filled 2019-12-26: qty 1

## 2019-12-26 MED ORDER — RIVAROXABAN 10 MG PO TABS
10.0000 mg | ORAL_TABLET | ORAL | Status: DC
  Administered 2019-12-27: 10 mg via ORAL
  Filled 2019-12-26: qty 1

## 2019-12-26 MED ORDER — SODIUM CHLORIDE (PF) 0.9 % IJ SOLN
INTRAMUSCULAR | Status: DC | PRN
  Administered 2019-12-26: 30 mL

## 2019-12-26 MED ORDER — 0.9 % SODIUM CHLORIDE (POUR BTL) OPTIME
TOPICAL | Status: DC | PRN
  Administered 2019-12-26: 1000 mL

## 2019-12-26 MED ORDER — KETOROLAC TROMETHAMINE 30 MG/ML IJ SOLN
INTRAMUSCULAR | Status: AC
  Filled 2019-12-26: qty 1

## 2019-12-26 MED ORDER — FENTANYL CITRATE (PF) 100 MCG/2ML IJ SOLN
INTRAMUSCULAR | Status: AC
  Filled 2019-12-26: qty 2

## 2019-12-26 MED ORDER — HYDROCODONE-ACETAMINOPHEN 5-325 MG PO TABS
1.0000 | ORAL_TABLET | ORAL | Status: DC | PRN
  Administered 2019-12-26: 2 via ORAL
  Administered 2019-12-26: 1 via ORAL
  Administered 2019-12-26 – 2019-12-27 (×4): 2 via ORAL
  Filled 2019-12-26 (×2): qty 2
  Filled 2019-12-26: qty 1
  Filled 2019-12-26 (×3): qty 2

## 2019-12-26 MED ORDER — LACTATED RINGERS IV SOLN: INTRAVENOUS | Status: DC

## 2019-12-26 MED ORDER — PANTOPRAZOLE SODIUM 40 MG PO TBEC
40.0000 mg | DELAYED_RELEASE_TABLET | Freq: Every day | ORAL | Status: DC
  Administered 2019-12-27: 40 mg via ORAL
  Filled 2019-12-26: qty 1

## 2019-12-26 MED ORDER — ONDANSETRON HCL 4 MG/2ML IJ SOLN
INTRAMUSCULAR | Status: DC | PRN
  Administered 2019-12-26: 4 mg via INTRAVENOUS

## 2019-12-26 MED ORDER — POLYETHYLENE GLYCOL 3350 17 G PO PACK
17.0000 g | PACK | Freq: Two times a day (BID) | ORAL | Status: DC
  Filled 2019-12-26 (×2): qty 1

## 2019-12-26 MED ORDER — BUPIVACAINE-EPINEPHRINE (PF) 0.25% -1:200000 IJ SOLN
INTRAMUSCULAR | Status: DC | PRN
  Administered 2019-12-26: 30 mL via PERINEURAL

## 2019-12-26 MED ORDER — ALUM & MAG HYDROXIDE-SIMETH 200-200-20 MG/5ML PO SUSP: 15.0000 mL | ORAL | Status: DC | PRN

## 2019-12-26 MED ORDER — MORPHINE SULFATE (PF) 2 MG/ML IV SOLN: 0.5000 mg | INTRAVENOUS | Status: DC | PRN

## 2019-12-26 MED ORDER — DEXAMETHASONE SODIUM PHOSPHATE 10 MG/ML IJ SOLN
INTRAMUSCULAR | Status: AC
  Filled 2019-12-26: qty 1

## 2019-12-26 MED ORDER — ACETAMINOPHEN 325 MG PO TABS: 325.0000 mg | ORAL_TABLET | Freq: Four times a day (QID) | ORAL | Status: DC | PRN

## 2019-12-26 MED ORDER — AMISULPRIDE (ANTIEMETIC) 5 MG/2ML IV SOLN: 10.0000 mg | Freq: Once | INTRAVENOUS | Status: DC | PRN

## 2019-12-26 MED ORDER — FLECAINIDE ACETATE 100 MG PO TABS
100.0000 mg | ORAL_TABLET | Freq: Two times a day (BID) | ORAL | Status: DC
  Administered 2019-12-26 – 2019-12-27 (×2): 100 mg via ORAL
  Filled 2019-12-26 (×2): qty 1

## 2019-12-26 MED ORDER — GLYCOPYRROLATE PF 0.2 MG/ML IJ SOSY
PREFILLED_SYRINGE | INTRAMUSCULAR | Status: AC
  Filled 2019-12-26: qty 1

## 2019-12-26 MED ORDER — OXYCODONE HCL 5 MG PO TABS: 5.0000 mg | ORAL_TABLET | Freq: Once | ORAL | Status: DC | PRN

## 2019-12-26 MED ORDER — ONDANSETRON HCL 4 MG/2ML IJ SOLN: 4.0000 mg | Freq: Four times a day (QID) | INTRAMUSCULAR | Status: DC | PRN

## 2019-12-26 MED ORDER — TRANEXAMIC ACID-NACL 1000-0.7 MG/100ML-% IV SOLN
1000.0000 mg | INTRAVENOUS | Status: AC
  Administered 2019-12-26: 1000 mg via INTRAVENOUS
  Filled 2019-12-26: qty 100

## 2019-12-26 MED ORDER — ORAL CARE MOUTH RINSE: 15.0000 mL | Freq: Once | OROMUCOSAL | Status: AC

## 2019-12-26 MED ORDER — ATORVASTATIN CALCIUM 20 MG PO TABS
20.0000 mg | ORAL_TABLET | Freq: Every day | ORAL | Status: DC
  Filled 2019-12-26: qty 1

## 2019-12-26 MED ORDER — MIDAZOLAM HCL 2 MG/2ML IJ SOLN
INTRAMUSCULAR | Status: AC
  Filled 2019-12-26: qty 2

## 2019-12-26 MED ORDER — SODIUM CHLORIDE 0.9 % IV SOLN
INTRAVENOUS | Status: DC | PRN
  Administered 2019-12-26: 20 ug/min via INTRAVENOUS

## 2019-12-26 MED ORDER — DIPHENHYDRAMINE HCL 12.5 MG/5ML PO ELIX: 12.5000 mg | ORAL_SOLUTION | ORAL | Status: DC | PRN

## 2019-12-26 MED ORDER — HYDROCODONE-ACETAMINOPHEN 7.5-325 MG PO TABS: 1.0000 | ORAL_TABLET | ORAL | Status: DC | PRN

## 2019-12-26 MED ORDER — FENTANYL CITRATE (PF) 100 MCG/2ML IJ SOLN: 25.0000 ug | INTRAMUSCULAR | Status: DC | PRN

## 2019-12-26 MED ORDER — OXYCODONE HCL 5 MG/5ML PO SOLN: 5.0000 mg | Freq: Once | ORAL | Status: DC | PRN

## 2019-12-26 MED ORDER — METHOCARBAMOL 500 MG PO TABS
500.0000 mg | ORAL_TABLET | Freq: Four times a day (QID) | ORAL | Status: DC | PRN
  Administered 2019-12-26 – 2019-12-27 (×2): 500 mg via ORAL
  Filled 2019-12-26 (×2): qty 1

## 2019-12-26 MED ORDER — POVIDONE-IODINE 10 % EX SWAB
2.0000 "application " | Freq: Once | CUTANEOUS | Status: AC
  Administered 2019-12-26: 2 via TOPICAL

## 2019-12-26 MED ORDER — BISACODYL 10 MG RE SUPP: 10.0000 mg | Freq: Every day | RECTAL | Status: DC | PRN

## 2019-12-26 MED ORDER — TRANEXAMIC ACID-NACL 1000-0.7 MG/100ML-% IV SOLN
1000.0000 mg | Freq: Once | INTRAVENOUS | Status: AC
  Administered 2019-12-26: 1000 mg via INTRAVENOUS
  Filled 2019-12-26: qty 100

## 2019-12-26 MED ORDER — FENTANYL CITRATE (PF) 100 MCG/2ML IJ SOLN
INTRAMUSCULAR | Status: DC | PRN
Start: 2019-12-26 — End: 2019-12-26
  Administered 2019-12-26: 50 ug via INTRAVENOUS

## 2019-12-26 MED ORDER — ROPIVACAINE HCL 5 MG/ML IJ SOLN
INTRAMUSCULAR | Status: DC | PRN
  Administered 2019-12-26: 30 mL via PERINEURAL

## 2019-12-26 MED ORDER — ONDANSETRON HCL 4 MG PO TABS: 4.0000 mg | ORAL_TABLET | Freq: Four times a day (QID) | ORAL | Status: DC | PRN

## 2019-12-26 MED ORDER — KETOROLAC TROMETHAMINE 30 MG/ML IJ SOLN
INTRAMUSCULAR | Status: DC | PRN
Start: 2019-12-26 — End: 2019-12-26
  Administered 2019-12-26: 30 mg

## 2019-12-26 MED ORDER — CHLORHEXIDINE GLUCONATE 0.12 % MT SOLN
15.0000 mL | Freq: Once | OROMUCOSAL | Status: AC
  Administered 2019-12-26: 15 mL via OROMUCOSAL

## 2019-12-26 MED ORDER — MAGNESIUM CITRATE PO SOLN: 1.0000 | Freq: Once | ORAL | Status: DC | PRN

## 2019-12-26 MED ORDER — MENTHOL 3 MG MT LOZG: 1.0000 | LOZENGE | OROMUCOSAL | Status: DC | PRN

## 2019-12-26 MED ORDER — METOCLOPRAMIDE HCL 5 MG PO TABS: 5.0000 mg | ORAL_TABLET | Freq: Three times a day (TID) | ORAL | Status: DC | PRN

## 2019-12-26 MED ORDER — SODIUM CHLORIDE (PF) 0.9 % IJ SOLN
INTRAMUSCULAR | Status: AC
  Filled 2019-12-26: qty 50

## 2019-12-26 MED ORDER — PROPOFOL 500 MG/50ML IV EMUL
INTRAVENOUS | Status: DC | PRN
  Administered 2019-12-26: 100 ug/kg/min via INTRAVENOUS

## 2019-12-26 MED ORDER — METHOCARBAMOL 1000 MG/10ML IJ SOLN
500.0000 mg | Freq: Four times a day (QID) | INTRAVENOUS | Status: DC | PRN
  Filled 2019-12-26: qty 5

## 2019-12-26 MED ORDER — BUPIVACAINE-EPINEPHRINE 0.25% -1:200000 IJ SOLN
INTRAMUSCULAR | Status: AC
  Filled 2019-12-26: qty 1

## 2019-12-26 MED ORDER — ROPINIROLE HCL 1 MG PO TABS
4.0000 mg | ORAL_TABLET | Freq: Every day | ORAL | Status: DC
  Administered 2019-12-26: 4 mg via ORAL
  Filled 2019-12-26: qty 4

## 2019-12-26 MED ORDER — PHENYLEPHRINE HCL (PRESSORS) 10 MG/ML IV SOLN
INTRAVENOUS | Status: AC
  Filled 2019-12-26: qty 1

## 2019-12-26 MED ORDER — ONDANSETRON HCL 4 MG/2ML IJ SOLN: 4.0000 mg | Freq: Once | INTRAMUSCULAR | Status: DC | PRN

## 2019-12-26 SURGICAL SUPPLY — 58 items
ATTUNE MED ANAT PAT 35 KNEE (Knees) ×2 IMPLANT
ATTUNE PSFEM LTSZ4 NARCEM KNEE (Femur) ×2 IMPLANT
ATTUNE PSRP INSR SZ4 5 KNEE (Insert) ×2 IMPLANT
BAG ZIPLOCK 12X15 (MISCELLANEOUS) IMPLANT
BASEPLATE TIBIAL ROTATING SZ 4 (Knees) ×2 IMPLANT
BLADE SAW SGTL 11.0X1.19X90.0M (BLADE) IMPLANT
BLADE SAW SGTL 13.0X1.19X90.0M (BLADE) ×2 IMPLANT
BLADE SURG SZ10 CARB STEEL (BLADE) ×4 IMPLANT
BNDG ELASTIC 6X5.8 VLCR STR LF (GAUZE/BANDAGES/DRESSINGS) ×2 IMPLANT
BOWL SMART MIX CTS (DISPOSABLE) ×2 IMPLANT
CEMENT HV SMART SET (Cement) ×4 IMPLANT
COVER SURGICAL LIGHT HANDLE (MISCELLANEOUS) ×2 IMPLANT
COVER WAND RF STERILE (DRAPES) IMPLANT
CUFF TOURN SGL QUICK 34 (TOURNIQUET CUFF) ×1
CUFF TRNQT CYL 34X4.125X (TOURNIQUET CUFF) ×1 IMPLANT
DECANTER SPIKE VIAL GLASS SM (MISCELLANEOUS) ×4 IMPLANT
DERMABOND ADVANCED (GAUZE/BANDAGES/DRESSINGS) ×1
DERMABOND ADVANCED .7 DNX12 (GAUZE/BANDAGES/DRESSINGS) ×1 IMPLANT
DRAPE U-SHAPE 47X51 STRL (DRAPES) ×2 IMPLANT
DRESSING AQUACEL AG SP 3.5X10 (GAUZE/BANDAGES/DRESSINGS) ×1 IMPLANT
DRSG AQUACEL AG SP 3.5X10 (GAUZE/BANDAGES/DRESSINGS) ×2
DURAPREP 26ML APPLICATOR (WOUND CARE) ×4 IMPLANT
ELECT REM PT RETURN 15FT ADLT (MISCELLANEOUS) ×2 IMPLANT
GLOVE BIO SURGEON STRL SZ 6 (GLOVE) ×2 IMPLANT
GLOVE BIOGEL PI IND STRL 6.5 (GLOVE) ×1 IMPLANT
GLOVE BIOGEL PI IND STRL 7.5 (GLOVE) ×1 IMPLANT
GLOVE BIOGEL PI IND STRL 8.5 (GLOVE) ×1 IMPLANT
GLOVE BIOGEL PI INDICATOR 6.5 (GLOVE) ×1
GLOVE BIOGEL PI INDICATOR 7.5 (GLOVE) ×1
GLOVE BIOGEL PI INDICATOR 8.5 (GLOVE) ×1
GLOVE ECLIPSE 8.0 STRL XLNG CF (GLOVE) ×2 IMPLANT
GLOVE ORTHO TXT STRL SZ7.5 (GLOVE) ×2 IMPLANT
GOWN STRL REUS W/ TWL LRG LVL3 (GOWN DISPOSABLE) ×1 IMPLANT
GOWN STRL REUS W/TWL 2XL LVL3 (GOWN DISPOSABLE) ×2 IMPLANT
GOWN STRL REUS W/TWL LRG LVL3 (GOWN DISPOSABLE) ×3 IMPLANT
HANDPIECE INTERPULSE COAX TIP (DISPOSABLE) ×1
HOLDER FOLEY CATH W/STRAP (MISCELLANEOUS) IMPLANT
KIT TURNOVER KIT A (KITS) IMPLANT
MANIFOLD NEPTUNE II (INSTRUMENTS) ×2 IMPLANT
NDL SAFETY ECLIPSE 18X1.5 (NEEDLE) IMPLANT
NEEDLE HYPO 18GX1.5 SHARP (NEEDLE)
NS IRRIG 1000ML POUR BTL (IV SOLUTION) ×2 IMPLANT
PACK TOTAL KNEE CUSTOM (KITS) ×2 IMPLANT
PENCIL SMOKE EVACUATOR (MISCELLANEOUS) IMPLANT
PIN DRILL FIX HALF THREAD (BIT) ×2 IMPLANT
PIN FIX SIGMA LCS THRD HI (PIN) ×2 IMPLANT
PROTECTOR NERVE ULNAR (MISCELLANEOUS) ×2 IMPLANT
SET HNDPC FAN SPRY TIP SCT (DISPOSABLE) ×1 IMPLANT
SET PAD KNEE POSITIONER (MISCELLANEOUS) ×2 IMPLANT
SUT MNCRL AB 4-0 PS2 18 (SUTURE) ×2 IMPLANT
SUT STRATAFIX PDS+ 0 24IN (SUTURE) ×2 IMPLANT
SUT VIC AB 1 CT1 36 (SUTURE) ×2 IMPLANT
SUT VIC AB 2-0 CT1 27 (SUTURE) ×3
SUT VIC AB 2-0 CT1 TAPERPNT 27 (SUTURE) ×3 IMPLANT
SYR 3ML LL SCALE MARK (SYRINGE) ×2 IMPLANT
TRAY FOLEY MTR SLVR 16FR STAT (SET/KITS/TRAYS/PACK) ×2 IMPLANT
WATER STERILE IRR 1000ML POUR (IV SOLUTION) ×4 IMPLANT
WRAP KNEE MAXI GEL POST OP (GAUZE/BANDAGES/DRESSINGS) ×2 IMPLANT

## 2019-12-26 NOTE — Care Plan (Signed)
-  Ortho Bundle Case Management Note   Patient Details  Name: Laura Singleton MRN: [id number] Date of Birth: [birth year]  L TKA on 12-26-19 DCP:  Home with dtr.  1 story home with 0 ste.  Has ramp entrance. DME:  No needs.  Has a RW and 3-in-1. PT:  [facility]. PT eval scheduled on 12-28-19.   DME Arranged:  N/A DME Agency:  NA  HH Arranged:  NA HH Agency:  NA  Additional Comments: Please contact me with any questions of if this plan should need to change.  Marianne Sofia, RN,CCM [facility]  [phone] 12/26/2019, 4:43 PM

## 2019-12-26 NOTE — Interval H&P Note (Signed)
History and Physical Interval Note:  12/26/2019 7:02 AM  Laura Singleton  has presented today for surgery, with the diagnosis of Left knee osteoarthritis.  The various methods of treatment have been discussed with the patient and family. After consideration of risks, benefits and other options for treatment, the patient has consented to  Procedure(s) with comments: TOTAL KNEE ARTHROPLASTY (Left) - 70 mins as a surgical intervention.  The patient's history has been reviewed, patient examined, no change in status, stable for surgery.  I have reviewed the patient's chart and labs.  Questions were answered to the patient's satisfaction.     Mauri Pole

## 2019-12-26 NOTE — Op Note (Signed)
NAME:  Laura Singleton                      MEDICAL RECORD NO.:  [id number]                             FACILITY:  [facility]      PHYSICIAN:  Pietro Cassis. Alvan Dame, M.D.  DATE OF BIRTH:  25-Nov-1945      DATE OF PROCEDURE:  12/26/2019                                     OPERATIVE REPORT         PREOPERATIVE DIAGNOSIS:  Left knee osteoarthritis.      POSTOPERATIVE DIAGNOSIS:  Left knee osteoarthritis.      FINDINGS:  The patient was noted to have complete loss of cartilage and   bone-on-bone arthritis with associated osteophytes in the lateral and patellofemoral compartments of   the knee with a significant synovitis and associated effusion.  The patient had failed months of conservative treatment including medications, injection therapy, activity modification.     PROCEDURE:  Left total knee replacement.      COMPONENTS USED:  DePuy Attune rotating platform posterior stabilized knee   system, a size 4N femur, 4 tibia, size 5 mm PS AOX insert, and 35 anatomic patellar   button.      SURGEON:  Pietro Cassis. Alvan Dame, M.D.      ASSISTANT:  Griffith Citron, PA-C.      ANESTHESIA:  Regional and Spinal.      SPECIMENS:  None.      COMPLICATION:  None.      DRAINS:  None.  EBL: <100cc      TOURNIQUET TIME:   Total Tourniquet Time Documented: Thigh (Left) - 38 minutes Total: Thigh (Left) - 38 minutes  .      The patient was stable to the recovery room.      INDICATION FOR PROCEDURE:  Laura Singleton is a 74 y.o. female patient of   mine.  The patient had been seen, evaluated, and treated for months conservatively in the   office with medication, activity modification, and injections.  The patient had   radiographic changes of bone-on-bone arthritis with endplate sclerosis and osteophytes noted.  Based on the radiographic changes and failed conservative measures, the patient   decided to proceed with definitive treatment, total knee replacement.  Risks of infection, DVT, component failure, need for  revision surgery, neurovascular injury were reviewed in the office setting.  The postop course was reviewed stressing the efforts to maximize post-operative satisfaction and function.  Consent was obtained for benefit of pain   relief.      PROCEDURE IN DETAIL:  The patient was brought to the operative theater.   Once adequate anesthesia, preoperative antibiotics, 2 gm of Ancef,1 gm of Tranexamic Acid, and 10 mg of Decadron administered, the patient was positioned supine with a left thigh tourniquet placed.  The  left lower extremity was prepped and draped in sterile fashion.  A time-   out was performed identifying the patient, planned procedure, and the appropriate extremity.      The left lower extremity was placed in the [facility] leg holder.  The leg was   exsanguinated, tourniquet elevated to 250 mmHg.  A midline incision was   made followed by  median parapatellar arthrotomy.  Following initial   exposure, attention was first directed to the patella.  Precut   measurement was noted to be 22 mm.  I resected down to 13 mm and used a   35 anatomic patellar button to restore patellar height as well as cover the cut surface.      The lug holes were drilled and a metal shim was placed to protect the   patella from retractors and saw blade during the procedure.      At this point, attention was now directed to the femur.  The femoral   canal was opened with a drill, irrigated to try to prevent fat emboli.  An   intramedullary rod was passed at 3 degrees valgus, 11 mm of bone was   resected off the distal femur.  Following this resection, the tibia was   subluxated anteriorly.  Using the extramedullary guide, 3 mm of bone was resected off   the proximal media tibia.  We confirmed the gap would be   stable medially and laterally with a size 5 spacer block as well as confirmed that the tibial cut was perpendicular in the coronal plane, checking with an alignment rod.      Once this was done, I  sized the femur to be a size 4 in the anterior-   posterior dimension, chose a narrow component based on medial and   lateral dimension.  The size 4 rotation block was then pinned in   position anterior referenced using the C-clamp to set rotation.  The   anterior, posterior, and  chamfer cuts were made without difficulty nor   notching making certain that I was along the anterior cortex to help   with flexion gap stability.      The final box cut was made off the lateral aspect of distal femur.  I then used the laminar spreader to exposure the posterior aspect of the femur to remove osteophytes and elevate posterior capsule.     At this point, the tibia was sized to be a size 4.  The size 4 tray was   then pinned in position through the medial third of the tubercle,   drilled, and keel punched.  Trial reduction was now carried with a 4 femur,  4 tibia, a size 5 mm PS insert, and the 35 anatomic patella botton.  The knee was brought to full extension with good flexion stability with the patella   tracking through the trochlea without application of pressure.  Given   all these findings the trial components removed.  Final components were   opened and cement was mixed.  The knee was irrigated with normal saline solution and pulse lavage.  The synovial lining was   then injected with 30 cc of 0.25% Marcaine with epinephrine, 1 cc of Toradol and 30 cc of NS for a total of 61 cc.     Final implants were then cemented onto cleaned and dried cut surfaces of bone with the knee brought to extension with a size 5 mm PS trial insert.      Once the cement had fully cured, excess cement was removed   throughout the knee.  I confirmed that I was satisfied with the range of   motion and stability, and the final size 5 mm PS AOX insert was chosen.  It was   placed into the knee.      The tourniquet had been let down at 38 minutes.  No significant   hemostasis was required.  The extensor mechanism was  then reapproximated using #1 Vicryl and #1 Stratafix sutures with the knee   in flexion.  The   remaining wound was closed with 2-0 Vicryl and running 4-0 Monocryl.   The knee was cleaned, dried, dressed sterilely using Dermabond and   Aquacel dressing.  The patient was then   brought to recovery room in stable condition, tolerating the procedure   well.   Please note that Physician Assistant, Griffith Citron, PA-C was present for the entirety of the case, and was utilized for pre-operative positioning, peri-operative retractor management, general facilitation of the procedure and for primary wound closure at the end of the case.              Pietro Cassis Alvan Dame, M.D.    12/26/2019 8:49 AM

## 2019-12-26 NOTE — Anesthesia Postprocedure Evaluation (Signed)
Anesthesia Post Note  Patient: Laura Singleton  Procedure(s) Performed: TOTAL KNEE ARTHROPLASTY (Left Knee)     Patient location during evaluation: PACU Anesthesia Type: Spinal Level of consciousness: oriented and awake and alert Pain management: pain level controlled Vital Signs Assessment: post-procedure vital signs reviewed and stable Respiratory status: spontaneous breathing, respiratory function stable and nonlabored ventilation Cardiovascular status: blood pressure returned to baseline and stable Postop Assessment: no headache, no backache, no apparent nausea or vomiting and spinal receding Anesthetic complications: no   No complications documented.  Last Vitals:  Vitals:   12/26/19 1208 12/26/19 1340  BP: (!) 141/80 (!) 136/95  Pulse: 62 64  Resp: 20 18  Temp: 36.7 C 36.8 C  SpO2: 100% 98%    Last Pain:  Vitals:   12/26/19 1234  TempSrc:   PainSc: 3                  Lidia Collum

## 2019-12-26 NOTE — Transfer of Care (Addendum)
Immediate Anesthesia Transfer of Care Note  Patient: Consuello Brockel  Procedure(s) Performed: TOTAL KNEE ARTHROPLASTY (Left Knee)  Patient Location: PACU  Anesthesia Type:Regional and Spinal  Level of Consciousness: awake, alert , oriented and patient cooperative  Airway & Oxygen Therapy: Patient Spontanous Breathing and Patient connected to face mask oxygen  Post-op Assessment: Report given to RN  Post vital signs: Reviewed and stable  Last Vitals:  Vitals Value Taken Time  BP 119/65 12/26/19 0915  Temp    Pulse 69 12/26/19 0917  Resp 16 12/26/19 0917  SpO2 100 % 12/26/19 0917  Vitals shown include unvalidated device data.  Last Pain:  Vitals:   12/26/19 0538  TempSrc: Oral  PainSc: 7       Patients Stated Pain Goal: 4 (99/77/41 [id number])  Complications: No complications documented.

## 2019-12-26 NOTE — Evaluation (Signed)
Physical Therapy Evaluation Patient Details Name: Laura Singleton MRN: [id number] DOB: 09-13-1945 Today's Date: 12/26/2019   History of Present Illness  Patient is 74 y.o. female s/p Lt TKA on 12/26/19 with PMH significant for WPW syndrome, HLD, hypothyroidism, cervical myelopathy s/p ACDF C5-7, GERD, depressino, OA.    Clinical Impression  Laura Singleton is a 74 y.o. female POD 0 s/p Lt TKA. Patient reports independence with [facility] for mobility at baseline. Patient is now limited by functional impairments (see PT problem list below) and requires min assist for transfers and gait with RW. Patient was able to ambulate ~65 feet with RW and min assist. Patient instructed in exercise to facilitate ROM and circulation. Patient will benefit from continued skilled PT interventions to address impairments and progress towards PLOF. Acute PT will follow to progress mobility and stair training in preparation for safe discharge home.     Follow Up Recommendations Follow surgeon's recommendation for DC plan and follow-up therapies    Equipment Recommendations  None recommended by PT    Recommendations for Other Services       Precautions / Restrictions Precautions Precautions: Fall Restrictions Weight Bearing Restrictions: No Other Position/Activity Restrictions: WBAT      Mobility  Bed Mobility Overal bed mobility: Needs Assistance Bed Mobility: Supine to Sit     Supine to sit: Min assist;HOB elevated     General bed mobility comments: Cues for use of bed rail, assist to raise trunk and pt taking extra time to scoot forward to EOB.  Transfers Overall transfer level: Needs assistance Equipment used: Rolling walker (2 wheeled) Transfers: Sit to/from Stand Sit to Stand: Min assist         General transfer comment: cues for technique with RW, assist for power up and to steady with rising.   Ambulation/Gait Ambulation/Gait assistance: Min assist Gait Distance (Feet): 65 Feet Assistive device:  Rolling walker (2 wheeled) Gait Pattern/deviations: Step-to pattern;Decreased stride length;Decreased weight shift to left;Decreased stance time - left;Antalgic Gait velocity: decr   General Gait Details: cues for safe step pattern and proximity to RW, no overt LOB noted and no buckling on Lt LE.  Stairs            Wheelchair Mobility    Modified Rankin (Stroke Patients Only)       Balance Overall balance assessment: Needs assistance Sitting-balance support: Feet supported Sitting balance-Leahy Scale: Good     Standing balance support: During functional activity;Bilateral upper extremity supported Standing balance-Leahy Scale: Fair                               Pertinent Vitals/Pain Pain Assessment: Faces Faces Pain Scale: Hurts little more Pain Location: Lt knee Pain Descriptors / Indicators: Aching;Discomfort Pain Intervention(s): Limited activity within patient's tolerance;Repositioned;Premedicated before session;Monitored during session    [name] expects to be discharged to:: Private residence Living Arrangements: Alone Available Help at Discharge: Family Type of Home: House Home Access: Ramped entrance     Home Layout: One level Home Equipment: Environmental consultant - 2 wheels;Cane - single point;Bedside commode Additional Comments: staying at daughters initially, has a ramp at both homes.    Prior Function Level of Independence: Independent with assistive device(s)         Comments: uses SPC for mobility     Hand Dominance   Dominant Hand: Right    Extremity/Trunk Assessment   Upper Extremity Assessment Upper Extremity Assessment: Overall WFL for tasks  assessed    Lower Extremity Assessment Lower Extremity Assessment: LLE deficits/detail LLE Deficits / Details: good quad activation, no extensor lag with SLR LLE Sensation: WNL LLE Coordination: WNL    Cervical / Trunk Assessment Cervical / Trunk Assessment: Normal   Communication   Communication: No difficulties  Cognition Arousal/Alertness: Awake/alert Behavior During Therapy: WFL for tasks assessed/performed Overall Cognitive Status: Within Functional Limits for tasks assessed                                        General Comments      Exercises Total Joint Exercises Ankle Circles/Pumps: AROM;Both;20 reps;Seated Quad Sets: AROM;Left;5 reps;Seated Heel Slides: AROM;Left;5 reps;Seated   Assessment/Plan    PT Assessment Patient needs continued PT services  PT Problem List Decreased strength;Decreased range of motion;Decreased activity tolerance;Decreased mobility;Decreased balance;Decreased knowledge of use of DME;Pain       PT Treatment Interventions DME instruction;Gait training;Stair training;Functional mobility training;Therapeutic activities;Therapeutic exercise;Balance training;Patient/family education    PT Goals (Current goals can be found in the Care Plan section)  Acute Rehab PT Goals Patient Stated Goal: get back to water aerobics PT Goal Formulation: With patient    Frequency 7X/week   Barriers to discharge        Co-evaluation               AM-PAC PT "6 Clicks" Mobility  Outcome Measure Help needed turning from your back to your side while in a flat bed without using bedrails?: A Little Help needed moving from lying on your back to sitting on the side of a flat bed without using bedrails?: A Little Help needed moving to and from a bed to a chair (including a wheelchair)?: A Little Help needed standing up from a chair using your arms (e.g., wheelchair or bedside chair)?: A Little Help needed to walk in hospital room?: A Little Help needed climbing 3-5 steps with a railing? : A Little 6 Click Score: 18    End of Session Equipment Utilized During Treatment: Gait belt Activity Tolerance: Patient tolerated treatment well Patient left: in chair;with call bell/phone within reach;with chair alarm  set;with family/visitor present Nurse Communication: Mobility status PT Visit Diagnosis: Muscle weakness (generalized) (M62.81);Difficulty in walking, not elsewhere classified (R26.2)    Time: 1236-1300 PT Time Calculation (min) (ACUTE ONLY): 24 min   Charges:   PT Evaluation $PT Eval Low Complexity: 1 Low PT Treatments $Gait Training: 8-22 mins        Verner Mould, DPT Acute Rehabilitation Services  Office [phone] Pager [phone]  12/26/2019 1:48 PM

## 2019-12-26 NOTE — Anesthesia Procedure Notes (Signed)
Anesthesia Regional Block: Adductor canal block   Pre-Anesthetic Checklist: ,, timeout performed, Correct Patient, Correct Site, Correct Laterality, Correct Procedure, Correct Position, site marked, Risks and benefits discussed,  Surgical consent,  Pre-op evaluation,  At surgeon's request and post-op pain management  Laterality: Left  Prep: chloraprep       Needles:  Injection technique: Single-shot  Needle Type: Echogenic Stimulator Needle     Needle Length: 10cm  Needle Gauge: 20     Additional Needles:   Procedures:,,,, ultrasound used (permanent image in chart),,,,  Narrative:  Start time: 12/26/2019 7:03 AM End time: 12/26/2019 7:06 AM Injection made incrementally with aspirations every 5 mL.  Performed by: Personally  Anesthesiologist: Lidia Collum, MD  Additional Notes: Standard monitors applied. Skin prepped. Good needle visualization with ultrasound. Injection made in 5cc increments with no resistance to injection. Patient tolerated the procedure well.

## 2019-12-26 NOTE — Plan of Care (Signed)
  Problem: Education: Goal: Knowledge of General Education information will improve Description: Including pain rating scale, medication(s)/side effects and non-pharmacologic comfort measures Outcome: Progressing   Problem: Clinical Measurements: Goal: Cardiovascular complication will be avoided Outcome: Progressing   Problem: Activity: Goal: Risk for activity intolerance will decrease Outcome: Progressing   Problem: Nutrition: Goal: Adequate nutrition will be maintained Outcome: Progressing   Problem: Elimination: Goal: Will not experience complications related to bowel motility Outcome: Progressing   Problem: Pain Managment: Goal: General experience of comfort will improve Outcome: Progressing   Problem: Safety: Goal: Ability to remain free from injury will improve Outcome: Progressing   Problem: Education: Goal: Knowledge of the prescribed therapeutic regimen will improve Outcome: Progressing   Problem: Activity: Goal: Ability to avoid complications of mobility impairment will improve Outcome: Progressing   Problem: Pain Management: Goal: Pain level will decrease with appropriate interventions Outcome: Progressing

## 2019-12-26 NOTE — Plan of Care (Signed)
°  Problem: Clinical Measurements: Goal: Respiratory complications will improve Outcome: Progressing   Problem: Clinical Measurements: Goal: Cardiovascular complication will be avoided Outcome: Progressing   Problem: Coping: Goal: Level of anxiety will decrease Outcome: Progressing   Problem: Elimination: Goal: Will not experience complications related to bowel motility Outcome: Progressing   Problem: Safety: Goal: Ability to remain free from injury will improve Outcome: Progressing   Problem: Education: Goal: Knowledge of the prescribed therapeutic regimen will improve Outcome: Progressing   Problem: Activity: Goal: Ability to avoid complications of mobility impairment will improve Outcome: Progressing

## 2019-12-26 NOTE — Discharge Instructions (Signed)
INSTRUCTIONS AFTER JOINT REPLACEMENT   o Remove items at home which could result in a fall. This includes throw rugs or furniture in walking pathways o ICE to the affected joint every three hours while awake for 30 minutes at a time, for at least the first 3-5 days, and then as needed for pain and swelling.  Continue to use ice for pain and swelling. You may notice swelling that will progress down to the foot and ankle.  This is normal after surgery.  Elevate your leg when you are not up walking on it.   o Continue to use the breathing machine you got in the hospital (incentive spirometer) which will help keep your temperature down.  It is common for your temperature to cycle up and down following surgery, especially at night when you are not up moving around and exerting yourself.  The breathing machine keeps your lungs expanded and your temperature down.   DIET:  As you were doing prior to hospitalization, we recommend a well-balanced diet.  DRESSING / WOUND CARE / SHOWERING  Keep the surgical dressing until follow up.  The dressing is water proof, so you can shower without any extra covering.  IF THE DRESSING FALLS OFF or the wound gets wet inside, change the dressing with sterile gauze.  Please use good hand washing techniques before changing the dressing.  Do not use any lotions or creams on the incision until instructed by your surgeon.    ACTIVITY  o Increase activity slowly as tolerated, but follow the weight bearing instructions below.   o No driving for 6 weeks or until further direction given by your physician.  You cannot drive while taking narcotics.  o No lifting or carrying greater than 10 lbs. until further directed by your surgeon. o Avoid periods of inactivity such as sitting longer than an hour when not asleep. This helps prevent blood clots.  o You may return to work once you are authorized by your doctor.     WEIGHT BEARING   Weight bearing as tolerated with assist  device (walker, cane, etc) as directed, use it as long as suggested by your surgeon or therapist, typically at least 4-6 weeks.   EXERCISES  Results after joint replacement surgery are often greatly improved when you follow the exercise, range of motion and muscle strengthening exercises prescribed by your doctor. Safety measures are also important to protect the joint from further injury. Any time any of these exercises cause you to have increased pain or swelling, decrease what you are doing until you are comfortable again and then slowly increase them. If you have problems or questions, call your caregiver or physical therapist for advice.   Rehabilitation is important following a joint replacement. After just a few days of immobilization, the muscles of the leg can become weakened and shrink (atrophy).  These exercises are designed to build up the tone and strength of the thigh and leg muscles and to improve motion. Often times heat used for twenty to thirty minutes before working out will loosen up your tissues and help with improving the range of motion but do not use heat for the first two weeks following surgery (sometimes heat can increase post-operative swelling).   These exercises can be done on a training (exercise) mat, on the floor, on a table or on a bed. Use whatever works the best and is most comfortable for you.    Use music or television while you are exercising so that   the exercises are a pleasant break in your day. This will make your life better with the exercises acting as a break in your routine that you can look forward to.   Perform all exercises about fifteen times, three times per day or as directed.  You should exercise both the operative leg and the other leg as well.  Exercises include:   . Quad Sets - Tighten up the muscle on the front of the thigh (Quad) and hold for 5-10 seconds.   . Straight Leg Raises - With your knee straight (if you were given a brace, keep it on),  lift the leg to 60 degrees, hold for 3 seconds, and slowly lower the leg.  Perform this exercise against resistance later as your leg gets stronger.  . Leg Slides: Lying on your back, slowly slide your foot toward your buttocks, bending your knee up off the floor (only go as far as is comfortable). Then slowly slide your foot back down until your leg is flat on the floor again.  . Angel Wings: Lying on your back spread your legs to the side as far apart as you can without causing discomfort.  . Hamstring Strength:  Lying on your back, push your heel against the floor with your leg straight by tightening up the muscles of your buttocks.  Repeat, but this time bend your knee to a comfortable angle, and push your heel against the floor.  You may put a pillow under the heel to make it more comfortable if necessary.   A rehabilitation program following joint replacement surgery can speed recovery and prevent re-injury in the future due to weakened muscles. Contact your doctor or a physical therapist for more information on knee rehabilitation.    CONSTIPATION  Constipation is defined medically as fewer than three stools per week and severe constipation as less than one stool per week.  Even if you have a regular bowel pattern at home, your normal regimen is likely to be disrupted due to multiple reasons following surgery.  Combination of anesthesia, postoperative narcotics, change in appetite and fluid intake all can affect your bowels.   YOU MUST use at least one of the following options; they are listed in order of increasing strength to get the job done.  They are all available over the counter, and you may need to use some, POSSIBLY even all of these options:    Drink plenty of fluids (prune juice may be helpful) and high fiber foods Colace 100 mg by mouth twice a day  Senokot for constipation as directed and as needed Dulcolax (bisacodyl), take with full glass of water  Miralax (polyethylene glycol)  once or twice a day as needed.  If you have tried all these things and are unable to have a bowel movement in the first 3-4 days after surgery call either your surgeon or your primary doctor.    If you experience loose stools or diarrhea, hold the medications until you stool forms back up.  If your symptoms do not get better within 1 week or if they get worse, check with your doctor.  If you experience "the worst abdominal pain ever" or develop nausea or vomiting, please contact the office immediately for further recommendations for treatment.   ITCHING:  If you experience itching with your medications, try taking only a single pain pill, or even half a pain pill at a time.  You can also use Benadryl over the counter for itching or also to   help with sleep.   TED HOSE STOCKINGS:  Use stockings on both legs until for at least 2 weeks or as directed by physician office. They may be removed at night for sleeping.  MEDICATIONS:  See your medication summary on the "After Visit Summary" that nursing will review with you.  You may have some home medications which will be placed on hold until you complete the course of blood thinner medication.  It is important for you to complete the blood thinner medication as prescribed.  PRECAUTIONS:  If you experience chest pain or shortness of breath - call 911 immediately for transfer to the hospital emergency department.   If you develop a fever greater that 101 F, purulent drainage from wound, increased redness or drainage from wound, foul odor from the wound/dressing, or calf pain - CONTACT YOUR SURGEON.                                                   FOLLOW-UP APPOINTMENTS:  If you do not already have a post-op appointment, please call the office for an appointment to be seen by your surgeon.  Guidelines for how soon to be seen are listed in your "After Visit Summary", but are typically between 1-4 weeks after surgery.  OTHER INSTRUCTIONS:   Knee  Replacement:  Do not place pillow under knee, focus on keeping the knee straight while resting. CPM instructions: 0-90 degrees, 2 hours in the morning, 2 hours in the afternoon, and 2 hours in the evening. Place foam block, curve side up under heel at all times except when in CPM or when walking.  DO NOT modify, tear, cut, or change the foam block in any way.   DENTAL ANTIBIOTICS:  In most cases prophylactic antibiotics for Dental procdeures after total joint surgery are not necessary.  Exceptions are as follows:  1. History of prior total joint infection  2. Severely immunocompromised (Organ Transplant, cancer chemotherapy, Rheumatoid biologic meds such as [place])  3. Poorly controlled diabetes (A1C &gt; 8.0, blood glucose over 200)  If you have one of these conditions, contact your surgeon for an antibiotic prescription, prior to your dental procedure.   MAKE SURE YOU:  . Understand these instructions.  . Get help right away if you are not doing well or get worse.    Thank you for letting us be a part of your medical care team.  It is a privilege we respect greatly.  We hope these instructions will help you stay on track for a fast and full recovery!   Information on my medicine - XARELTO (Rivaroxaban)  This medication education was reviewed with me or my healthcare representative as part of my discharge preparation.  The pharmacist that spoke with me during my hospital stay was:   Why was Xarelto prescribed for you? Xarelto was prescribed for you to reduce the risk of blood clots forming after orthopedic surgery. The medical term for these abnormal blood clots is venous thromboembolism (VTE).  What do you need to know about xarelto ? Take your Xarelto ONCE DAILY at the same time every day. You may take it either with or without food.  If you have difficulty swallowing the tablet whole, you may crush it and mix in applesauce just prior to taking your dose.  Take  Xarelto exactly as prescribed by your doctor  and DO NOT stop taking Xarelto without talking to the doctor who prescribed the medication.  Stopping without other VTE prevention medication to take the place of Xarelto may increase your risk of developing a clot.  After discharge, you should have regular check-up appointments with your healthcare provider that is prescribing your Xarelto.    What do you do if you miss a dose? If you miss a dose, take it as soon as you remember on the same day then continue your regularly scheduled once daily regimen the next day. Do not take two doses of Xarelto on the same day.   Important Safety Information A possible side effect of Xarelto is bleeding. You should call your healthcare provider right away if you experience any of the following: ? Bleeding from an injury or your nose that does not stop. ? Unusual colored urine (red or dark brown) or unusual colored stools (red or black). ? Unusual bruising for unknown reasons. ? A serious fall or if you hit your head (even if there is no bleeding).  Some medicines may interact with Xarelto and might increase your risk of bleeding while on Xarelto. To help avoid this, consult your healthcare provider or pharmacist prior to using any new prescription or non-prescription medications, including herbals, vitamins, non-steroidal anti-inflammatory drugs (NSAIDs) and supplements.  This website has more information on Xarelto: [email or web address]

## 2019-12-26 NOTE — Anesthesia Procedure Notes (Signed)
Spinal  Patient location during procedure: OR Staffing Performed: anesthesiologist  Anesthesiologist: Kore Madlock E, MD Preanesthetic Checklist Completed: patient identified, IV checked, risks and benefits discussed, surgical consent, monitors and equipment checked, pre-op evaluation and timeout performed Spinal Block Patient position: sitting Prep: DuraPrep and site prepped and draped Patient monitoring: continuous pulse ox, blood pressure and heart rate Approach: midline Location: L3-4 Injection technique: single-shot Needle Needle type: Pencan  Needle gauge: 24 G Needle length: 9 cm Additional Notes Functioning IV was confirmed and monitors were applied. Sterile prep and drape, including hand hygiene and sterile gloves were used. The patient was positioned and the spine was prepped. The skin was anesthetized with lidocaine.  Free flow of clear CSF was obtained prior to injecting local anesthetic into the CSF. The needle was carefully withdrawn. The patient tolerated the procedure well.      

## 2019-12-27 ENCOUNTER — Encounter (HOSPITAL_COMMUNITY): Payer: Self-pay | Admitting: Orthopedic Surgery

## 2019-12-27 DIAGNOSIS — E039 Hypothyroidism, unspecified: Secondary | ICD-10-CM | POA: Diagnosis not present

## 2019-12-27 DIAGNOSIS — M1712 Unilateral primary osteoarthritis, left knee: Secondary | ICD-10-CM | POA: Diagnosis not present

## 2019-12-27 DIAGNOSIS — Z87891 Personal history of nicotine dependence: Secondary | ICD-10-CM | POA: Diagnosis not present

## 2019-12-27 LAB — BASIC METABOLIC PANEL
Anion gap: 9 (ref 5–15)
BUN: 20 mg/dL (ref 8–23)
CO2: 29 mmol/L (ref 22–32)
Calcium: 8.8 mg/dL — ABNORMAL LOW (ref 8.9–10.3)
Chloride: 102 mmol/L (ref 98–111)
Creatinine, Ser: 0.97 mg/dL (ref 0.44–1.00)
GFR, Estimated: 58 mL/min — ABNORMAL LOW (ref >60)
Glucose, Bld: 110 mg/dL — ABNORMAL HIGH (ref 70–99)
Potassium: 3.6 mmol/L (ref 3.5–5.1)
Sodium: 140 mmol/L (ref 135–145)

## 2019-12-27 LAB — CBC
HCT: 29.3 % — ABNORMAL LOW (ref 36.0–46.0)
Hemoglobin: 9.6 g/dL — ABNORMAL LOW (ref 12.0–15.0)
MCH: 30.5 pg (ref 26.0–34.0)
MCHC: 32.8 g/dL (ref 30.0–36.0)
MCV: 93 fL (ref 80.0–100.0)
Platelets: 216 10*3/uL (ref 150–400)
RBC: 3.15 MIL/uL — ABNORMAL LOW (ref 3.87–5.11)
RDW: 13.4 % (ref 11.5–15.5)
WBC: 8.7 10*3/uL (ref 4.0–10.5)
nRBC: 0 % (ref 0.0–0.2)

## 2019-12-27 MED ORDER — DOCUSATE SODIUM 100 MG PO CAPS: 100.0000 mg | ORAL_CAPSULE | Freq: Two times a day (BID) | ORAL | 0 refills | Status: DC

## 2019-12-27 MED ORDER — FERROUS SULFATE 325 (65 FE) MG PO TABS: 325.0000 mg | ORAL_TABLET | Freq: Three times a day (TID) | ORAL | 0 refills | Status: DC

## 2019-12-27 MED ORDER — METHOCARBAMOL 500 MG PO TABS: 500.0000 mg | ORAL_TABLET | Freq: Four times a day (QID) | ORAL | 0 refills | Status: DC | PRN

## 2019-12-27 MED ORDER — RIVAROXABAN 10 MG PO TABS: 10.0000 mg | ORAL_TABLET | Freq: Every day | ORAL | 0 refills | Status: DC

## 2019-12-27 MED ORDER — POLYETHYLENE GLYCOL 3350 17 G PO PACK: 17.0000 g | PACK | Freq: Two times a day (BID) | ORAL | 0 refills | Status: DC

## 2019-12-27 MED ORDER — HYDROCODONE-ACETAMINOPHEN 5-325 MG PO TABS
1.0000 | ORAL_TABLET | ORAL | 0 refills | Status: DC | PRN
Start: 2019-12-27 — End: 2020-05-29

## 2019-12-27 NOTE — Discharge Summary (Signed)
Patient ID: Laura Singleton MRN: [id number] DOB/AGE: [birth year] 74 y.o.  Admit date: 12/26/2019 Discharge date: 12/27/2019  Admission Diagnoses:  Active Problems:   S/P total knee arthroplasty, left   Discharge Diagnoses:  Same  Past Medical History:  Diagnosis Date   Arthritis    Chronic pain of left knee 11/17/2018   Depression    Dysrhythmia    GERD (gastroesophageal reflux disease)    Hyperlipidemia 11/05/2016   Hypothyroidism    Myelopathy of cervical spinal cord with cervical radiculopathy (HCC) 11/05/2016   PVC (premature ventricular contraction) 11/05/2016   PVC's (premature ventricular contractions)    Skin cancer of face    Wolff-Parkinson-White (WPW) syndrome    WPW (Wolff-Parkinson-White syndrome) 11/05/2016    Surgeries: Procedure(s): LEFT TOTAL KNEE ARTHROPLASTY on 12/26/2019   Consultants: N/A  Discharged Condition: Improved  Hospital Course: Laura Singleton is an 74 y.o. female who was admitted 12/26/2019 for operative treatment of<principal problem not specified>. Patient has severe unremitting pain that affects sleep, daily activities, and work/hobbies. After pre-op clearance the patient was taken to the operating room on 12/26/2019 and underwent  Procedure(s): LEFT TOTAL KNEE ARTHROPLASTY.    Patient was given perioperative antibiotics:  Anti-infectives (From admission, onward)   Start     Dose/Rate Route Frequency Ordered Stop   12/26/19 1300  ceFAZolin (ANCEF) IVPB 2g/100 mL premix        2 g 200 mL/hr over 30 Minutes Intravenous Every 6 hours 12/26/19 1012 12/26/19 1923   12/26/19 0600  ceFAZolin (ANCEF) IVPB 2g/100 mL premix        2 g 200 mL/hr over 30 Minutes Intravenous On call to O.R. 12/26/19 5573 12/26/19 2202       Patient was given sequential compression devices, early ambulation, and chemoprophylaxis to prevent DVT.  Patient benefited maximally from hospital stay and there were no complications.    Recent vital signs:  Patient  Vitals for the past 24 hrs:  BP Temp Temp src Pulse Resp SpO2 Height Weight  12/27/19 0543 (!) 90/45 98.1 F (36.7 C) Oral (!) 56 16 98 % -- --  12/27/19 0209 101/64 98.4 F (36.9 C) Oral (!) 55 15 98 % -- --  12/26/19 2200 (!) 99/57 97.7 F (36.5 C) Oral (!) 55 17 98 % -- --  12/26/19 1744 (!) 120/59 98.1 F (36.7 C) -- 61 18 99 % -- --  12/26/19 1340 (!) 136/95 98.2 F (36.8 C) -- 64 18 98 % -- --  12/26/19 1208 (!) 141/80 98.1 F (36.7 C) -- 62 20 100 % -- --  12/26/19 1057 125/73 98 F (36.7 C) -- (!) 56 18 100 % -- --  12/26/19 1006 112/69 (!) 97.4 F (36.3 C) Oral 62 20 100 % 5\' 5"  (1.651 m) 74.1 kg  12/26/19 0945 113/60 98.1 F (36.7 C) -- 63 17 100 % -- --  12/26/19 0930 123/67 -- -- 63 20 100 % -- --     Recent laboratory studies:  Recent Labs    12/27/19 0327  WBC 8.7  HGB 9.6*  HCT 29.3*  PLT 216  NA 140  K 3.6  CL 102  CO2 29  BUN 20  CREATININE 0.97  GLUCOSE 110*  CALCIUM 8.8*     Discharge Medications:   Allergies as of 12/27/2019      Reactions   Bee Venom Itching, Swelling, Other (See Comments)   Passed out   Parafon Forte Dsc [chlorzoxazone] Swelling, Other (See Comments)   Lips  swelling and lips felt sunburned.   Sulfa Antibiotics Hives, Itching      Medication List    STOP taking these medications   fenoprofen 600 MG Tabs tablet Commonly known as: NALFON   ibuprofen 200 MG tablet Commonly known as: ADVIL     TAKE these medications   atorvastatin 20 MG tablet Commonly known as: LIPITOR Take 20 mg by mouth at bedtime.   CALCIUM 600/VITAMIN D PO Take 1 tablet by mouth daily.   docusate sodium 100 MG capsule Commonly known as: Colace Take 1 capsule (100 mg total) by mouth 2 (two) times daily.   ferrous sulfate 325 (65 FE) MG tablet Commonly known as: FerrouSul Take 1 tablet (325 mg total) by mouth 3 (three) times daily with meals for 14 days.   flecainide 100 MG tablet Commonly known as: TAMBOCOR Take 100 mg by mouth 2  (two) times daily.   HYDROcodone-acetaminophen 5-325 MG tablet Commonly known as: Norco Take 1-2 tablets by mouth every 4 (four) hours as needed for moderate pain or severe pain.   levothyroxine 100 MCG tablet Commonly known as: SYNTHROID Take 100 mcg by mouth daily before breakfast.   methocarbamol 500 MG tablet Commonly known as: Robaxin Take 1 tablet (500 mg total) by mouth every 6 (six) hours as needed for muscle spasms.   multivitamin with minerals Tabs tablet Take 1 tablet by mouth daily. Centrum Silver   omeprazole 40 MG capsule Commonly known as: PRILOSEC Take 40 mg by mouth daily.   polyethylene glycol 17 g packet Commonly known as: MIRALAX / GLYCOLAX Take 17 g by mouth 2 (two) times daily.   rivaroxaban 10 MG Tabs tablet Commonly known as: Xarelto Take 1 tablet (10 mg total) by mouth daily.   rOPINIRole 1 MG tablet Commonly known as: REQUIP Take 4 mg by mouth at bedtime.   sertraline 100 MG tablet Commonly known as: ZOLOFT Take 200 mg by mouth daily.            Discharge Care Instructions  (From admission, onward)         Start     Ordered   12/27/19 0000  Change dressing       Comments: Maintain surgical dressing until follow up in the clinic. If the edges start to pull up, may reinforce with tape. If the dressing is no longer working, may remove and cover with gauze and tape, but must keep the area dry and clean.  Call with any questions or concerns.   12/27/19 0919          Diagnostic Studies: MYOCARDIAL PERFUSION IMAGING  Result Date: 12/05/2019  The left ventricular ejection fraction is normal (55-65%).  Nuclear stress EF: 64%.  There was no ST segment deviation noted during stress.  No T wave inversion was noted during stress.  The study is normal.  This is a low risk study.     Disposition: Home  Discharge Instructions    Call MD / Call 911   Complete by: As directed    If you experience chest pain or shortness of breath, CALL  911 and be transported to the hospital emergency room.  If you develope a fever above 101 F, pus (white drainage) or increased drainage or redness at the wound, or calf pain, call your surgeon's office.   Change dressing   Complete by: As directed    Maintain surgical dressing until follow up in the clinic. If the edges start to pull up, may reinforce  with tape. If the dressing is no longer working, may remove and cover with gauze and tape, but must keep the area dry and clean.  Call with any questions or concerns.   Constipation Prevention   Complete by: As directed    Drink plenty of fluids.  Prune juice may be helpful.  You may use a stool softener, such as Colace (over the counter) 100 mg twice a day.  Use MiraLax (over the counter) for constipation as needed.   Diet - low sodium heart healthy   Complete by: As directed    Discharge instructions   Complete by: As directed    Maintain surgical dressing until follow up in the clinic. If the edges start to pull up, may reinforce with tape. If the dressing is no longer working, may remove and cover with gauze and tape, but must keep the area dry and clean.  Follow up in 2 weeks at [facility]. Call with any questions or concerns.   Increase activity slowly as tolerated   Complete by: As directed    Weight bearing as tolerated with assist device (walker, cane, etc) as directed, use it as long as suggested by your surgeon or therapist, typically at least 4-6 weeks.   TED hose   Complete by: As directed    Use stockings (TED hose) for 2 weeks on both leg(s).  You may remove them at night for sleeping.       Follow-up Information    [facility]. Go on 01/10/2020.   Specialty: Orthopedic Surgery Why: You are scheduled for a post-operative appointment on 01-10-20 at 10:00 am.  Contact information: [address] [place] [address] [phone]        [facility]. Go on 12/28/2019.   Why: You  are scheduled for a physical therapy appointment on 12-28-19 at 9:00 am with an arrival time of 8:45 am.                Signed: Lucille Passy [facility] 12/27/2019, 9:20 AM

## 2019-12-27 NOTE — Plan of Care (Signed)
  Problem: Education: Goal: Knowledge of General Education information will improve Description: Including pain rating scale, medication(s)/side effects and non-pharmacologic comfort measures Outcome: Adequate for Discharge   Problem: Health Behavior/Discharge Planning: Goal: Ability to manage health-related needs will improve Outcome: Adequate for Discharge   Problem: Clinical Measurements: Goal: Ability to maintain clinical measurements within normal limits will improve Outcome: Adequate for Discharge Goal: Will remain free from infection Outcome: Adequate for Discharge Goal: Diagnostic test results will improve Outcome: Adequate for Discharge Goal: Respiratory complications will improve Outcome: Adequate for Discharge Goal: Cardiovascular complication will be avoided Outcome: Adequate for Discharge   Problem: Activity: Goal: Risk for activity intolerance will decrease Outcome: Adequate for Discharge   Problem: Nutrition: Goal: Adequate nutrition will be maintained Outcome: Adequate for Discharge   Problem: Coping: Goal: Level of anxiety will decrease Outcome: Adequate for Discharge   Problem: Elimination: Goal: Will not experience complications related to bowel motility Outcome: Adequate for Discharge Goal: Will not experience complications related to urinary retention Outcome: Adequate for Discharge   Problem: Pain Managment: Goal: General experience of comfort will improve Outcome: Adequate for Discharge   Problem: Safety: Goal: Ability to remain free from injury will improve Outcome: Adequate for Discharge   Problem: Education: Goal: Knowledge of the prescribed therapeutic regimen will improve Outcome: Adequate for Discharge Goal: Individualized Educational Video(s) Outcome: Adequate for Discharge   Problem: Activity: Goal: Ability to avoid complications of mobility impairment will improve Outcome: Adequate for Discharge Goal: Range of joint motion will  improve Outcome: Adequate for Discharge   Problem: Clinical Measurements: Goal: Postoperative complications will be avoided or minimized Outcome: Adequate for Discharge   Problem: Pain Management: Goal: Pain level will decrease with appropriate interventions Outcome: Adequate for Discharge   Problem: Acute Rehab PT Goals(only PT should resolve) Goal: Pt Will Go Supine/Side To Sit Outcome: Adequate for Discharge Goal: Patient Will Transfer Sit To/From Stand Outcome: Adequate for Discharge Goal: Pt Will Ambulate Outcome: Adequate for Discharge

## 2019-12-27 NOTE — Progress Notes (Addendum)
° ° ° °  Subjective: 1 Day Post-Op Procedure(s) (LRB): TOTAL KNEE ARTHROPLASTY (Left)   Patient reports pain as mild, pain controlled. No reported events throughout the night.  Discussed the procedure and expectations moving forward.  Ready to be discharged home, if they do well with PT.  Follow up in the clinic in 2 weeks.  Knows to call with any questions or concerns.     Patient's anticipated LOS is less than 2 midnights, meeting these requirements: - Lives within 1 hour of care - Has a competent adult at home to recover with post-op recover - NO history of  - Chronic pain requiring opiods  - Diabetes  - Coronary Artery Disease  - Heart failure  - Heart attack  - Stroke  - DVT/VTE  - Respiratory Failure/COPD  - Renal failure  - Anemia  - Advanced Liver disease       Objective:   VITALS:   Vitals:   12/27/19 0209 12/27/19 0543  BP: 101/64 (!) 90/45  Pulse: (!) 55 (!) 56  Resp: 15 16  Temp: 98.4 F (36.9 C) 98.1 F (36.7 C)  SpO2: 98% 98%    Dorsiflexion/Plantar flexion intact Incision: dressing C/D/I No cellulitis present Compartment soft  LABS Recent Labs    12/27/19 0327  HGB 9.6*  HCT 29.3*  WBC 8.7  PLT 216    Recent Labs    12/27/19 0327  NA 140  K 3.6  BUN 20  CREATININE 0.97  GLUCOSE 110*     Assessment/Plan: 1 Day Post-Op Procedure(s) (LRB): TOTAL KNEE ARTHROPLASTY (Left) Foley cath d/c'ed Advance diet Up with therapy D/C IV fluids Discharge home Follow up in 2 weeks at [facility] Follow up with OLIN,Kynsie Falkner D in 2 weeks.  Contact information:  [facility] [address], [address] [address] [phone]    Overweight (BMI 25-29.9) Estimated body mass index is 27.18 kg/m as calculated from the following:   Height as of this encounter: 5\' 5"  (1.651 m).   Weight as of this encounter: 74.1 kg. Patient also counseled that weight may inhibit the healing process Patient counseled that losing weight  will help with future health issues       Danae Orleans PA-C  [facility] [place]   [place] [address], [address], [place], [place] [address] Phone: [phone] [email or web address]   [employer]

## 2019-12-27 NOTE — Progress Notes (Signed)
Physical Therapy Treatment Patient Details Name: Laura Singleton MRN: [id number] DOB: [birth year] Today's Date: 12/27/2019    History of Present Illness Patient is 74 y.o. female s/p Lt TKA on 12/26/19 with PMH significant for WPW syndrome, HLD, hypothyroidism, cervical myelopathy s/p ACDF C5-7, GERD, depressino, OA.    PT Comments    Pt ambulated 160' with RW, no loss of balance. Pt performed HEP with good technique. Pt is motivated and puts forth good effort. She is ready to DC home from PT standpoint.   Follow Up Recommendations  Follow surgeon's recommendation for DC plan and follow-up therapies     Equipment Recommendations  None recommended by PT    Recommendations for Other Services       Precautions / Restrictions Precautions Precautions: Fall;Knee Precaution Booklet Issued: Yes (comment) Precaution Comments: reviewed no pillow under knee Restrictions Weight Bearing Restrictions: No Other Position/Activity Restrictions: WBAT    Mobility  Bed Mobility Overal bed mobility: Modified Independent Bed Mobility: Supine to Sit     Supine to sit: Modified independent (Device/Increase time)     General bed mobility comments: used rail  Transfers Overall transfer level: Needs assistance Equipment used: Rolling walker (2 wheeled) Transfers: Sit to/from Stand Sit to Stand: Supervision         General transfer comment: VCs hand placement  Ambulation/Gait Ambulation/Gait assistance: Modified independent (Device/Increase time) Gait Distance (Feet): 160 Feet Assistive device: Rolling walker (2 wheeled) Gait Pattern/deviations: Step-to pattern;Decreased stride length;Decreased weight shift to left;Decreased stance time - left;Antalgic Gait velocity: decr   General Gait Details: good sequencing, no loss of balance   Stairs             Wheelchair Mobility    Modified Rankin (Stroke Patients Only)       Balance Overall balance assessment: Needs  assistance Sitting-balance support: Feet supported Sitting balance-Leahy Scale: Good     Standing balance support: During functional activity;Bilateral upper extremity supported Standing balance-Leahy Scale: Fair                              Cognition Arousal/Alertness: Awake/alert Behavior During Therapy: WFL for tasks assessed/performed Overall Cognitive Status: Within Functional Limits for tasks assessed                                        Exercises Total Joint Exercises Ankle Circles/Pumps: AROM;Both;Seated;10 reps Quad Sets: AROM;Left;5 reps;Seated Short Arc Quad: AROM;Left;10 reps;Supine Heel Slides: AROM;Left;10 reps;Supine Hip ABduction/ADduction: AROM;Left;10 reps;Supine Straight Leg Raises: AROM;Left;5 reps;Supine Long Arc Quad: AROM;Left;10 reps;Seated Knee Flexion: AAROM;Left;10 reps;Seated Goniometric ROM: 0-60* AAROM L knee    General Comments        Pertinent Vitals/Pain Pain Assessment: 0-10 Pain Score: 3  Pain Location: Lt knee Pain Descriptors / Indicators: Aching;Discomfort Pain Intervention(s): Limited activity within patient's tolerance;Monitored during session;Premedicated before session;Ice applied    Home Living                      Prior Function            PT Goals (current goals can now be found in the care plan section) Acute Rehab PT Goals Patient Stated Goal: get back to water aerobics PT Goal Formulation: With patient Progress towards PT goals: Progressing toward goals    Frequency    7X/week  PT Plan Current plan remains appropriate    Co-evaluation              AM-PAC PT "6 Clicks" Mobility   Outcome Measure  Help needed turning from your back to your side while in a flat bed without using bedrails?: None Help needed moving from lying on your back to sitting on the side of a flat bed without using bedrails?: None Help needed moving to and from a bed to a chair  (including a wheelchair)?: None Help needed standing up from a chair using your arms (e.g., wheelchair or bedside chair)?: None Help needed to walk in hospital room?: None Help needed climbing 3-5 steps with a railing? : A Little 6 Click Score: 23    End of Session Equipment Utilized During Treatment: Gait belt Activity Tolerance: Patient tolerated treatment well Patient left: in chair;with call bell/phone within reach;with chair alarm set Nurse Communication: Mobility status PT Visit Diagnosis: Muscle weakness (generalized) (M62.81);Difficulty in walking, not elsewhere classified (R26.2)     Time: 3716-9678 PT Time Calculation (min) (ACUTE ONLY): 27 min  Charges:  $Gait Training: 8-22 mins $Therapeutic Exercise: 8-22 mins                     Blondell Reveal Kistler PT 12/27/2019  Acute Rehabilitation Services Pager [phone] Office [phone]

## 2019-12-28 DIAGNOSIS — Z96652 Presence of left artificial knee joint: Secondary | ICD-10-CM | POA: Diagnosis not present

## 2019-12-28 DIAGNOSIS — M25562 Pain in left knee: Secondary | ICD-10-CM | POA: Diagnosis not present

## 2019-12-28 DIAGNOSIS — R2689 Other abnormalities of gait and mobility: Secondary | ICD-10-CM | POA: Diagnosis not present

## 2019-12-28 DIAGNOSIS — M6281 Muscle weakness (generalized): Secondary | ICD-10-CM | POA: Diagnosis not present

## 2019-12-28 DIAGNOSIS — M25662 Stiffness of left knee, not elsewhere classified: Secondary | ICD-10-CM | POA: Diagnosis not present

## 2020-01-02 DIAGNOSIS — M25662 Stiffness of left knee, not elsewhere classified: Secondary | ICD-10-CM | POA: Diagnosis not present

## 2020-01-02 DIAGNOSIS — M6281 Muscle weakness (generalized): Secondary | ICD-10-CM | POA: Diagnosis not present

## 2020-01-02 DIAGNOSIS — Z96652 Presence of left artificial knee joint: Secondary | ICD-10-CM | POA: Diagnosis not present

## 2020-01-02 DIAGNOSIS — R2689 Other abnormalities of gait and mobility: Secondary | ICD-10-CM | POA: Diagnosis not present

## 2020-01-02 DIAGNOSIS — M25562 Pain in left knee: Secondary | ICD-10-CM | POA: Diagnosis not present

## 2020-01-05 DIAGNOSIS — R2689 Other abnormalities of gait and mobility: Secondary | ICD-10-CM | POA: Diagnosis not present

## 2020-01-05 DIAGNOSIS — M6281 Muscle weakness (generalized): Secondary | ICD-10-CM | POA: Diagnosis not present

## 2020-01-05 DIAGNOSIS — M25562 Pain in left knee: Secondary | ICD-10-CM | POA: Diagnosis not present

## 2020-01-05 DIAGNOSIS — M25662 Stiffness of left knee, not elsewhere classified: Secondary | ICD-10-CM | POA: Diagnosis not present

## 2020-01-05 DIAGNOSIS — Z96652 Presence of left artificial knee joint: Secondary | ICD-10-CM | POA: Diagnosis not present

## 2020-01-09 DIAGNOSIS — R2689 Other abnormalities of gait and mobility: Secondary | ICD-10-CM | POA: Diagnosis not present

## 2020-01-09 DIAGNOSIS — M25662 Stiffness of left knee, not elsewhere classified: Secondary | ICD-10-CM | POA: Diagnosis not present

## 2020-01-09 DIAGNOSIS — M25562 Pain in left knee: Secondary | ICD-10-CM | POA: Diagnosis not present

## 2020-01-09 DIAGNOSIS — Z96652 Presence of left artificial knee joint: Secondary | ICD-10-CM | POA: Diagnosis not present

## 2020-01-11 DIAGNOSIS — M25562 Pain in left knee: Secondary | ICD-10-CM | POA: Diagnosis not present

## 2020-01-11 DIAGNOSIS — R2689 Other abnormalities of gait and mobility: Secondary | ICD-10-CM | POA: Diagnosis not present

## 2020-01-11 DIAGNOSIS — Z96652 Presence of left artificial knee joint: Secondary | ICD-10-CM | POA: Diagnosis not present

## 2020-01-11 DIAGNOSIS — M25662 Stiffness of left knee, not elsewhere classified: Secondary | ICD-10-CM | POA: Diagnosis not present

## 2020-01-12 DIAGNOSIS — M818 Other osteoporosis without current pathological fracture: Secondary | ICD-10-CM | POA: Diagnosis not present

## 2020-01-12 DIAGNOSIS — M47816 Spondylosis without myelopathy or radiculopathy, lumbar region: Secondary | ICD-10-CM | POA: Diagnosis not present

## 2020-01-12 DIAGNOSIS — M8589 Other specified disorders of bone density and structure, multiple sites: Secondary | ICD-10-CM | POA: Diagnosis not present

## 2020-01-12 DIAGNOSIS — Z1231 Encounter for screening mammogram for malignant neoplasm of breast: Secondary | ICD-10-CM | POA: Diagnosis not present

## 2020-01-15 DIAGNOSIS — Z96652 Presence of left artificial knee joint: Secondary | ICD-10-CM | POA: Diagnosis not present

## 2020-01-15 DIAGNOSIS — M25662 Stiffness of left knee, not elsewhere classified: Secondary | ICD-10-CM | POA: Diagnosis not present

## 2020-01-15 DIAGNOSIS — R2689 Other abnormalities of gait and mobility: Secondary | ICD-10-CM | POA: Diagnosis not present

## 2020-01-15 DIAGNOSIS — M25562 Pain in left knee: Secondary | ICD-10-CM | POA: Diagnosis not present

## 2020-01-18 DIAGNOSIS — Z96652 Presence of left artificial knee joint: Secondary | ICD-10-CM | POA: Diagnosis not present

## 2020-01-18 DIAGNOSIS — M25562 Pain in left knee: Secondary | ICD-10-CM | POA: Diagnosis not present

## 2020-01-18 DIAGNOSIS — M25662 Stiffness of left knee, not elsewhere classified: Secondary | ICD-10-CM | POA: Diagnosis not present

## 2020-01-18 DIAGNOSIS — R2689 Other abnormalities of gait and mobility: Secondary | ICD-10-CM | POA: Diagnosis not present

## 2020-01-22 DIAGNOSIS — Z96652 Presence of left artificial knee joint: Secondary | ICD-10-CM | POA: Diagnosis not present

## 2020-01-22 DIAGNOSIS — M25562 Pain in left knee: Secondary | ICD-10-CM | POA: Diagnosis not present

## 2020-01-22 DIAGNOSIS — M25662 Stiffness of left knee, not elsewhere classified: Secondary | ICD-10-CM | POA: Diagnosis not present

## 2020-01-22 DIAGNOSIS — R2689 Other abnormalities of gait and mobility: Secondary | ICD-10-CM | POA: Diagnosis not present

## 2020-01-25 DIAGNOSIS — M25562 Pain in left knee: Secondary | ICD-10-CM | POA: Diagnosis not present

## 2020-01-25 DIAGNOSIS — R2689 Other abnormalities of gait and mobility: Secondary | ICD-10-CM | POA: Diagnosis not present

## 2020-01-25 DIAGNOSIS — Z96652 Presence of left artificial knee joint: Secondary | ICD-10-CM | POA: Diagnosis not present

## 2020-01-25 DIAGNOSIS — M25662 Stiffness of left knee, not elsewhere classified: Secondary | ICD-10-CM | POA: Diagnosis not present

## 2020-01-29 DIAGNOSIS — Z96652 Presence of left artificial knee joint: Secondary | ICD-10-CM | POA: Diagnosis not present

## 2020-01-29 DIAGNOSIS — M25562 Pain in left knee: Secondary | ICD-10-CM | POA: Diagnosis not present

## 2020-01-29 DIAGNOSIS — M25662 Stiffness of left knee, not elsewhere classified: Secondary | ICD-10-CM | POA: Diagnosis not present

## 2020-01-29 DIAGNOSIS — R2689 Other abnormalities of gait and mobility: Secondary | ICD-10-CM | POA: Diagnosis not present

## 2020-01-31 DIAGNOSIS — R2689 Other abnormalities of gait and mobility: Secondary | ICD-10-CM | POA: Diagnosis not present

## 2020-01-31 DIAGNOSIS — M25662 Stiffness of left knee, not elsewhere classified: Secondary | ICD-10-CM | POA: Diagnosis not present

## 2020-01-31 DIAGNOSIS — M25562 Pain in left knee: Secondary | ICD-10-CM | POA: Diagnosis not present

## 2020-01-31 DIAGNOSIS — Z96652 Presence of left artificial knee joint: Secondary | ICD-10-CM | POA: Diagnosis not present

## 2020-02-06 DIAGNOSIS — R2689 Other abnormalities of gait and mobility: Secondary | ICD-10-CM | POA: Diagnosis not present

## 2020-02-06 DIAGNOSIS — M25662 Stiffness of left knee, not elsewhere classified: Secondary | ICD-10-CM | POA: Diagnosis not present

## 2020-02-06 DIAGNOSIS — M25562 Pain in left knee: Secondary | ICD-10-CM | POA: Diagnosis not present

## 2020-02-06 DIAGNOSIS — Z96652 Presence of left artificial knee joint: Secondary | ICD-10-CM | POA: Diagnosis not present

## 2020-02-08 DIAGNOSIS — R2689 Other abnormalities of gait and mobility: Secondary | ICD-10-CM | POA: Diagnosis not present

## 2020-02-08 DIAGNOSIS — M25662 Stiffness of left knee, not elsewhere classified: Secondary | ICD-10-CM | POA: Diagnosis not present

## 2020-02-08 DIAGNOSIS — Z96652 Presence of left artificial knee joint: Secondary | ICD-10-CM | POA: Diagnosis not present

## 2020-02-08 DIAGNOSIS — M25562 Pain in left knee: Secondary | ICD-10-CM | POA: Diagnosis not present

## 2020-02-08 DIAGNOSIS — M6281 Muscle weakness (generalized): Secondary | ICD-10-CM | POA: Diagnosis not present

## 2020-02-09 DIAGNOSIS — Z96652 Presence of left artificial knee joint: Secondary | ICD-10-CM | POA: Diagnosis not present

## 2020-02-09 DIAGNOSIS — Z471 Aftercare following joint replacement surgery: Secondary | ICD-10-CM | POA: Diagnosis not present

## 2020-02-29 DIAGNOSIS — N6001 Solitary cyst of right breast: Secondary | ICD-10-CM | POA: Diagnosis not present

## 2020-02-29 DIAGNOSIS — R928 Other abnormal and inconclusive findings on diagnostic imaging of breast: Secondary | ICD-10-CM | POA: Diagnosis not present

## 2020-02-29 DIAGNOSIS — N6489 Other specified disorders of breast: Secondary | ICD-10-CM | POA: Diagnosis not present

## 2020-03-28 DIAGNOSIS — R1012 Left upper quadrant pain: Secondary | ICD-10-CM | POA: Diagnosis not present

## 2020-03-28 DIAGNOSIS — E039 Hypothyroidism, unspecified: Secondary | ICD-10-CM | POA: Insufficient documentation

## 2020-03-28 DIAGNOSIS — I499 Cardiac arrhythmia, unspecified: Secondary | ICD-10-CM | POA: Insufficient documentation

## 2020-03-28 DIAGNOSIS — K219 Gastro-esophageal reflux disease without esophagitis: Secondary | ICD-10-CM | POA: Insufficient documentation

## 2020-03-28 HISTORY — DX: Cardiac arrhythmia, unspecified: I49.9

## 2020-04-03 DIAGNOSIS — R1012 Left upper quadrant pain: Secondary | ICD-10-CM | POA: Diagnosis not present

## 2020-04-03 DIAGNOSIS — R0781 Pleurodynia: Secondary | ICD-10-CM | POA: Diagnosis not present

## 2020-05-02 DIAGNOSIS — G2581 Restless legs syndrome: Secondary | ICD-10-CM | POA: Diagnosis not present

## 2020-05-02 DIAGNOSIS — R1902 Left upper quadrant abdominal swelling, mass and lump: Secondary | ICD-10-CM | POA: Diagnosis not present

## 2020-05-02 DIAGNOSIS — Z79899 Other long term (current) drug therapy: Secondary | ICD-10-CM | POA: Diagnosis not present

## 2020-05-02 DIAGNOSIS — L989 Disorder of the skin and subcutaneous tissue, unspecified: Secondary | ICD-10-CM | POA: Diagnosis not present

## 2020-05-02 DIAGNOSIS — M549 Dorsalgia, unspecified: Secondary | ICD-10-CM | POA: Diagnosis not present

## 2020-05-02 DIAGNOSIS — R55 Syncope and collapse: Secondary | ICD-10-CM | POA: Diagnosis not present

## 2020-05-02 DIAGNOSIS — Z131 Encounter for screening for diabetes mellitus: Secondary | ICD-10-CM | POA: Diagnosis not present

## 2020-05-08 DIAGNOSIS — K8020 Calculus of gallbladder without cholecystitis without obstruction: Secondary | ICD-10-CM | POA: Diagnosis not present

## 2020-05-08 DIAGNOSIS — N281 Cyst of kidney, acquired: Secondary | ICD-10-CM | POA: Diagnosis not present

## 2020-05-08 DIAGNOSIS — R1902 Left upper quadrant abdominal swelling, mass and lump: Secondary | ICD-10-CM | POA: Diagnosis not present

## 2020-05-08 DIAGNOSIS — M7989 Other specified soft tissue disorders: Secondary | ICD-10-CM | POA: Diagnosis not present

## 2020-05-21 DIAGNOSIS — L299 Pruritus, unspecified: Secondary | ICD-10-CM | POA: Diagnosis not present

## 2020-05-21 DIAGNOSIS — D485 Neoplasm of uncertain behavior of skin: Secondary | ICD-10-CM | POA: Diagnosis not present

## 2020-05-21 DIAGNOSIS — L304 Erythema intertrigo: Secondary | ICD-10-CM | POA: Diagnosis not present

## 2020-05-21 DIAGNOSIS — L820 Inflamed seborrheic keratosis: Secondary | ICD-10-CM | POA: Diagnosis not present

## 2020-05-21 DIAGNOSIS — L219 Seborrheic dermatitis, unspecified: Secondary | ICD-10-CM | POA: Diagnosis not present

## 2020-05-21 DIAGNOSIS — L821 Other seborrheic keratosis: Secondary | ICD-10-CM | POA: Diagnosis not present

## 2020-05-27 DIAGNOSIS — C44300 Unspecified malignant neoplasm of skin of unspecified part of face: Secondary | ICD-10-CM | POA: Insufficient documentation

## 2020-05-29 ENCOUNTER — Ambulatory Visit: Payer: Medicare HMO | Admitting: Cardiology

## 2020-05-29 ENCOUNTER — Encounter: Payer: Self-pay | Admitting: Cardiology

## 2020-05-29 ENCOUNTER — Other Ambulatory Visit: Payer: Self-pay

## 2020-05-29 VITALS — BP 150/88 | HR 63 | Ht 63.0 in | Wt 166.8 lb

## 2020-05-29 DIAGNOSIS — Z5181 Encounter for therapeutic drug level monitoring: Secondary | ICD-10-CM | POA: Insufficient documentation

## 2020-05-29 DIAGNOSIS — R011 Cardiac murmur, unspecified: Secondary | ICD-10-CM

## 2020-05-29 DIAGNOSIS — I493 Ventricular premature depolarization: Secondary | ICD-10-CM | POA: Diagnosis not present

## 2020-05-29 DIAGNOSIS — E782 Mixed hyperlipidemia: Secondary | ICD-10-CM | POA: Diagnosis not present

## 2020-05-29 DIAGNOSIS — Z79899 Other long term (current) drug therapy: Secondary | ICD-10-CM

## 2020-05-29 HISTORY — DX: Encounter for therapeutic drug level monitoring: Z51.81

## 2020-05-29 HISTORY — DX: Cardiac murmur, unspecified: R01.1

## 2020-05-29 HISTORY — DX: Other long term (current) drug therapy: Z79.899

## 2020-05-29 NOTE — Progress Notes (Signed)
Cardiology Office Note:    Date:  05/29/2020   ID:  Laura Singleton, DOB 12-06-1945, MRN [id number]  PCP:  Laura Coop, FNP  Cardiologist:  Laura Lindau, MD   Referring MD: No ref. provider found    ASSESSMENT:    1. PVC's (premature ventricular contractions)   2. Encounter for monitoring flecainide therapy   3. Mixed hyperlipidemia   4. Murmur   5. Cardiac murmur    PLAN:    In order of problems listed above:  1. Frequent PVCs: I discussed my findings with the patient extensively.  Patient will continue with flecainide.  EKG done today reveals sinus rhythm is largely unremarkable with normal QRS.  I discussed this with the patient at length and questions were answered to her satisfaction.  I had mentioned to her the last time that in view of his history of frequent PVCs and flecainide therapy I will refer her to my electrophysiology colleague Dr. Curt Singleton for the needful.  She is agreeable. 2. Cardiac murmur: Echocardiogram will be done to assess murmur heard on auscultation. 3. Mixed dyslipidemia: Diet was discussed and weight reduction and exercise stressed.  She is going to her primary care provider for blood work tomorrow.  She will send [place] a copy of this. 4. Patient will be seen in follow-up appointment in 6 months or earlier if the patient has any concerns 5.    Medication Adjustments/Labs and Tests Ordered: Current medicines are reviewed at length with the patient today.  Concerns regarding medicines are outlined above.  Orders Placed This Encounter  Procedures  . Ambulatory referral to Cardiac Electrophysiology  . EKG 12-Lead  . ECHOCARDIOGRAM COMPLETE   No orders of the defined types were placed in this encounter.    No chief complaint on file.    History of Present Illness:    Laura Singleton is a 75 y.o. female.  Patient has past medical history of frequent PVCs.  She tells me about 30 years ago she was initiated on flecainide therapy and has tolerated it well.   It has suppressed her PVCs well.  In the past several years she has had no problems.  She underwent stress testing recently for preop assessment and stress testing was unremarkable with preserved ejection fraction.  Now she has issues about flecainide and continuing it.  She is here for follow-up.  Past Medical History:  Diagnosis Date  . Arthritis   . Cardiac arrhythmia 03/28/2020  . Cervical myelopathy ([place]) 11/05/2016  . Chronic pain of left knee 11/17/2018  . Depression   . Dysrhythmia   . GERD (gastroesophageal reflux disease)   . History of total knee arthroplasty 12/26/2019  . Hyperlipidemia 11/05/2016  . Hypothyroidism   . Myelopathy of cervical spinal cord with cervical radiculopathy ([place]) 11/05/2016  . Preoperative cardiovascular examination 11/21/2019  . PVC (premature ventricular contraction) 11/05/2016  . PVC's (premature ventricular contractions)   . S/P total knee arthroplasty, left 12/26/2019  . Skin cancer of face   . Ventricular premature beats 11/05/2016  . Wolff-Parkinson-White (WPW) pattern 11/05/2016  . Wolff-Parkinson-White (WPW) syndrome   . WPW (Wolff-Parkinson-White syndrome) 11/05/2016    Past Surgical History:  Procedure Laterality Date  . ANTERIOR CERVICAL DECOMP/DISCECTOMY FUSION N/A 11/05/2016   Procedure: ANTERIOR CERVICAL DECOMPRESSION/DISCECTOMY FUSION  - CERVICAL THREE-FOUR, CERVICAL FIVE-SIX, CERVICAL SIX -SEVEN;  Surgeon: Kary Kos, MD;  Location: [facility];  Service: Neurosurgery;  Laterality: N/A;  . COLONOSCOPY    . EYE SURGERY  both eyes lens implants  . SKIN CANCER EXCISION     Face  . TONSILLECTOMY    . TOTAL KNEE ARTHROPLASTY Left 12/26/2019   Procedure: TOTAL KNEE ARTHROPLASTY;  Surgeon: Paralee Cancel, MD;  Location: [facility];  Service: Orthopedics;  Laterality: Left;  70 mins  . TUBAL LIGATION      Current Medications: Current Meds  Medication Sig  . atorvastatin (LIPITOR) 20 MG tablet Take 20 mg by mouth at bedtime.  . calcium carbonate  (CALCIUM 600) 600 MG TABS tablet Take 600 mg by mouth 2 (two) times daily with a meal.  . flecainide (TAMBOCOR) 100 MG tablet Take 100 mg by mouth 2 (two) times daily.  . fluconazole (DIFLUCAN) 200 MG tablet Take 200 mg by mouth once a week.  . levothyroxine (SYNTHROID) 100 MCG tablet Take 100 mcg by mouth daily before breakfast.  . Multiple Vitamin (MULTIVITAMIN WITH MINERALS) TABS tablet Take 1 tablet by mouth daily. Centrum Silver  . omeprazole (PRILOSEC) 40 MG capsule Take 40 mg by mouth daily.  . polyethylene glycol (MIRALAX / GLYCOLAX) 17 g packet Take 17 g by mouth as needed for mild constipation.  Marland Kitchen rOPINIRole (REQUIP) 1 MG tablet Take 1-4 mg by mouth at bedtime.  . sertraline (ZOLOFT) 100 MG tablet Take 200 mg by mouth daily.     Allergies:   Bee venom, Parafon forte dsc [chlorzoxazone], and Sulfa antibiotics   Social History   Socioeconomic History  . Marital status: Married    Spouse name: Not on file  . Number of children: Not on file  . Years of education: Not on file  . Highest education level: Not on file  Occupational History  . Not on file  Tobacco Use  . Smoking status: Former Smoker    Types: Cigarettes  . Smokeless tobacco: Never Used  . Tobacco comment: Quit 40 years ago  Vaping Use  . Vaping Use: Never used  Substance and Sexual Activity  . Alcohol use: No  . Drug use: No  . Sexual activity: Not on file  Other Topics Concern  . Not on file  Social History Narrative  . Not on file   Social Determinants of Health   Financial Resource Strain: Not on file  Food Insecurity: Not on file  Transportation Needs: Not on file  Physical Activity: Not on file  Stress: Not on file  Social Connections: Not on file     Family History: The patient's family history includes High Cholesterol in her mother; High blood pressure in her brother and mother.  ROS:   Please see the history of present illness.    All other systems reviewed and are  negative.  EKGs/Labs/Other Studies Reviewed:    The following studies were reviewed today: Study Highlights   The left ventricular ejection fraction is normal (55-65%).  Nuclear stress EF: 64%.  There was no ST segment deviation noted during stress.  No T wave inversion was noted during stress.  The study is normal.  This is a low risk study.     Recent Labs: 11/21/2019: Magnesium 2.3; TSH 5.630 12/27/2019: BUN 20; Creatinine, Ser 0.97; Hemoglobin 9.6; Platelets 216; Potassium 3.6; Sodium 140  Recent Lipid Panel No results found for: CHOL, TRIG, HDL, CHOLHDL, VLDL, LDLCALC, LDLDIRECT  Physical Exam:    VS:  BP (!) 150/88   Pulse 63   Ht 5\' 3"  (1.6 m)   Wt 166 lb 12.8 oz (75.7 kg)   SpO2 96%   BMI  29.55 kg/m     Wt Readings from Last 3 Encounters:  05/29/20 166 lb 12.8 oz (75.7 kg)  12/26/19 163 lb 5.8 oz (74.1 kg)  12/20/19 163 lb 5 oz (74.1 kg)     GEN: Patient is in no acute distress HEENT: Normal NECK: No JVD; No carotid bruits LYMPHATICS: No lymphadenopathy CARDIAC: Hear sounds regular, 2/6 systolic murmur at the apex. RESPIRATORY:  Clear to auscultation without rales, wheezing or rhonchi  ABDOMEN: Soft, non-tender, non-distended MUSCULOSKELETAL:  No edema; No deformity  SKIN: Warm and dry NEUROLOGIC:  Alert and oriented x 3 PSYCHIATRIC:  Normal affect   Signed, Laura Lindau, MD  05/29/2020 12:42 PM    [facility]

## 2020-05-29 NOTE — Patient Instructions (Signed)
Medication Instructions:  No medication changes. *If you need a refill on your cardiac medications before your next appointment, please call your pharmacy*   Lab Work: None ordered If you have labs (blood work) drawn today and your tests are completely normal, you will receive your results only by: Marland Kitchen MyChart Message (if you have MyChart) OR . A paper copy in the mail If you have any lab test that is abnormal or we need to change your treatment, we will call you to review the results.   Testing/Procedures:  Echocardiogram An echocardiogram is a test that uses sound waves (ultrasound) to produce images of the heart. Images from an echocardiogram can provide important information about:  Heart size and shape.  The size and thickness and movement of your heart's walls.  Heart muscle function and strength.  Heart valve function or if you have stenosis. Stenosis is when the heart valves are too narrow.  If blood is flowing backward through the heart valves (regurgitation).  A tumor or infectious growth around the heart valves.  Areas of heart muscle that are not working well because of poor blood flow or injury from a heart attack.  Aneurysm detection. An aneurysm is a weak or damaged part of an artery wall. The wall bulges out from the normal force of blood pumping through the body. Tell a health care provider about:  Any allergies you have.  All medicines you are taking, including vitamins, herbs, eye drops, creams, and over-the-counter medicines.  Any blood disorders you have.  Any surgeries you have had.  Any medical conditions you have.  Whether you are pregnant or may be pregnant. What are the risks? Generally, this is a safe test. However, problems may occur, including an allergic reaction to dye (contrast) that may be used during the test. What happens before the test? No specific preparation is needed. You may eat and drink normally. What happens during the  test?  You will take off your clothes from the waist up and put on a hospital gown.  Electrodes or electrocardiogram (ECG)patches may be placed on your chest. The electrodes or patches are then connected to a device that monitors your heart rate and rhythm.  You will lie down on a table for an ultrasound exam. A gel will be applied to your chest to help sound waves pass through your skin.  A handheld device, called a transducer, will be pressed against your chest and moved over your heart. The transducer produces sound waves that travel to your heart and bounce back (or "echo" back) to the transducer. These sound waves will be captured in real-time and changed into images of your heart that can be viewed on a video monitor. The images will be recorded on a computer and reviewed by your health care provider.  You may be asked to change positions or hold your breath for a short time. This makes it easier to get different views or better views of your heart.  In some cases, you may receive contrast through an IV in one of your veins. This can improve the quality of the pictures from your heart. The procedure may vary among health care providers and hospitals.   What can I expect after the test? You may return to your normal, everyday life, including diet, activities, and medicines, unless your health care provider tells you not to do that. Follow these instructions at home:  It is up to you to get the results of your test. Ask  your health care provider, or the department that is doing the test, when your results will be ready.  Keep all follow-up visits. This is important. Summary  An echocardiogram is a test that uses sound waves (ultrasound) to produce images of the heart.  Images from an echocardiogram can provide important information about the size and shape of your heart, heart muscle function, heart valve function, and other possible heart problems.  You do not need to do anything to  prepare before this test. You may eat and drink normally.  After the echocardiogram is completed, you may return to your normal, everyday life, unless your health care provider tells you not to do that. This information is not intended to replace advice given to you by your health care provider. Make sure you discuss any questions you have with your health care provider. Document Revised: 10/17/2019 Document Reviewed: 10/17/2019 Elsevier Patient Education  2021 [place]: At [facility], you and your health needs are our priority.  As part of our continuing mission to provide you with exceptional heart care, we have created designated Provider Care Teams.  These Care Teams include your primary Cardiologist (physician) and Advanced Practice Providers (APPs -  Physician Assistants and Nurse Practitioners) who all work together to provide you with the care you need, when you need it.  We recommend signing up for the patient portal called "MyChart".  Sign up information is provided on this After Visit Summary.  MyChart is used to connect with patients for Virtual Visits (Telemedicine).  Patients are able to view lab/test results, encounter notes, upcoming appointments, etc.  Non-urgent messages can be sent to your provider as well.   To learn more about what you can do with MyChart, go to [email or web address].    Your next appointment:   6 day(s)  The format for your next appointment:   In Person  Provider:   [name], MD   Other Instructions  Echocardiogram An echocardiogram is a test that uses sound waves (ultrasound) to produce images of the heart. Images from an echocardiogram can provide important information about:  Heart size and shape.  The size and thickness and movement of your heart's walls.  Heart muscle function and strength.  Heart valve function or if you have stenosis. Stenosis is when the heart valves are too narrow.  If blood is  flowing backward through the heart valves (regurgitation).  A tumor or infectious growth around the heart valves.  Areas of heart muscle that are not working well because of poor blood flow or injury from a heart attack.  Aneurysm detection. An aneurysm is a weak or damaged part of an artery wall. The wall bulges out from the normal force of blood pumping through the body. Tell a health care provider about:  Any allergies you have.  All medicines you are taking, including vitamins, herbs, eye drops, creams, and over-the-counter medicines.  Any blood disorders you have.  Any surgeries you have had.  Any medical conditions you have.  Whether you are pregnant or may be pregnant. What are the risks? Generally, this is a safe test. However, problems may occur, including an allergic reaction to dye (contrast) that may be used during the test. What happens before the test? No specific preparation is needed. You may eat and drink normally. What happens during the test?  You will take off your clothes from the waist up and put on a hospital gown.  Electrodes or electrocardiogram (ECG)patches  may be placed on your chest. The electrodes or patches are then connected to a device that monitors your heart rate and rhythm.  You will lie down on a table for an ultrasound exam. A gel will be applied to your chest to help sound waves pass through your skin.  A handheld device, called a transducer, will be pressed against your chest and moved over your heart. The transducer produces sound waves that travel to your heart and bounce back (or "echo" back) to the transducer. These sound waves will be captured in real-time and changed into images of your heart that can be viewed on a video monitor. The images will be recorded on a computer and reviewed by your health care provider.  You may be asked to change positions or hold your breath for a short time. This makes it easier to get different views or  better views of your heart.  In some cases, you may receive contrast through an IV in one of your veins. This can improve the quality of the pictures from your heart. The procedure may vary among health care providers and hospitals.   What can I expect after the test? You may return to your normal, everyday life, including diet, activities, and medicines, unless your health care provider tells you not to do that. Follow these instructions at home:  It is up to you to get the results of your test. Ask your health care provider, or the department that is doing the test, when your results will be ready.  Keep all follow-up visits. This is important. Summary  An echocardiogram is a test that uses sound waves (ultrasound) to produce images of the heart.  Images from an echocardiogram can provide important information about the size and shape of your heart, heart muscle function, heart valve function, and other possible heart problems.  You do not need to do anything to prepare before this test. You may eat and drink normally.  After the echocardiogram is completed, you may return to your normal, everyday life, unless your health care provider tells you not to do that. This information is not intended to replace advice given to you by your health care provider. Make sure you discuss any questions you have with your health care provider. Document Revised: 10/17/2019 Document Reviewed: 10/17/2019 Elsevier Patient Education  2021 [employer].

## 2020-05-30 DIAGNOSIS — K8020 Calculus of gallbladder without cholecystitis without obstruction: Secondary | ICD-10-CM | POA: Diagnosis not present

## 2020-05-30 DIAGNOSIS — E785 Hyperlipidemia, unspecified: Secondary | ICD-10-CM | POA: Diagnosis not present

## 2020-05-30 DIAGNOSIS — R198 Other specified symptoms and signs involving the digestive system and abdomen: Secondary | ICD-10-CM | POA: Diagnosis not present

## 2020-05-30 DIAGNOSIS — M5442 Lumbago with sciatica, left side: Secondary | ICD-10-CM | POA: Diagnosis not present

## 2020-05-30 DIAGNOSIS — M5441 Lumbago with sciatica, right side: Secondary | ICD-10-CM | POA: Diagnosis not present

## 2020-05-30 DIAGNOSIS — E039 Hypothyroidism, unspecified: Secondary | ICD-10-CM | POA: Diagnosis not present

## 2020-05-30 DIAGNOSIS — Z79899 Other long term (current) drug therapy: Secondary | ICD-10-CM | POA: Diagnosis not present

## 2020-05-30 DIAGNOSIS — F3341 Major depressive disorder, recurrent, in partial remission: Secondary | ICD-10-CM | POA: Diagnosis not present

## 2020-05-30 DIAGNOSIS — Z0000 Encounter for general adult medical examination without abnormal findings: Secondary | ICD-10-CM | POA: Diagnosis not present

## 2020-05-31 DIAGNOSIS — M549 Dorsalgia, unspecified: Secondary | ICD-10-CM | POA: Diagnosis not present

## 2020-06-04 DIAGNOSIS — K8020 Calculus of gallbladder without cholecystitis without obstruction: Secondary | ICD-10-CM | POA: Diagnosis not present

## 2020-06-10 DIAGNOSIS — M5416 Radiculopathy, lumbar region: Secondary | ICD-10-CM | POA: Diagnosis not present

## 2020-06-10 DIAGNOSIS — G894 Chronic pain syndrome: Secondary | ICD-10-CM | POA: Diagnosis not present

## 2020-06-10 DIAGNOSIS — M961 Postlaminectomy syndrome, not elsewhere classified: Secondary | ICD-10-CM | POA: Diagnosis not present

## 2020-06-10 DIAGNOSIS — M5440 Lumbago with sciatica, unspecified side: Secondary | ICD-10-CM | POA: Diagnosis not present

## 2020-06-13 DIAGNOSIS — K8020 Calculus of gallbladder without cholecystitis without obstruction: Secondary | ICD-10-CM | POA: Diagnosis not present

## 2020-06-13 DIAGNOSIS — Z6824 Body mass index (BMI) 24.0-24.9, adult: Secondary | ICD-10-CM | POA: Diagnosis not present

## 2020-06-13 DIAGNOSIS — F3341 Major depressive disorder, recurrent, in partial remission: Secondary | ICD-10-CM | POA: Diagnosis not present

## 2020-06-13 DIAGNOSIS — M5137 Other intervertebral disc degeneration, lumbosacral region: Secondary | ICD-10-CM | POA: Diagnosis not present

## 2020-06-19 ENCOUNTER — Ambulatory Visit (INDEPENDENT_AMBULATORY_CARE_PROVIDER_SITE_OTHER): Payer: Medicare HMO

## 2020-06-19 ENCOUNTER — Other Ambulatory Visit: Payer: Self-pay

## 2020-06-19 DIAGNOSIS — M48061 Spinal stenosis, lumbar region without neurogenic claudication: Secondary | ICD-10-CM | POA: Diagnosis not present

## 2020-06-19 DIAGNOSIS — M545 Low back pain, unspecified: Secondary | ICD-10-CM | POA: Diagnosis not present

## 2020-06-19 DIAGNOSIS — R011 Cardiac murmur, unspecified: Secondary | ICD-10-CM | POA: Diagnosis not present

## 2020-06-19 DIAGNOSIS — M5137 Other intervertebral disc degeneration, lumbosacral region: Secondary | ICD-10-CM | POA: Diagnosis not present

## 2020-06-19 DIAGNOSIS — I493 Ventricular premature depolarization: Secondary | ICD-10-CM | POA: Diagnosis not present

## 2020-06-19 DIAGNOSIS — K8020 Calculus of gallbladder without cholecystitis without obstruction: Secondary | ICD-10-CM | POA: Diagnosis not present

## 2020-06-19 LAB — ECHOCARDIOGRAM COMPLETE
Area-P 1/2: 3.13 cm2
S' Lateral: 2.7 cm

## 2020-06-19 NOTE — Progress Notes (Signed)
Complete echocardiogram performed.  Jimmy Gustaf Mccarter RDCS, RVT  

## 2020-06-20 DIAGNOSIS — G5623 Lesion of ulnar nerve, bilateral upper limbs: Secondary | ICD-10-CM

## 2020-06-20 DIAGNOSIS — G959 Disease of spinal cord, unspecified: Secondary | ICD-10-CM | POA: Diagnosis not present

## 2020-06-20 DIAGNOSIS — Z6826 Body mass index (BMI) 26.0-26.9, adult: Secondary | ICD-10-CM | POA: Insufficient documentation

## 2020-06-20 HISTORY — DX: Body mass index (BMI) 26.0-26.9, adult: Z68.26

## 2020-06-20 HISTORY — DX: Lesion of ulnar nerve, bilateral upper limbs: G56.23

## 2020-06-26 ENCOUNTER — Other Ambulatory Visit: Payer: Self-pay | Admitting: Neurosurgery

## 2020-06-26 DIAGNOSIS — M4800 Spinal stenosis, site unspecified: Secondary | ICD-10-CM | POA: Diagnosis not present

## 2020-06-26 DIAGNOSIS — F3341 Major depressive disorder, recurrent, in partial remission: Secondary | ICD-10-CM | POA: Diagnosis not present

## 2020-06-26 DIAGNOSIS — Z6825 Body mass index (BMI) 25.0-25.9, adult: Secondary | ICD-10-CM | POA: Diagnosis not present

## 2020-06-26 DIAGNOSIS — M5124 Other intervertebral disc displacement, thoracic region: Secondary | ICD-10-CM | POA: Diagnosis not present

## 2020-06-26 DIAGNOSIS — G5623 Lesion of ulnar nerve, bilateral upper limbs: Secondary | ICD-10-CM | POA: Diagnosis not present

## 2020-07-09 ENCOUNTER — Telehealth: Payer: Self-pay

## 2020-07-09 NOTE — Telephone Encounter (Signed)
Spoke with patient regarding results and recommendation.  Patient verbalizes understanding and is agreeable to plan of care. Advised patient to call back with any issues or concerns.  

## 2020-07-09 NOTE — Telephone Encounter (Signed)
-----   Message from Jenean Lindau, MD sent at 07/08/2020  5:54 PM EDT ----- The results of the study is unremarkable. Please inform patient. I will discuss in detail at next appointment. Cc  primary care/referring physician Jenean Lindau, MD 07/08/2020 5:54 PM

## 2020-07-10 DIAGNOSIS — M79641 Pain in right hand: Secondary | ICD-10-CM | POA: Diagnosis not present

## 2020-07-10 DIAGNOSIS — M79642 Pain in left hand: Secondary | ICD-10-CM | POA: Diagnosis not present

## 2020-07-12 NOTE — Progress Notes (Signed)
Surgical Instructions    Your procedure is scheduled on Wednesday, 5/11.  Report to [facility] Main Entrance "A" at 6:30 A.M., then check in with the Admitting office.  Call this number if you have problems the morning of surgery:  [phone]   If you have any questions prior to your surgery date call [phone]: Open Monday-Friday 8am-4pm    Remember:  Do not eat or drink after midnight the night before your surgery   Take these medicines the morning of surgery with A SIP OF WATER   Atorvastatin (Lipitor)  Cyclobenzaprine (Flexeril)  Levothyroxine (Synthroid)  Flecainide (Tambocor)  Fluconazole (Diflucan)  Omeprazole (Prilosec)  Sertraline (Zoloft)  As of today, STOP taking any Aspirin (unless otherwise instructed by your surgeon) Aleve, Naproxen, Ibuprofen, Motrin, Advil, Goody's, BC's, all herbal medications, fish oil, and all vitamins.                     Do not wear jewelry, make up, or nail polish            Do not wear lotions, powders, perfumes, or deodorant.            Do not shave 48 hours prior to surgery.              Do not bring valuables to the hospital.            [facility] is not responsible for any belongings or valuables.  Do NOT Smoke (Tobacco/Vaping) or drink Alcohol 24 hours prior to your procedure If you use a CPAP at night, you may bring all equipment for your overnight stay.   Contacts, glasses, dentures or bridgework may not be worn into surgery, please bring cases for these belongings   For patients admitted to the hospital, discharge time will be determined by your treatment team.   Patients discharged the day of surgery will not be allowed to drive home, and someone needs to stay with them for 24 hours.    Special instructions:   [place]- Preparing For Surgery  Before surgery, you can play an important role. Because skin is not sterile, your skin needs to be as free of germs as possible. You can reduce the number of germs on your  skin by washing with CHG (chlorahexidine gluconate) Soap before surgery.  CHG is an antiseptic cleaner which kills germs and bonds with the skin to continue killing germs even after washing.    Oral Hygiene is also important to reduce your risk of infection.  Remember - BRUSH YOUR TEETH THE MORNING OF SURGERY WITH YOUR REGULAR TOOTHPASTE  Please do not use if you have an allergy to CHG or antibacterial soaps. If your skin becomes reddened/irritated stop using the CHG.  Do not shave (including legs and underarms) for at least 48 hours prior to first CHG shower. It is OK to shave your face.  Please follow these instructions carefully.   1. Shower the NIGHT BEFORE SURGERY and the MORNING OF SURGERY  2. If you chose to wash your hair, wash your hair first as usual with your normal shampoo.  3. After you shampoo, rinse your hair and body thoroughly to remove the shampoo.  4. Wash Face and genitals (private parts) with your normal soap.   5.  Shower the NIGHT BEFORE SURGERY and the MORNING OF SURGERY with CHG Soap.   6. Use CHG Soap as you would any other liquid soap. You can apply CHG directly to the skin  and wash gently with a scrungie or a clean washcloth.   7. Apply the CHG Soap to your body ONLY FROM THE NECK DOWN.  Do not use on open wounds or open sores. Avoid contact with your eyes, ears, mouth and genitals (private parts). Wash Face and genitals (private parts)  with your normal soap.   8. Wash thoroughly, paying special attention to the area where your surgery will be performed.  9. Thoroughly rinse your body with warm water from the neck down.  10. DO NOT shower/wash with your normal soap after using and rinsing off the CHG Soap.  11. Pat yourself dry with a CLEAN TOWEL.  12. Wear CLEAN PAJAMAS to bed the night before surgery  13. Place CLEAN SHEETS on your bed the night before your surgery  14. DO NOT SLEEP WITH PETS.   Day of Surgery: Take a shower with CHG soap. Wear  Clean/Comfortable clothing the morning of surgery Do not apply any deodorants/lotions.   Remember to brush your teeth WITH YOUR REGULAR TOOTHPASTE.   Please read over the following fact sheets that you were given.

## 2020-07-15 ENCOUNTER — Other Ambulatory Visit: Payer: Self-pay

## 2020-07-15 ENCOUNTER — Encounter (HOSPITAL_COMMUNITY)
Admission: RE | Admit: 2020-07-15 | Discharge: 2020-07-15 | Disposition: A | Discharge status: Home / Self Care | Payer: Medicare HMO | Source: Ambulatory Visit | Attending: Neurosurgery | Admitting: Neurosurgery

## 2020-07-15 DIAGNOSIS — G959 Disease of spinal cord, unspecified: Secondary | ICD-10-CM | POA: Diagnosis not present

## 2020-07-15 DIAGNOSIS — Z01812 Encounter for preprocedural laboratory examination: Secondary | ICD-10-CM | POA: Diagnosis not present

## 2020-07-15 DIAGNOSIS — Z20822 Contact with and (suspected) exposure to covid-19: Secondary | ICD-10-CM | POA: Diagnosis not present

## 2020-07-15 DIAGNOSIS — E039 Hypothyroidism, unspecified: Secondary | ICD-10-CM | POA: Diagnosis not present

## 2020-07-15 DIAGNOSIS — Z87891 Personal history of nicotine dependence: Secondary | ICD-10-CM | POA: Insufficient documentation

## 2020-07-15 DIAGNOSIS — Z79899 Other long term (current) drug therapy: Secondary | ICD-10-CM | POA: Insufficient documentation

## 2020-07-15 DIAGNOSIS — E785 Hyperlipidemia, unspecified: Secondary | ICD-10-CM | POA: Insufficient documentation

## 2020-07-15 LAB — BASIC METABOLIC PANEL
Anion gap: 6 (ref 5–15)
BUN: 17 mg/dL (ref 8–23)
CO2: 30 mmol/L (ref 22–32)
Calcium: 9.5 mg/dL (ref 8.9–10.3)
Chloride: 106 mmol/L (ref 98–111)
Creatinine, Ser: 1.11 mg/dL — ABNORMAL HIGH (ref 0.44–1.00)
GFR, Estimated: 52 mL/min — ABNORMAL LOW (ref >60)
Glucose, Bld: 90 mg/dL (ref 70–99)
Potassium: 4.7 mmol/L (ref 3.5–5.1)
Sodium: 142 mmol/L (ref 135–145)

## 2020-07-15 LAB — CBC
HCT: 39.4 % (ref 36.0–46.0)
Hemoglobin: 12.3 g/dL (ref 12.0–15.0)
MCH: 29 pg (ref 26.0–34.0)
MCHC: 31.2 g/dL (ref 30.0–36.0)
MCV: 92.9 fL (ref 80.0–100.0)
Platelets: 322 10*3/uL (ref 150–400)
RBC: 4.24 MIL/uL (ref 3.87–5.11)
RDW: 14.1 % (ref 11.5–15.5)
WBC: 8.4 10*3/uL (ref 4.0–10.5)
nRBC: 0 % (ref 0.0–0.2)

## 2020-07-15 LAB — SARS CORONAVIRUS 2 (TAT 6-24 HRS): SARS Coronavirus 2: NEGATIVE

## 2020-07-15 LAB — SURGICAL PCR SCREEN
MRSA, PCR: NEGATIVE
Staphylococcus aureus: NEGATIVE

## 2020-07-15 NOTE — Progress Notes (Signed)
PCP - [name], MD Cardiologist - [name], MD  PPM/ICD - Denies Chest x-ray - N/A EKG - 05/30/2020 Stress Test - 12/05/2019 ECHO - 0413/2022 Cardiac Cath - Denies  Sleep Study - Denies  Pt denies hx of Diabetes  Blood Thinner Instructions:N/A Aspirin Instructions:Pt denies  ERAS Protcol -N/A  COVID TEST- 07/15/2020 Pending   Anesthesia review: Yes, cardiac hx  Patient denies shortness of breath, fever, cough and chest pain at PAT appointment   All instructions explained to the patient, with a verbal understanding of the material. Patient agrees to go over the instructions while at home for a better understanding. Patient also instructed to self quarantine after being tested for COVID-19. The opportunity to ask questions was provided.

## 2020-07-16 DIAGNOSIS — M542 Cervicalgia: Secondary | ICD-10-CM | POA: Diagnosis not present

## 2020-07-16 DIAGNOSIS — G959 Disease of spinal cord, unspecified: Secondary | ICD-10-CM | POA: Diagnosis not present

## 2020-07-16 NOTE — Progress Notes (Signed)
Anesthesia Chart Review:  Case: [id number] Date/Time: 07/17/20 0815   Procedure: Laminectomy and Foraminotomy - bilateral - T10-T11 - L1-L2 (Bilateral Back)   Anesthesia type: General   Pre-op diagnosis: Myelopathy   Location: [facility] ROOM 20 / [facility]   Surgeons: Kary Kos, MD      DISCUSSION: Patient is a 75 year old female scheduled for the above procedure.  History includes former smoker, WPW, PVCs, hypothyroidism, HLD, GERD, TKA ( left 12/26/19), neck surgery (C3-4, C5-7 ACDF 11/05/16).   Last cardiology evaluation by Dr. Geraldo Pitter on 05/29/20. She has known WPW and PVC history. Previous EP evaluation with Virl Axe, MD in 2010 and with Skeet Latch, MD in 2018 for preoperative evaluation. She has been seeing Dr. Geraldo Pitter since 11/21/19. She has been on flecainide for symptomatic PVCs for many years. She had a normal nuclear stress test in 11/2019 and unremarkable echo in 06/2020 (to evaluate murmur; had mild TR). Since she remains on flecainide, Dr. Geraldo Pitter is going to have her get re-established with EP, appointment with Curt Bears, Will, MD is scheduled for 08/26/20 at the [place] location.   07/15/2020 presurgical COVID-19 test negative.  Anesthesia team to evaluate on the day of surgery.   VS: BP 119/69   Pulse 71   Temp 36.9 C (Oral)   Resp 18   Ht 5\' 3"  (1.6 m)   Wt 71.4 kg   SpO2 97%   BMI 27.89 kg/m   PROVIDERS: [name], MD is PCP [name], MD is cardiologist   LABS: Labs reviewed: Acceptable for surgery. (all labs ordered are listed, but only abnormal results are displayed)  Labs Reviewed  BASIC METABOLIC PANEL - Abnormal; Notable for the following components:      Result Value   Creatinine, Ser 1.11 (*)    GFR, Estimated 52 (*)    All other components within normal limits  SURGICAL PCR SCREEN  SARS CORONAVIRUS 2 (TAT 6-24 HRS)  CBC     IMAGES: MRI C-spine 07/16/20: Imaging can be viewed in [facility]/PACS  MRI L-spine 06/19/20   (RH, result in [facility]/PACS): IMPRESSION: - New large disc bulge and left side protrusion with caudal extension at T10-11 cause moderately severe central canal stenosis, moderately severe to severe right greater than left foraminal narrowing and narrowing in the left lateral recess with impingement on the descending T11 root as described above. - Moderate central canal stenosis at L1-2 appears somewhat worse than on the prior examination. Severe narrowing in the right foramen and subarticular recess appear unchanged. - Transitional lumbosacral anatomy. Numbering scheme is the same as that used on prior studies. Correlation with plain films is recommended any intervention is planned. - The appearance of the lumbar spine is otherwise not notably changed. See above for descriptions of individual levels.   EKG: 05/30/20: NSR   CV: Echo 06/19/20: IMPRESSIONS  1. Left ventricular ejection fraction, by estimation, is 60 to 65%. The  left ventricle has normal function. The left ventricle has no regional  wall motion abnormalities. There is mild left ventricular hypertrophy.  Left ventricular diastolic parameters  are consistent with Grade I diastolic dysfunction (impaired relaxation).  2. Right ventricular systolic function is normal. The right ventricular  size is normal. There is normal pulmonary artery systolic pressure.  3. The mitral valve is normal in structure. No evidence of mitral valve  regurgitation. No evidence of mitral stenosis.  4. The aortic valve is normal in structure. Aortic valve regurgitation is  not visualized. No aortic stenosis  is present.  5. The inferior vena cava is normal in size with greater than 50%  respiratory variability, suggesting right atrial pressure of 3 mmHg.    Nuclear stress test 12/05/19:  The left ventricular ejection fraction is normal (55-65%).  Nuclear stress EF: 64%.  There was no ST segment deviation noted during stress.  No T  wave inversion was noted during stress.  The study is normal.  This is a low risk study.    Past Medical History:  Diagnosis Date  . Arthritis   . Cardiac arrhythmia 03/28/2020  . Cervical myelopathy ([place]) 11/05/2016  . Chronic pain of left knee 11/17/2018  . Depression   . Dysrhythmia   . GERD (gastroesophageal reflux disease)   . History of total knee arthroplasty 12/26/2019  . Hyperlipidemia 11/05/2016  . Hypothyroidism   . Myelopathy of cervical spinal cord with cervical radiculopathy ([place]) 11/05/2016  . Preoperative cardiovascular examination 11/21/2019  . PVC (premature ventricular contraction) 11/05/2016  . PVC's (premature ventricular contractions)   . S/P total knee arthroplasty, left 12/26/2019  . Skin cancer of face   . Ventricular premature beats 11/05/2016  . Wolff-Parkinson-White (WPW) pattern 11/05/2016  . Wolff-Parkinson-White (WPW) syndrome   . WPW (Wolff-Parkinson-White syndrome) 11/05/2016    Past Surgical History:  Procedure Laterality Date  . ANTERIOR CERVICAL DECOMP/DISCECTOMY FUSION N/A 11/05/2016   Procedure: ANTERIOR CERVICAL DECOMPRESSION/DISCECTOMY FUSION  - CERVICAL THREE-FOUR, CERVICAL FIVE-SIX, CERVICAL SIX -SEVEN;  Surgeon: Kary Kos, MD;  Location: [facility];  Service: Neurosurgery;  Laterality: N/A;  . COLONOSCOPY    . EYE SURGERY     both eyes lens implants  . SKIN CANCER EXCISION     Face  . TONSILLECTOMY    . TOTAL KNEE ARTHROPLASTY Left 12/26/2019   Procedure: TOTAL KNEE ARTHROPLASTY;  Surgeon: Paralee Cancel, MD;  Location: [facility];  Service: Orthopedics;  Laterality: Left;  70 mins  . TUBAL LIGATION      MEDICATIONS: . atorvastatin (LIPITOR) 20 MG tablet  . calcium carbonate (OS-CAL) 600 MG TABS tablet  . cyclobenzaprine (FLEXERIL) 10 MG tablet  . flecainide (TAMBOCOR) 100 MG tablet  . fluconazole (DIFLUCAN) 200 MG tablet  . levothyroxine (SYNTHROID) 100 MCG tablet  . Multiple Vitamin (MULTIVITAMIN WITH MINERALS) TABS tablet  . omeprazole  (PRILOSEC) 40 MG capsule  . polyethylene glycol (MIRALAX / GLYCOLAX) 17 g packet  . rOPINIRole (REQUIP) 1 MG tablet  . sertraline (ZOLOFT) 100 MG tablet  . traZODone (DESYREL) 100 MG tablet   No current facility-administered medications for this encounter.    Myra Gianotti, PA-C Surgical Short Stay/Anesthesiology [facility] Phone [phone] [facility] Phone [phone] 07/16/2020 10:27 AM

## 2020-07-16 NOTE — Anesthesia Preprocedure Evaluation (Addendum)
Anesthesia Evaluation  Patient identified by MRN, date of birth, ID band Patient awake    Reviewed: Allergy & Precautions, H&P , NPO status , Patient's Chart, lab work & pertinent test results  Airway Mallampati: II   Neck ROM: full    Dental   Pulmonary former smoker,    breath sounds clear to auscultation       Cardiovascular + dysrhythmias  Rhythm:regular Rate:Normal  WPW   Neuro/Psych PSYCHIATRIC DISORDERS Depression  Neuromuscular disease    GI/Hepatic GERD  ,  Endo/Other  Hypothyroidism   Renal/GU      Musculoskeletal  (+) Arthritis ,   Abdominal   Peds  Hematology   Anesthesia Other Findings   Reproductive/Obstetrics                            Anesthesia Physical Anesthesia Plan  ASA: II  Anesthesia Plan: General   Post-op Pain Management:    Induction: Intravenous  PONV Risk Score and Plan: 3 and Ondansetron, Dexamethasone and Treatment may vary due to age or medical condition  Airway Management Planned: Oral ETT  Additional Equipment:   Intra-op Plan:   Post-operative Plan: Extubation in OR  Informed Consent: I have reviewed the patients History and Physical, chart, labs and discussed the procedure including the risks, benefits and alternatives for the proposed anesthesia with the patient or authorized representative who has indicated his/her understanding and acceptance.     Dental advisory given  Plan Discussed with: CRNA, Anesthesiologist and Surgeon  Anesthesia Plan Comments: (PAT note written 07/16/2020 by Myra Gianotti, PA-C. )       Anesthesia Quick Evaluation

## 2020-07-17 ENCOUNTER — Encounter (HOSPITAL_COMMUNITY): Payer: Self-pay | Admitting: Neurosurgery

## 2020-07-17 ENCOUNTER — Ambulatory Visit (HOSPITAL_COMMUNITY): Payer: Medicare HMO

## 2020-07-17 ENCOUNTER — Ambulatory Visit (HOSPITAL_COMMUNITY): Payer: Medicare HMO | Admitting: Vascular Surgery

## 2020-07-17 ENCOUNTER — Ambulatory Visit (HOSPITAL_COMMUNITY)
Admission: RE | Admit: 2020-07-17 | Discharge: 2020-07-18 | Disposition: A | Discharge status: Home / Self Care | Payer: Medicare HMO | Attending: Neurosurgery | Admitting: Neurosurgery

## 2020-07-17 ENCOUNTER — Other Ambulatory Visit: Payer: Self-pay

## 2020-07-17 ENCOUNTER — Ambulatory Visit (HOSPITAL_COMMUNITY): Payer: Medicare HMO | Admitting: Certified Registered Nurse Anesthetist

## 2020-07-17 ENCOUNTER — Encounter (HOSPITAL_COMMUNITY)
Admission: RE | Disposition: A | Discharge status: Home / Self Care | Payer: Self-pay | Source: Home / Self Care | Attending: Neurosurgery

## 2020-07-17 DIAGNOSIS — Z87891 Personal history of nicotine dependence: Secondary | ICD-10-CM | POA: Insufficient documentation

## 2020-07-17 DIAGNOSIS — M4800 Spinal stenosis, site unspecified: Secondary | ICD-10-CM

## 2020-07-17 DIAGNOSIS — M4804 Spinal stenosis, thoracic region: Secondary | ICD-10-CM | POA: Diagnosis not present

## 2020-07-17 DIAGNOSIS — Z85828 Personal history of other malignant neoplasm of skin: Secondary | ICD-10-CM | POA: Diagnosis not present

## 2020-07-17 DIAGNOSIS — M5416 Radiculopathy, lumbar region: Secondary | ICD-10-CM | POA: Diagnosis not present

## 2020-07-17 DIAGNOSIS — Z419 Encounter for procedure for purposes other than remedying health state, unspecified: Secondary | ICD-10-CM

## 2020-07-17 DIAGNOSIS — M48061 Spinal stenosis, lumbar region without neurogenic claudication: Secondary | ICD-10-CM | POA: Diagnosis not present

## 2020-07-17 DIAGNOSIS — Z79890 Hormone replacement therapy (postmenopausal): Secondary | ICD-10-CM | POA: Insufficient documentation

## 2020-07-17 DIAGNOSIS — Z96652 Presence of left artificial knee joint: Secondary | ICD-10-CM | POA: Diagnosis not present

## 2020-07-17 DIAGNOSIS — Z882 Allergy status to sulfonamides status: Secondary | ICD-10-CM | POA: Insufficient documentation

## 2020-07-17 DIAGNOSIS — M5414 Radiculopathy, thoracic region: Secondary | ICD-10-CM | POA: Insufficient documentation

## 2020-07-17 DIAGNOSIS — Z981 Arthrodesis status: Secondary | ICD-10-CM | POA: Diagnosis not present

## 2020-07-17 DIAGNOSIS — Z888 Allergy status to other drugs, medicaments and biological substances status: Secondary | ICD-10-CM | POA: Insufficient documentation

## 2020-07-17 DIAGNOSIS — G992 Myelopathy in diseases classified elsewhere: Secondary | ICD-10-CM | POA: Insufficient documentation

## 2020-07-17 DIAGNOSIS — Z79899 Other long term (current) drug therapy: Secondary | ICD-10-CM | POA: Diagnosis not present

## 2020-07-17 DIAGNOSIS — G959 Disease of spinal cord, unspecified: Secondary | ICD-10-CM | POA: Diagnosis not present

## 2020-07-17 DIAGNOSIS — E039 Hypothyroidism, unspecified: Secondary | ICD-10-CM | POA: Diagnosis not present

## 2020-07-17 DIAGNOSIS — Z91030 Bee allergy status: Secondary | ICD-10-CM | POA: Insufficient documentation

## 2020-07-17 DIAGNOSIS — K219 Gastro-esophageal reflux disease without esophagitis: Secondary | ICD-10-CM | POA: Diagnosis not present

## 2020-07-17 HISTORY — PX: LUMBAR LAMINECTOMY/DECOMPRESSION MICRODISCECTOMY: SHX5026

## 2020-07-17 HISTORY — DX: Spinal stenosis, site unspecified: M48.00

## 2020-07-17 SURGERY — LUMBAR LAMINECTOMY/DECOMPRESSION MICRODISCECTOMY 2 LEVELS
Anesthesia: General | Site: Back | Laterality: Bilateral

## 2020-07-17 MED ORDER — LIDOCAINE-EPINEPHRINE 1 %-1:100000 IJ SOLN
INTRAMUSCULAR | Status: DC | PRN
  Administered 2020-07-17: 10 mL

## 2020-07-17 MED ORDER — MIDAZOLAM HCL 2 MG/2ML IJ SOLN
INTRAMUSCULAR | Status: DC | PRN
  Administered 2020-07-17: 2 mg via INTRAVENOUS

## 2020-07-17 MED ORDER — CEFAZOLIN SODIUM-DEXTROSE 2-4 GM/100ML-% IV SOLN
2.0000 g | INTRAVENOUS | Status: AC
  Administered 2020-07-17: 300 g via INTRAVENOUS
  Administered 2020-07-17: 2 g via INTRAVENOUS
  Filled 2020-07-17: qty 100

## 2020-07-17 MED ORDER — SERTRALINE HCL 50 MG PO TABS
200.0000 mg | ORAL_TABLET | Freq: Every day | ORAL | Status: DC
  Administered 2020-07-18: 200 mg via ORAL
  Filled 2020-07-17: qty 4

## 2020-07-17 MED ORDER — SODIUM CHLORIDE 0.9% FLUSH: 3.0000 mL | INTRAVENOUS | Status: DC | PRN

## 2020-07-17 MED ORDER — ORAL CARE MOUTH RINSE: 15.0000 mL | Freq: Once | OROMUCOSAL | Status: AC

## 2020-07-17 MED ORDER — PHENYLEPHRINE HCL-NACL 10-0.9 MG/250ML-% IV SOLN
INTRAVENOUS | Status: DC | PRN
  Administered 2020-07-17: 25 ug/min via INTRAVENOUS

## 2020-07-17 MED ORDER — DEXAMETHASONE SODIUM PHOSPHATE 10 MG/ML IJ SOLN
10.0000 mg | Freq: Once | INTRAMUSCULAR | Status: DC
  Filled 2020-07-17: qty 1

## 2020-07-17 MED ORDER — CYCLOBENZAPRINE HCL 10 MG PO TABS: 10.0000 mg | ORAL_TABLET | Freq: Three times a day (TID) | ORAL | Status: DC | PRN

## 2020-07-17 MED ORDER — SODIUM CHLORIDE 0.9% FLUSH
3.0000 mL | Freq: Two times a day (BID) | INTRAVENOUS | Status: DC
  Administered 2020-07-17 (×2): 3 mL via INTRAVENOUS

## 2020-07-17 MED ORDER — FENTANYL CITRATE (PF) 250 MCG/5ML IJ SOLN
INTRAMUSCULAR | Status: AC
  Filled 2020-07-17: qty 5

## 2020-07-17 MED ORDER — ROCURONIUM BROMIDE 10 MG/ML (PF) SYRINGE
PREFILLED_SYRINGE | INTRAVENOUS | Status: AC
  Filled 2020-07-17: qty 10

## 2020-07-17 MED ORDER — ONDANSETRON HCL 4 MG/2ML IJ SOLN: 4.0000 mg | Freq: Four times a day (QID) | INTRAMUSCULAR | Status: DC | PRN

## 2020-07-17 MED ORDER — CALCIUM CARBONATE 1250 (500 CA) MG PO TABS
1250.0000 mg | ORAL_TABLET | Freq: Two times a day (BID) | ORAL | Status: DC
  Administered 2020-07-17 – 2020-07-18 (×2): 1250 mg via ORAL
  Filled 2020-07-17 (×3): qty 1

## 2020-07-17 MED ORDER — MENTHOL 3 MG MT LOZG: 1.0000 | LOZENGE | OROMUCOSAL | Status: DC | PRN

## 2020-07-17 MED ORDER — ROCURONIUM BROMIDE 10 MG/ML (PF) SYRINGE
PREFILLED_SYRINGE | INTRAVENOUS | Status: DC | PRN
  Administered 2020-07-17: 50 mg via INTRAVENOUS

## 2020-07-17 MED ORDER — ALBUMIN HUMAN 5 % IV SOLN: INTRAVENOUS | Status: DC | PRN

## 2020-07-17 MED ORDER — PROPOFOL 10 MG/ML IV BOLUS
INTRAVENOUS | Status: DC | PRN
  Administered 2020-07-17: 130 mg via INTRAVENOUS

## 2020-07-17 MED ORDER — POLYETHYLENE GLYCOL 3350 17 G PO PACK: 17.0000 g | PACK | ORAL | Status: DC | PRN

## 2020-07-17 MED ORDER — THROMBIN 5000 UNITS EX SOLR
CUTANEOUS | Status: AC
  Filled 2020-07-17: qty 5000

## 2020-07-17 MED ORDER — ACETAMINOPHEN 325 MG PO TABS
650.0000 mg | ORAL_TABLET | ORAL | Status: DC | PRN
  Administered 2020-07-18: 650 mg via ORAL
  Filled 2020-07-17: qty 2

## 2020-07-17 MED ORDER — PANTOPRAZOLE SODIUM 40 MG PO TBEC
40.0000 mg | DELAYED_RELEASE_TABLET | Freq: Every day | ORAL | Status: DC
  Administered 2020-07-18: 40 mg via ORAL
  Filled 2020-07-17: qty 1

## 2020-07-17 MED ORDER — LACTATED RINGERS IV SOLN: INTRAVENOUS | Status: DC | PRN

## 2020-07-17 MED ORDER — LIDOCAINE 2% (20 MG/ML) 5 ML SYRINGE
INTRAMUSCULAR | Status: AC
  Filled 2020-07-17: qty 5

## 2020-07-17 MED ORDER — THROMBIN 5000 UNITS EX SOLR
OROMUCOSAL | Status: DC | PRN
  Administered 2020-07-17: 5 mL via TOPICAL

## 2020-07-17 MED ORDER — SUGAMMADEX SODIUM 200 MG/2ML IV SOLN
INTRAVENOUS | Status: DC | PRN
  Administered 2020-07-17: 200 mg via INTRAVENOUS

## 2020-07-17 MED ORDER — SODIUM CHLORIDE 0.9 % IV SOLN
250.0000 mL | INTRAVENOUS | Status: DC
  Administered 2020-07-17: 250 mL via INTRAVENOUS

## 2020-07-17 MED ORDER — ONDANSETRON HCL 4 MG PO TABS: 4.0000 mg | ORAL_TABLET | Freq: Four times a day (QID) | ORAL | Status: DC | PRN

## 2020-07-17 MED ORDER — DEXAMETHASONE SODIUM PHOSPHATE 10 MG/ML IJ SOLN
INTRAMUSCULAR | Status: DC | PRN
  Administered 2020-07-17: 10 mg via INTRAVENOUS

## 2020-07-17 MED ORDER — PHENOL 1.4 % MT LIQD: 1.0000 | OROMUCOSAL | Status: DC | PRN

## 2020-07-17 MED ORDER — CHLORHEXIDINE GLUCONATE CLOTH 2 % EX PADS: 6.0000 | MEDICATED_PAD | Freq: Once | CUTANEOUS | Status: DC

## 2020-07-17 MED ORDER — THROMBIN 5000 UNITS EX SOLR
CUTANEOUS | Status: AC
  Filled 2020-07-17: qty 10000

## 2020-07-17 MED ORDER — HEMOSTATIC AGENTS (NO CHARGE) OPTIME
TOPICAL | Status: DC | PRN
  Administered 2020-07-17: 1 via TOPICAL

## 2020-07-17 MED ORDER — LIDOCAINE 2% (20 MG/ML) 5 ML SYRINGE
INTRAMUSCULAR | Status: DC | PRN
  Administered 2020-07-17: 60 mg via INTRAVENOUS

## 2020-07-17 MED ORDER — 0.9 % SODIUM CHLORIDE (POUR BTL) OPTIME
TOPICAL | Status: DC | PRN
  Administered 2020-07-17: 1000 mL

## 2020-07-17 MED ORDER — LACTATED RINGERS IV SOLN: INTRAVENOUS | Status: DC

## 2020-07-17 MED ORDER — DEXAMETHASONE SODIUM PHOSPHATE 10 MG/ML IJ SOLN
INTRAMUSCULAR | Status: AC
  Filled 2020-07-17: qty 1

## 2020-07-17 MED ORDER — FLECAINIDE ACETATE 100 MG PO TABS
100.0000 mg | ORAL_TABLET | Freq: Two times a day (BID) | ORAL | Status: DC
  Administered 2020-07-17 – 2020-07-18 (×2): 100 mg via ORAL
  Filled 2020-07-17 (×3): qty 1

## 2020-07-17 MED ORDER — PROPOFOL 10 MG/ML IV BOLUS
INTRAVENOUS | Status: AC
  Filled 2020-07-17: qty 20

## 2020-07-17 MED ORDER — ADULT MULTIVITAMIN W/MINERALS CH
1.0000 | ORAL_TABLET | Freq: Every day | ORAL | Status: DC
  Administered 2020-07-17 – 2020-07-18 (×2): 1 via ORAL
  Filled 2020-07-17 (×2): qty 1

## 2020-07-17 MED ORDER — OXYCODONE HCL 5 MG PO TABS: 5.0000 mg | ORAL_TABLET | Freq: Once | ORAL | Status: DC | PRN

## 2020-07-17 MED ORDER — EPHEDRINE SULFATE-NACL 50-0.9 MG/10ML-% IV SOSY
PREFILLED_SYRINGE | INTRAVENOUS | Status: DC | PRN
  Administered 2020-07-17: 10 mg via INTRAVENOUS
  Administered 2020-07-17 (×2): 15 mg via INTRAVENOUS

## 2020-07-17 MED ORDER — BUPIVACAINE HCL (PF) 0.25 % IJ SOLN
INTRAMUSCULAR | Status: AC
  Filled 2020-07-17: qty 30

## 2020-07-17 MED ORDER — PHENYLEPHRINE 40 MCG/ML (10ML) SYRINGE FOR IV PUSH (FOR BLOOD PRESSURE SUPPORT)
PREFILLED_SYRINGE | INTRAVENOUS | Status: AC
  Filled 2020-07-17: qty 10

## 2020-07-17 MED ORDER — FLUCONAZOLE 200 MG PO TABS: 200.0000 mg | ORAL_TABLET | ORAL | Status: DC

## 2020-07-17 MED ORDER — FENTANYL CITRATE (PF) 100 MCG/2ML IJ SOLN
INTRAMUSCULAR | Status: AC
  Filled 2020-07-17: qty 2

## 2020-07-17 MED ORDER — CYCLOBENZAPRINE HCL 10 MG PO TABS
10.0000 mg | ORAL_TABLET | Freq: Three times a day (TID) | ORAL | Status: DC | PRN
  Administered 2020-07-17: 10 mg via ORAL
  Filled 2020-07-17: qty 1

## 2020-07-17 MED ORDER — WHITE PETROLATUM EX OINT
TOPICAL_OINTMENT | CUTANEOUS | Status: AC
  Filled 2020-07-17: qty 28.35

## 2020-07-17 MED ORDER — FENTANYL CITRATE (PF) 250 MCG/5ML IJ SOLN
INTRAMUSCULAR | Status: DC | PRN
  Administered 2020-07-17 (×2): 50 ug via INTRAVENOUS

## 2020-07-17 MED ORDER — THROMBIN 5000 UNITS EX SOLR
CUTANEOUS | Status: DC | PRN
  Administered 2020-07-17 (×2): 5000 [IU] via TOPICAL

## 2020-07-17 MED ORDER — ONDANSETRON HCL 4 MG/2ML IJ SOLN
INTRAMUSCULAR | Status: DC | PRN
  Administered 2020-07-17: 4 mg via INTRAVENOUS

## 2020-07-17 MED ORDER — OXYCODONE HCL 5 MG/5ML PO SOLN: 5.0000 mg | Freq: Once | ORAL | Status: DC | PRN

## 2020-07-17 MED ORDER — EPHEDRINE 5 MG/ML INJ
INTRAVENOUS | Status: AC
  Filled 2020-07-17: qty 10

## 2020-07-17 MED ORDER — ALUM & MAG HYDROXIDE-SIMETH 200-200-20 MG/5ML PO SUSP: 30.0000 mL | Freq: Four times a day (QID) | ORAL | Status: DC | PRN

## 2020-07-17 MED ORDER — CEFAZOLIN SODIUM-DEXTROSE 2-4 GM/100ML-% IV SOLN
2.0000 g | Freq: Three times a day (TID) | INTRAVENOUS | Status: AC
  Administered 2020-07-17 – 2020-07-18 (×2): 2 g via INTRAVENOUS
  Filled 2020-07-17 (×2): qty 100

## 2020-07-17 MED ORDER — TRAZODONE HCL 100 MG PO TABS
100.0000 mg | ORAL_TABLET | Freq: Every day | ORAL | Status: DC
  Administered 2020-07-17: 100 mg via ORAL
  Filled 2020-07-17 (×2): qty 1

## 2020-07-17 MED ORDER — CHLORHEXIDINE GLUCONATE 0.12 % MT SOLN
15.0000 mL | Freq: Once | OROMUCOSAL | Status: AC
  Administered 2020-07-17: 15 mL via OROMUCOSAL
  Filled 2020-07-17: qty 15

## 2020-07-17 MED ORDER — LEVOTHYROXINE SODIUM 100 MCG PO TABS
100.0000 ug | ORAL_TABLET | Freq: Every day | ORAL | Status: DC
  Administered 2020-07-18: 100 ug via ORAL
  Filled 2020-07-17: qty 1

## 2020-07-17 MED ORDER — FENTANYL CITRATE (PF) 100 MCG/2ML IJ SOLN
25.0000 ug | INTRAMUSCULAR | Status: DC | PRN
  Administered 2020-07-17: 25 ug via INTRAVENOUS

## 2020-07-17 MED ORDER — OXYCODONE HCL 5 MG PO TABS
10.0000 mg | ORAL_TABLET | ORAL | Status: DC | PRN
Start: 2020-07-17 — End: 2020-07-18
  Administered 2020-07-17 – 2020-07-18 (×6): 10 mg via ORAL
  Filled 2020-07-17 (×6): qty 2

## 2020-07-17 MED ORDER — LIDOCAINE-EPINEPHRINE 1 %-1:100000 IJ SOLN
INTRAMUSCULAR | Status: AC
  Filled 2020-07-17: qty 1

## 2020-07-17 MED ORDER — MIDAZOLAM HCL 2 MG/2ML IJ SOLN
INTRAMUSCULAR | Status: AC
  Filled 2020-07-17: qty 2

## 2020-07-17 MED ORDER — ROPINIROLE HCL 1 MG PO TABS
5.0000 mg | ORAL_TABLET | Freq: Every day | ORAL | Status: DC
  Administered 2020-07-17: 5 mg via ORAL
  Filled 2020-07-17: qty 5

## 2020-07-17 MED ORDER — ONDANSETRON HCL 4 MG/2ML IJ SOLN
INTRAMUSCULAR | Status: AC
  Filled 2020-07-17: qty 2

## 2020-07-17 MED ORDER — PANTOPRAZOLE SODIUM 40 MG IV SOLR: 40.0000 mg | Freq: Every day | INTRAVENOUS | Status: DC

## 2020-07-17 MED ORDER — ACETAMINOPHEN 650 MG RE SUPP: 650.0000 mg | RECTAL | Status: DC | PRN

## 2020-07-17 MED ORDER — HYDROMORPHONE HCL 1 MG/ML IJ SOLN
0.5000 mg | INTRAMUSCULAR | Status: DC | PRN
Start: 2020-07-17 — End: 2020-07-18

## 2020-07-17 MED ORDER — ATORVASTATIN CALCIUM 10 MG PO TABS: 20.0000 mg | ORAL_TABLET | Freq: Every day | ORAL | Status: DC

## 2020-07-17 SURGICAL SUPPLY — 52 items
BAND RUBBER #18 3X1/16 STRL (MISCELLANEOUS) ×4 IMPLANT
BENZOIN TINCTURE PRP APPL 2/3 (GAUZE/BANDAGES/DRESSINGS) ×2 IMPLANT
BLADE CLIPPER SURG (BLADE) IMPLANT
BLADE SURG 11 STRL SS (BLADE) ×2 IMPLANT
BUR CUTTER 7.0 ROUND (BURR) ×2 IMPLANT
BUR MATCHSTICK NEURO 3.0 LAGG (BURR) ×2 IMPLANT
CANISTER SUCT 3000ML PPV (MISCELLANEOUS) ×2 IMPLANT
CARTRIDGE OIL MAESTRO DRILL (MISCELLANEOUS) ×1 IMPLANT
COVER WAND RF STERILE (DRAPES) ×2 IMPLANT
DECANTER SPIKE VIAL GLASS SM (MISCELLANEOUS) ×2 IMPLANT
DERMABOND ADVANCED (GAUZE/BANDAGES/DRESSINGS) ×1
DERMABOND ADVANCED .7 DNX12 (GAUZE/BANDAGES/DRESSINGS) ×1 IMPLANT
DIFFUSER DRILL AIR PNEUMATIC (MISCELLANEOUS) ×2 IMPLANT
DRAPE HALF SHEET 40X57 (DRAPES) IMPLANT
DRAPE LAPAROTOMY 100X72X124 (DRAPES) ×2 IMPLANT
DRAPE MICROSCOPE LEICA (MISCELLANEOUS) ×2 IMPLANT
DRAPE SURG 17X23 STRL (DRAPES) ×2 IMPLANT
DRSG OPSITE POSTOP 4X8 (GAUZE/BANDAGES/DRESSINGS) ×2 IMPLANT
DURAPREP 26ML APPLICATOR (WOUND CARE) ×2 IMPLANT
ELECT REM PT RETURN 9FT ADLT (ELECTROSURGICAL) ×2
ELECTRODE REM PT RTRN 9FT ADLT (ELECTROSURGICAL) ×1 IMPLANT
GAUZE 4X4 16PLY RFD (DISPOSABLE) IMPLANT
GAUZE SPONGE 4X4 12PLY STRL (GAUZE/BANDAGES/DRESSINGS) ×2 IMPLANT
GLOVE BIO SURGEON STRL SZ7 (GLOVE) ×2 IMPLANT
GLOVE BIO SURGEON STRL SZ8 (GLOVE) ×2 IMPLANT
GLOVE EXAM NITRILE XL STR (GLOVE) IMPLANT
GLOVE INDICATOR 8.5 STRL (GLOVE) ×2 IMPLANT
GLOVE SURG UNDER POLY LF SZ6.5 (GLOVE) ×6 IMPLANT
GLOVE SURG UNDER POLY LF SZ7 (GLOVE) IMPLANT
GLOVE SURG UNDER POLY LF SZ7.5 (GLOVE) ×2 IMPLANT
GOWN STRL REUS W/ TWL LRG LVL3 (GOWN DISPOSABLE) ×2 IMPLANT
GOWN STRL REUS W/ TWL XL LVL3 (GOWN DISPOSABLE) ×2 IMPLANT
GOWN STRL REUS W/TWL 2XL LVL3 (GOWN DISPOSABLE) IMPLANT
GOWN STRL REUS W/TWL LRG LVL3 (GOWN DISPOSABLE) ×2
GOWN STRL REUS W/TWL XL LVL3 (GOWN DISPOSABLE) ×2
HEMOSTAT POWDER SURGIFOAM 1G (HEMOSTASIS) ×2 IMPLANT
KIT BASIN OR (CUSTOM PROCEDURE TRAY) ×2 IMPLANT
KIT TURNOVER KIT B (KITS) ×2 IMPLANT
NEEDLE HYPO 22GX1.5 SAFETY (NEEDLE) ×2 IMPLANT
NEEDLE SPNL 22GX3.5 QUINCKE BK (NEEDLE) ×2 IMPLANT
NS IRRIG 1000ML POUR BTL (IV SOLUTION) ×2 IMPLANT
OIL CARTRIDGE MAESTRO DRILL (MISCELLANEOUS) ×2
PACK LAMINECTOMY NEURO (CUSTOM PROCEDURE TRAY) ×2 IMPLANT
SPONGE SURGIFOAM ABS GEL SZ50 (HEMOSTASIS) ×2 IMPLANT
STRIP CLOSURE SKIN 1/2X4 (GAUZE/BANDAGES/DRESSINGS) ×2 IMPLANT
SUT VIC AB 0 CT1 18XCR BRD8 (SUTURE) ×1 IMPLANT
SUT VIC AB 0 CT1 8-18 (SUTURE) ×1
SUT VIC AB 2-0 CT1 18 (SUTURE) ×4 IMPLANT
SUT VICRYL 4-0 PS2 18IN ABS (SUTURE) ×4 IMPLANT
TOWEL GREEN STERILE (TOWEL DISPOSABLE) ×2 IMPLANT
TOWEL GREEN STERILE FF (TOWEL DISPOSABLE) ×2 IMPLANT
WATER STERILE IRR 1000ML POUR (IV SOLUTION) ×2 IMPLANT

## 2020-07-17 NOTE — Transfer of Care (Signed)
Immediate Anesthesia Transfer of Care Note  Patient: Laura Singleton  Procedure(s) Performed: Laminectomy and Foraminotomy - bilateral - Thoracic ten-Thoracic eleven - Lumbar one-Lumbar two (Bilateral Back)  Patient Location: PACU  Anesthesia Type:General  Level of Consciousness: drowsy and patient cooperative  Airway & Oxygen Therapy: Patient Spontanous Breathing and Patient connected to face mask oxygen  Post-op Assessment: Report given to RN and Post -op Vital signs reviewed and stable  Post vital signs: Reviewed and stable  Last Vitals:  Vitals Value Taken Time  BP 86/59 07/17/20 1056  Temp    Pulse 75 07/17/20 1059  Resp    SpO2 96 % 07/17/20 1059  Vitals shown include unvalidated device data.  Last Pain:  Vitals:   07/17/20 0714  TempSrc:   PainSc: 5       Patients Stated Pain Goal: 2 (74/25/95 6387)  Complications: No complications documented.

## 2020-07-17 NOTE — Op Note (Signed)
Preoperative diagnosis: Thoracic myelopathy from severe stenosis T10-11 as well as severe stenosis L1-L2 with bilateral L2 radiculopathies  Postoperative diagnosis: Same  Procedure: #1 decompressive thoracic laminectomy T10-11 with partial medial facetectomies and microscopic examination of the disc space with microscopic foraminotomies of the T10 and T11 nerve roots  2.  Through a separate skin incision decompressive laminectomy L1-L2 partial medial facetectomies and foraminotomies of the L1 and L2 nerve roots  Surgeon: Dominica Severin Anely Spiewak  System: Duffy Rhody  Anesthesia: General  EBL: Minimal  HPI: 75 year old female with progressive worsening back pain bilateral hip and leg pain numbness in her legs difficulty walking.  Work-up revealed severe cord compression from spinal stenosis T10-11 with central disc herniation as well as severe stenosis at L1-L2.  Due to patient's progression of clinical syndrome imaging findings and failed conservative treatment I recommended decompressive laminectomies at T10-11 as well as the L1-L2 with inspection of the disc base of both levels.  I extensively went over the risks and benefits of the operation with her as well as perioperative course expectations of outcome and alternatives of surgery and she understood and agreed to proceed forward.  Operative procedure: Patient was brought into the OR was [place] general anesthesia positioned prone on the Wilson frame.  AP C arm helped identify the T11 pedicle as well as the L2 pedicle and these were marked then the back was shaved prepped and draped in routine sterile fashion.  2 incisions were drawn out centering over the preoperative localization film.  Then both incisions were made after infiltration of 10 cc lidocaine with epi and subperiosteal dissection was carried lamina of T10-11 as well as L1-L2 bilaterally.  Intraoperative x-ray confirmed identification appropriate level.  So then the spinous process of T10 and  the superior aspect the spinous process of T11 was all removed the lamina of T10 was drilled down extensively began the decompression by marching up the gutters with a 2 mm Kerrison punch and remove the central lamina en bloc then marching underneath the medial gutter partial medial facetectomies were performed.  This was done bilaterally.  I then identified the T11 pedicle and under microscope illumination drilled off the superior aspect and medial aspect of the T11 pedicle to identify the T10-11 disc base and then explored the disc base and the area inferiorly.  I did not find a compressive disc it was still partially contained with the ligament I did feel soft disc underneath the ligament is not causing cord compression.  Because I did not feel like this was significant contributing to her cord compression I left it alone it already decompressed her posteriorly.  In order to get the disc out centrally I would have had destabilizing more by drilling more into the pedicle and facet joint.  So I confirmed adequate decompression under microscopic lamination.  I then remove the microscope packed this with Gelfoam and a sponge and attention was then taken back to the L1-L2 incision.  I remove the L1 spinous process superior aspect the L2 spinous process the entire L1 lamina was drilled down central decompression was begun I extended the decompression up to the top of the L1 pedicle there was some epidural fat in that location confirming superior extent of the decompression then marching laterally with severe stenosis at the L1-2 interspace this was all removed en bloc centrally decompressing the spinal cord then marching along the lateral gutter and medial gutter I performed foraminotomies of the L1 and L2 nerve roots easily passing a nerve  hook and coronary dilator out the foramina.  I inspected the disc base it was not felt to be significantly compressive so I left this alone.  This was then also copiously given to  [place] states was maintained Gelfoam was overlaid top of the dura both wounds were then reinspected to confirm patency and hemostasis and both wounds were closed with interrupted Vicryl running 4-0 subcuticular's.  Dermabond benzoin Steri-Strips and sterile dressing was applied patient recovery in stable condition.  At the end the case all needle count sponge counts were correct.

## 2020-07-17 NOTE — Anesthesia Procedure Notes (Signed)
Procedure Name: Intubation Date/Time: 07/17/2020 8:58 AM Performed by: Kathryne Hitch, CRNA Pre-anesthesia Checklist: Patient identified, Emergency Drugs available, Suction available and Patient being monitored Patient Re-evaluated:Patient Re-evaluated prior to induction Oxygen Delivery Method: Circle system utilized Preoxygenation: Pre-oxygenation with 100% oxygen Induction Type: IV induction Ventilation: Mask ventilation without difficulty Laryngoscope Size: Miller and 3 Grade View: Grade II Tube type: Oral Tube size: 7.0 mm Number of attempts: 1 Airway Equipment and Method: Stylet and Oral airway Placement Confirmation: ETT inserted through vocal cords under direct vision,  positive ETCO2 and breath sounds checked- equal and bilateral Secured at: 21 cm Tube secured with: Tape Dental Injury: Teeth and Oropharynx as per pre-operative assessment

## 2020-07-17 NOTE — H&P (Signed)
Laura Singleton is an 75 y.o. female.   Chief Complaint: Back bilateral hip and leg pain HPI: 75 year old with severe back pain bilateral hip and leg pain numbness tingling weakness in her legs and claudication.  Work-up revealed severe spinal stenosis cord compression at T10-11 as well as L1-L2.  Due to patient's progression of clinical syndrome imaging findings and failed conservative treatment I recommended decompressive laminectomies at T10-11 and L1-L2 with possible discectomies at both levels.  I extensively reviewed the risks and benefits of the operation with her as well as perioperative course expectations of outcome and alternatives of surgery and she understands and agrees to proceed forward.  Past Medical History:  Diagnosis Date  . Arthritis   . Cardiac arrhythmia 03/28/2020  . Cervical myelopathy ([place]) 11/05/2016  . Chronic pain of left knee 11/17/2018  . Depression   . Dysrhythmia   . GERD (gastroesophageal reflux disease)   . History of total knee arthroplasty 12/26/2019  . Hyperlipidemia 11/05/2016  . Hypothyroidism   . Myelopathy of cervical spinal cord with cervical radiculopathy ([place]) 11/05/2016  . Preoperative cardiovascular examination 11/21/2019  . PVC (premature ventricular contraction) 11/05/2016  . PVC's (premature ventricular contractions)   . S/P total knee arthroplasty, left 12/26/2019  . Skin cancer of face   . Ventricular premature beats 11/05/2016  . Wolff-Parkinson-White (WPW) pattern 11/05/2016  . Wolff-Parkinson-White (WPW) syndrome   . WPW (Wolff-Parkinson-White syndrome) 11/05/2016    Past Surgical History:  Procedure Laterality Date  . ANTERIOR CERVICAL DECOMP/DISCECTOMY FUSION N/A 11/05/2016   Procedure: ANTERIOR CERVICAL DECOMPRESSION/DISCECTOMY FUSION  - CERVICAL THREE-FOUR, CERVICAL FIVE-SIX, CERVICAL SIX -SEVEN;  Surgeon: Kary Kos, MD;  Location: [facility];  Service: Neurosurgery;  Laterality: N/A;  . COLONOSCOPY    . EYE SURGERY     both eyes lens implants   . SKIN CANCER EXCISION     Face  . TONSILLECTOMY    . TOTAL KNEE ARTHROPLASTY Left 12/26/2019   Procedure: TOTAL KNEE ARTHROPLASTY;  Surgeon: Paralee Cancel, MD;  Location: [facility];  Service: Orthopedics;  Laterality: Left;  70 mins  . TUBAL LIGATION      Family History  Problem Relation Age of Onset  . High Cholesterol Mother   . High blood pressure Mother   . High blood pressure Brother    Social History:  reports that she has quit smoking. Her smoking use included cigarettes. She has never used smokeless tobacco. She reports that she does not drink alcohol and does not use drugs.  Allergies:  Allergies  Allergen Reactions  . Bee Venom Itching, Swelling and Other (See Comments)    Passed out  . Parafon Forte Dsc [Chlorzoxazone] Swelling and Other (See Comments)    Lips swelling and lips felt sunburned.  . Sulfa Antibiotics Hives and Itching    Medications Prior to Admission  Medication Sig Dispense Refill  . atorvastatin (LIPITOR) 20 MG tablet Take 20 mg by mouth at bedtime.    . calcium carbonate (OS-CAL) 600 MG TABS tablet Take 600 mg by mouth 2 (two) times daily with a meal.    . cyclobenzaprine (FLEXERIL) 10 MG tablet Take 10 mg by mouth 3 (three) times daily as needed for muscle spasms.    . flecainide (TAMBOCOR) 100 MG tablet Take 100 mg by mouth 2 (two) times daily.    . fluconazole (DIFLUCAN) 200 MG tablet Take 200 mg by mouth once a week.    . levothyroxine (SYNTHROID) 100 MCG tablet Take 100 mcg by mouth daily before  breakfast.    . Multiple Vitamin (MULTIVITAMIN WITH MINERALS) TABS tablet Take 1 tablet by mouth daily. Centrum Silver    . omeprazole (PRILOSEC) 40 MG capsule Take 40 mg by mouth daily.    . polyethylene glycol (MIRALAX / GLYCOLAX) 17 g packet Take 17 g by mouth as needed for mild constipation.    Marland Kitchen rOPINIRole (REQUIP) 1 MG tablet Take 5 mg by mouth at bedtime.    . sertraline (ZOLOFT) 100 MG tablet Take 200 mg by mouth daily.    . traZODone (DESYREL)  100 MG tablet Take 100 mg by mouth at bedtime.      Results for orders placed or performed during the hospital encounter of 07/15/20 (from the past 48 hour(s))  Surgical pcr screen     Status: None   Collection Time: 07/15/20 10:37 AM   Specimen: Nasal Mucosa; Nasal Swab  Result Value Ref Range   MRSA, PCR NEGATIVE NEGATIVE   Staphylococcus aureus NEGATIVE NEGATIVE    Comment: (NOTE) The Xpert SA Assay (FDA approved for NASAL specimens in patients 67 years of age and older), is one component of a comprehensive surveillance program. It is not intended to diagnose infection nor to guide or monitor treatment. Performed at [facility], [place] [address] [place], [place] [address]   SARS CORONAVIRUS 2 (TAT 6-24 HRS) Nasopharyngeal Nasopharyngeal Swab     Status: None   Collection Time: 07/15/20 10:37 AM   Specimen: Nasopharyngeal Swab  Result Value Ref Range   SARS Coronavirus 2 NEGATIVE NEGATIVE    Comment: (NOTE) SARS-CoV-2 target nucleic acids are NOT DETECTED.  The SARS-CoV-2 RNA is generally detectable in upper and lower respiratory specimens during the acute phase of infection. Negative results do not preclude SARS-CoV-2 infection, do not rule out co-infections with other pathogens, and should not be used as the sole basis for treatment or other patient management decisions. Negative results must be combined with clinical observations, patient history, and epidemiological information. The expected result is Negative.  Fact Sheet for Patients: SugarRoll.be  Fact Sheet for Healthcare Providers: https://www.woods-mathews.com/  This test is not yet approved or cleared by the [place] FDA and  has been authorized for detection and/or diagnosis of SARS-CoV-2 by FDA under an Emergency Use Authorization (EUA). This EUA will remain  in effect (meaning this test can be used) for the duration of the COVID-19 declaration under Se  ction 564(b)(1) of the Act, 21 U.S.C. section 360bbb-3(b)(1), unless the authorization is terminated or revoked sooner.  Performed at [facility], [place] [address] [place], [place] [address]   Basic metabolic panel     Status: Abnormal   Collection Time: 07/15/20 10:38 AM  Result Value Ref Range   Sodium 142 135 - 145 mmol/L   Potassium 4.7 3.5 - 5.1 mmol/L   Chloride 106 98 - 111 mmol/L   CO2 30 22 - 32 mmol/L   Glucose, Bld 90 70 - 99 mg/dL    Comment: Glucose reference range applies only to samples taken after fasting for at least 8 hours.   BUN 17 8 - 23 mg/dL   Creatinine, Ser 1.11 (H) 0.44 - 1.00 mg/dL   Calcium 9.5 8.9 - 10.3 mg/dL   GFR, Estimated 52 (L) >60 mL/min    Comment: (NOTE) Calculated using the CKD-EPI Creatinine Equation (2021)    Anion gap 6 5 - 15    Comment: Performed at [facility] [address] [place], [place] [address]  CBC     Status: None   Collection Time: 07/15/20 10:38 AM  Result Value Ref Range   WBC 8.4 4.0 - 10.5 K/uL   RBC 4.24 3.87 - 5.11 MIL/uL   Hemoglobin 12.3 12.0 - 15.0 g/dL   HCT 39.4 36.0 - 46.0 %   MCV 92.9 80.0 - 100.0 fL   MCH 29.0 26.0 - 34.0 pg   MCHC 31.2 30.0 - 36.0 g/dL   RDW 14.1 11.5 - 15.5 %   Platelets 322 150 - 400 K/uL   nRBC 0.0 0.0 - 0.2 %    Comment: Performed at [facility], [place] [address] [place], [place] [address]   No results found.  Review of Systems  Musculoskeletal: Positive for back pain.  Neurological: Positive for numbness.    Blood pressure 130/71, pulse 64, temperature 98.1 F (36.7 C), temperature source Oral, resp. rate 17, height 5\' 3"  (1.6 m), weight 71.2 kg, SpO2 98 %. Physical Exam HENT:     Head: Normocephalic.     Right Ear: Tympanic membrane normal.     Nose: Nose normal.     Mouth/Throat:     Mouth: Mucous membranes are moist.  Cardiovascular:     Rate and Rhythm: Normal rate.  Pulmonary:     Effort: Pulmonary effort is normal.  Abdominal:     General:  Abdomen is flat.  Musculoskeletal:        General: Normal range of motion.  Skin:    General: Skin is warm.  Neurological:     General: No focal deficit present.     Mental Status: She is alert.     Comments: Strength 5-5 iliopsoas, quads, hamstrings, gastrocs, Ant tibialis, EHL.      Assessment/Plan 75 year old presents for decompressive laminectomies and possible discectomies at T10-11 and L1-L2.  Elaina Hoops, MD 07/17/2020, 8:03 AM

## 2020-07-18 ENCOUNTER — Encounter (HOSPITAL_COMMUNITY): Payer: Self-pay | Admitting: Neurosurgery

## 2020-07-18 DIAGNOSIS — Z91030 Bee allergy status: Secondary | ICD-10-CM | POA: Diagnosis not present

## 2020-07-18 DIAGNOSIS — Z85828 Personal history of other malignant neoplasm of skin: Secondary | ICD-10-CM | POA: Diagnosis not present

## 2020-07-18 DIAGNOSIS — Z79890 Hormone replacement therapy (postmenopausal): Secondary | ICD-10-CM | POA: Diagnosis not present

## 2020-07-18 DIAGNOSIS — M5414 Radiculopathy, thoracic region: Secondary | ICD-10-CM | POA: Diagnosis not present

## 2020-07-18 DIAGNOSIS — Z79899 Other long term (current) drug therapy: Secondary | ICD-10-CM | POA: Diagnosis not present

## 2020-07-18 DIAGNOSIS — Z87891 Personal history of nicotine dependence: Secondary | ICD-10-CM | POA: Diagnosis not present

## 2020-07-18 DIAGNOSIS — G992 Myelopathy in diseases classified elsewhere: Secondary | ICD-10-CM | POA: Diagnosis not present

## 2020-07-18 DIAGNOSIS — Z96652 Presence of left artificial knee joint: Secondary | ICD-10-CM | POA: Diagnosis not present

## 2020-07-18 DIAGNOSIS — M4804 Spinal stenosis, thoracic region: Secondary | ICD-10-CM | POA: Diagnosis not present

## 2020-07-18 MED ORDER — OXYCODONE HCL 10 MG PO TABS: 10.0000 mg | ORAL_TABLET | Freq: Four times a day (QID) | ORAL | 0 refills | Status: DC | PRN

## 2020-07-18 NOTE — Discharge Summary (Signed)
  Physician Discharge Summary  Patient ID: Laura Singleton MRN: [id number] DOB/AGE: [birth year] 75 y.o. Estimated body mass index is 27.81 kg/m as calculated from the following:   Height as of this encounter: 5\' 3"  (1.6 m).   Weight as of this encounter: 71.2 kg.   Admit date: 07/17/2020 Discharge date: 07/18/2020  Admission Diagnoses: Thoracic myelopathy from thoracic spinal stenosis T10-11 and lumbar spinal stenosis L1-L2  Discharge Diagnoses: Same Active Problems:   Spinal stenosis   Discharged Condition: good  Hospital Course: Patient was admitted to hospital underwent decompressive thoracic laminectomy T10-11 as well as decompressive lumbar laminectomy L1-L2 postoperative patient is doing fairly well improved numbness in her legs ambulating well voiding patient is stable for discharge home with scheduled follow-up in 1 to 2 weeks.  Consults: Significant Diagnostic Studies: Treatments: Decompressive thoracic laminectomy T10-11 and L1-L2 Discharge Exam: Blood pressure 112/60, pulse 73, temperature 98.4 F (36.9 C), temperature source Oral, resp. rate 18, height 5\' 3"  (1.6 m), weight 71.2 kg, SpO2 93 %. Strength 5 out of 5 wound clean dry and intact  Disposition: Home   Allergies as of 07/18/2020      Reactions   Bee Venom Itching, Swelling, Other (See Comments)   Passed out   Parafon Forte Dsc [chlorzoxazone] Swelling, Other (See Comments)   Lips swelling and lips felt sunburned.   Sulfa Antibiotics Hives, Itching      Medication List    TAKE these medications   atorvastatin 20 MG tablet Commonly known as: LIPITOR Take 20 mg by mouth at bedtime.   calcium carbonate 600 MG Tabs tablet Commonly known as: OS-CAL Take 600 mg by mouth 2 (two) times daily with a meal.   cyclobenzaprine 10 MG tablet Commonly known as: FLEXERIL Take 10 mg by mouth 3 (three) times daily as needed for muscle spasms.   flecainide 100 MG tablet Commonly known as: TAMBOCOR Take 100 mg by mouth  2 (two) times daily.   fluconazole 200 MG tablet Commonly known as: DIFLUCAN Take 200 mg by mouth once a week.   levothyroxine 100 MCG tablet Commonly known as: SYNTHROID Take 100 mcg by mouth daily before breakfast.   multivitamin with minerals Tabs tablet Take 1 tablet by mouth daily. Centrum Silver   omeprazole 40 MG capsule Commonly known as: PRILOSEC Take 40 mg by mouth daily.   Oxycodone HCl 10 MG Tabs Take 1 tablet (10 mg total) by mouth every 6 (six) hours as needed for severe pain ((score 7 to 10)).   polyethylene glycol 17 g packet Commonly known as: MIRALAX / GLYCOLAX Take 17 g by mouth as needed for mild constipation.   rOPINIRole 1 MG tablet Commonly known as: REQUIP Take 5 mg by mouth at bedtime.   sertraline 100 MG tablet Commonly known as: ZOLOFT Take 200 mg by mouth daily.   traZODone 100 MG tablet Commonly known as: DESYREL Take 100 mg by mouth at bedtime.        Signed: Elaina Hoops 07/18/2020, 7:57 AM

## 2020-07-18 NOTE — TOC Initial Note (Signed)
Transition of Care [facility]) - Initial/Assessment Note    Patient Details  Name: Laura Singleton MRN: [id number] Date of Birth: 22-Dec-1945  [facility] CM/SW Contact:    Joanne Chars, LCSW Phone Number: 07/18/2020, 9:38 AM  Clinical Narrative:   CSW spoke with pt by phone to complete assessment.  Pt agreeable to [facility] referral, discussed options for [facility] agencies and pt agreeable to [facility].  Pt lives alone but son will be staying with her after discharge.  Current equipment in home: cane, 3 walkers, wheelchair, shower bench.                  Expected Discharge Plan: [place] Barriers to Discharge: No Barriers Identified   Patient Goals and CMS Choice Patient states their goals for this hospitalization and ongoing recovery are:: "get back to water aerobics" CMS Medicare.gov Compare Post Acute Care list provided to::  (remote assessment--discussed choice verbally)    Expected Discharge Plan and Services Expected Discharge Plan: [place] Choice: [place] arrangements for the past 2 months: Single Family Home Expected Discharge Date: 07/18/20               DME Arranged: N/A (DME by [facility] staff)         HH Arranged: PT HH Agency: [place] Date [facility] Agency Contacted: 07/18/20 Time HH Agency Contacted: [phone] Representative spoke with at [place]: Tommi Rumps  Prior Living Arrangements/Services Living arrangements for the past 2 months: [place] Lives with:: Self Patient language and need for interpreter reviewed:: Yes Do you feel safe going back to the place where you live?: Yes      Need for Family Participation in Patient Care: Yes (Comment) Care giver support system in place?: Yes (comment) (son will be staying with pt after discharge) Current home services: Home PT Criminal Activity/Legal Involvement Pertinent to Current Situation/Hospitalization: No - Comment as needed  Activities of  Daily Living      Permission Sought/Granted                  Emotional Assessment Appearance::  (phone asess--unable to assess) Attitude/Demeanor/Rapport: Engaged Affect (typically observed): Appropriate,Pleasant Orientation: : Oriented to Self,Oriented to Place,Oriented to  Time,Oriented to Situation Alcohol / Substance Use: Not Applicable Psych Involvement: No (comment)  Admission diagnosis:  Spinal stenosis [M48.00] Patient Active Problem List   Diagnosis Date Noted  . Spinal stenosis 07/17/2020  . Encounter for monitoring flecainide therapy 05/29/2020  . Cardiac murmur 05/29/2020  . Skin cancer of face   . Hypothyroidism 03/28/2020  . GERD (gastroesophageal reflux disease) 03/28/2020  . Cardiac arrhythmia 03/28/2020  . S/P total knee arthroplasty, left 12/26/2019  . History of total knee arthroplasty 12/26/2019  . Preoperative cardiovascular examination 11/21/2019  . Wolff-Parkinson-White (WPW) syndrome   . PVC's (premature ventricular contractions)   . Dysrhythmia   . Depression   . Arthritis   . Chronic pain of left knee 11/17/2018  . WPW (Wolff-Parkinson-White syndrome) 11/05/2016  . PVC (premature ventricular contraction) 11/05/2016  . Hyperlipidemia 11/05/2016  . Myelopathy of cervical spinal cord with cervical radiculopathy ([place]) 11/05/2016  . Cervical myelopathy ([place]) 11/05/2016  . Ventricular premature beats 11/05/2016  . Wolff-Parkinson-White (WPW) pattern 11/05/2016   PCP:  [name], MD Pharmacy:   [place], [place] [address] Phone: [phone] Fax: [phone]  [place], [place] [address] Phone: [phone] Fax: [phone]     Social Determinants of Health (SDOH) Interventions    Readmission Risk Interventions No flowsheet data found.

## 2020-07-18 NOTE — Progress Notes (Signed)
Patient alert and oriented, voiding adequately, skin clean, dry and intact without evidence of skin break down, or symptoms of complications - no redness or edema noted, only slight tenderness at site.  Patient states pain is manageable at time of discharge. Patient has an appointment with MD in 2 weeks 

## 2020-07-18 NOTE — Evaluation (Signed)
Physical Therapy Evaluation Patient Details Name: Laura Singleton MRN: [id number] DOB: June 20, 1945 Today's Date: 07/18/2020   History of Present Illness  Pt is a 75 y/o female who presents s/p L1-L2 Laminectomy and T10-T11 laminectomy on 07/17/2020. PMH significant for Wolff-Parkinson-White syndrome, PVC's, skin CA, L TKR, hypothyridism, ACDF 2018.    Clinical Impression  Pt admitted with above diagnosis. At the time of PT eval, pt was able to demonstrate transfers and ambulation with gross min guard assist to min assist and RW for support. Pt was educated on precautions, positioning recommendations, appropriate activity progression, and car transfer. Pt currently with functional limitations due to the deficits listed below (see PT Problem List). Pt will benefit from skilled PT to increase their independence and safety with mobility to allow discharge to the venue listed below.      Follow Up Recommendations Home health PT;Supervision for mobility/OOB    Equipment Recommendations  None recommended by PT    Recommendations for Other Services       Precautions / Restrictions Precautions Precautions: Fall;Back Precaution Booklet Issued: Yes (comment) Precaution Comments: Reviewed handout and pt was cued for precautions during functional mobility. Spinal Brace:  (No brace needed order) Restrictions Weight Bearing Restrictions: No      Mobility  Bed Mobility Overal bed mobility: Needs Assistance Bed Mobility: Rolling;Sidelying to Sit Rolling: Min guard Sidelying to sit: Min guard       General bed mobility comments: Poor log roll technique. Pt required cues but not making corrective changes.    Transfers Overall transfer level: Needs assistance Equipment used: Rolling walker (2 wheeled) Transfers: Sit to/from Stand Sit to Stand: Supervision         General transfer comment: Close supervision for safety. No assist required. VC's for hand placement on seated surface for  safety.  Ambulation/Gait Ambulation/Gait assistance: Min guard;Min assist Gait Distance (Feet): 400 Feet Assistive device: Rolling walker (2 wheeled) Gait Pattern/deviations: Step-through pattern;Decreased stride length;Trunk flexed;Drifts right/left;Leaning posteriorly Gait velocity: Decreased Gait velocity interpretation: <1.31 ft/sec, indicative of household ambulator General Gait Details: Drifting and occasional posterior LOB noted during gait training. Occasional assist required and hands on guarding provided throughout ambulation.  Stairs            Wheelchair Mobility    Modified Rankin (Stroke Patients Only)       Balance Overall balance assessment: Needs assistance Sitting-balance support: Feet supported;No upper extremity supported Sitting balance-Leahy Scale: Fair     Standing balance support: Bilateral upper extremity supported;During functional activity Standing balance-Leahy Scale: Poor Standing balance comment: reliant on UE support                             Pertinent Vitals/Pain Pain Assessment: Faces Faces Pain Scale: Hurts little more Pain Location: Incision site Pain Descriptors / Indicators: Operative site guarding;Discomfort Pain Intervention(s): Limited activity within patient's tolerance;Monitored during session;Repositioned    Home Living Family/patient expects to be discharged to:: Private residence Living Arrangements: Alone;Children Available Help at Discharge: Family Type of Home: House Home Access: Ramped entrance     Home Layout: One level Home Equipment: [place] - 2 wheels;Cane - single point;Bedside commode;Tub bench;Walker - 4 wheels;Wheelchair - manual      Prior Function Level of Independence: Independent with assistive device(s)         Comments: Furniture surfs or uses cane/walker when out of the house     Hand Dominance   Dominant Hand: Right  Extremity/Trunk Assessment   Upper Extremity  Assessment Upper Extremity Assessment: Defer to OT evaluation    Lower Extremity Assessment Lower Extremity Assessment: Generalized weakness    Cervical / Trunk Assessment Cervical / Trunk Assessment: Kyphotic;Other exceptions Cervical / Trunk Exceptions: s/p surgery  Communication   Communication: No difficulties  Cognition Arousal/Alertness: Awake/alert Behavior During Therapy: WFL for tasks assessed/performed Overall Cognitive Status: Within Functional Limits for tasks assessed                                        General Comments      Exercises     Assessment/Plan    PT Assessment Patient needs continued PT services  PT Problem List Decreased strength;Decreased activity tolerance;Decreased balance;Decreased mobility;Decreased knowledge of use of DME;Decreased safety awareness;Decreased knowledge of precautions;Pain       PT Treatment Interventions DME instruction;Gait training;Functional mobility training;Therapeutic activities;Therapeutic exercise;Neuromuscular re-education;Patient/family education    PT Goals (Current goals can be found in the Care Plan section)  Acute Rehab PT Goals Patient Stated Goal: Home today PT Goal Formulation: With patient Time For Goal Achievement: 07/25/20 Potential to Achieve Goals: Good    Frequency Min 5X/week   Barriers to discharge        Co-evaluation               AM-PAC PT "6 Clicks" Mobility  Outcome Measure Help needed turning from your back to your side while in a flat bed without using bedrails?: A Little Help needed moving from lying on your back to sitting on the side of a flat bed without using bedrails?: A Little Help needed moving to and from a bed to a chair (including a wheelchair)?: A Little Help needed standing up from a chair using your arms (e.g., wheelchair or bedside chair)?: A Little Help needed to walk in hospital room?: A Little Help needed climbing 3-5 steps with a railing? :  A Little 6 Click Score: 18    End of Session Equipment Utilized During Treatment: Gait belt Activity Tolerance: Patient tolerated treatment well Patient left: in chair;with call bell/phone within reach Nurse Communication: Mobility status PT Visit Diagnosis: Unsteadiness on feet (R26.81);Pain Pain - part of body:  (back)    Time: 9242-6834 PT Time Calculation (min) (ACUTE ONLY): 15 min   Charges:   PT Evaluation $PT Eval Low Complexity: 1 Low          Rolinda Roan, PT, DPT Acute Rehabilitation Services Pager: [phone] Office: [phone]   Thelma Comp 07/18/2020, 9:01 AM

## 2020-07-18 NOTE — Plan of Care (Signed)
Adequately ready for discharge 

## 2020-07-18 NOTE — Discharge Instructions (Addendum)
Wound Care  Keep the incision clean and dry remove the outer dressing in 2 days, leave the Steri-Strips intact.  Do not put any creams, lotions, or ointments on incision. Leave steri-strips on back.  They will fall off by themselves.  Activity Walk each and every day, increasing distance each day. No lifting greater than 5 lbs.  Avoid excessive back and neck bending and twisting.. No lifting no bending no twisting no driving or riding a car unless coming back and forth to see me.   Diet Resume your normal diet.   Call Your Doctor If Any of These Occur Redness, drainage, or swelling at the wound.  Temperature greater than 101 degrees. Severe pain not relieved by pain medication. Incision starts to come apart. Follow Up Appt Call today for appointment in 1-2 weeks ([phone]) or for problems.  If you have any hardware placed in your spine, you will need an x-ray before your appointment.

## 2020-07-18 NOTE — Anesthesia Postprocedure Evaluation (Signed)
Anesthesia Post Note  Patient: Laura Singleton  Procedure(s) Performed: Laminectomy and Foraminotomy - bilateral - Thoracic ten-Thoracic eleven - Lumbar one-Lumbar two (Bilateral Back)     Patient location during evaluation: PACU Anesthesia Type: General Level of consciousness: awake and alert Pain management: pain level controlled Vital Signs Assessment: post-procedure vital signs reviewed and stable Respiratory status: spontaneous breathing, nonlabored ventilation, respiratory function stable and patient connected to nasal cannula oxygen Cardiovascular status: blood pressure returned to baseline and stable Postop Assessment: no apparent nausea or vomiting Anesthetic complications: no   No complications documented.  Last Vitals:  Vitals:   07/18/20 0425 07/18/20 0729  BP: (!) 102/57 112/60  Pulse: 68 73  Resp: 18 18  Temp: 36.9 C 36.9 C  SpO2: 99% 93%    Last Pain:  Vitals:   07/18/20 0729  TempSrc: Oral  PainSc:                  [place]

## 2020-07-20 DIAGNOSIS — K219 Gastro-esophageal reflux disease without esophagitis: Secondary | ICD-10-CM | POA: Diagnosis not present

## 2020-07-20 DIAGNOSIS — Z87891 Personal history of nicotine dependence: Secondary | ICD-10-CM | POA: Diagnosis not present

## 2020-07-20 DIAGNOSIS — E039 Hypothyroidism, unspecified: Secondary | ICD-10-CM | POA: Diagnosis not present

## 2020-07-20 DIAGNOSIS — I456 Pre-excitation syndrome: Secondary | ICD-10-CM | POA: Diagnosis not present

## 2020-07-20 DIAGNOSIS — E785 Hyperlipidemia, unspecified: Secondary | ICD-10-CM | POA: Diagnosis not present

## 2020-07-20 DIAGNOSIS — Z4789 Encounter for other orthopedic aftercare: Secondary | ICD-10-CM | POA: Diagnosis not present

## 2020-07-20 DIAGNOSIS — F32A Depression, unspecified: Secondary | ICD-10-CM | POA: Diagnosis not present

## 2020-07-20 DIAGNOSIS — G959 Disease of spinal cord, unspecified: Secondary | ICD-10-CM | POA: Diagnosis not present

## 2020-07-20 DIAGNOSIS — Z981 Arthrodesis status: Secondary | ICD-10-CM | POA: Diagnosis not present

## 2020-07-22 ENCOUNTER — Institutional Professional Consult (permissible substitution): Payer: Medicare HMO | Admitting: Cardiology

## 2020-07-23 DIAGNOSIS — Z981 Arthrodesis status: Secondary | ICD-10-CM | POA: Diagnosis not present

## 2020-07-23 DIAGNOSIS — K219 Gastro-esophageal reflux disease without esophagitis: Secondary | ICD-10-CM | POA: Diagnosis not present

## 2020-07-23 DIAGNOSIS — Z87891 Personal history of nicotine dependence: Secondary | ICD-10-CM | POA: Diagnosis not present

## 2020-07-23 DIAGNOSIS — G959 Disease of spinal cord, unspecified: Secondary | ICD-10-CM | POA: Diagnosis not present

## 2020-07-23 DIAGNOSIS — I456 Pre-excitation syndrome: Secondary | ICD-10-CM | POA: Diagnosis not present

## 2020-07-23 DIAGNOSIS — E039 Hypothyroidism, unspecified: Secondary | ICD-10-CM | POA: Diagnosis not present

## 2020-07-23 DIAGNOSIS — F32A Depression, unspecified: Secondary | ICD-10-CM | POA: Diagnosis not present

## 2020-07-23 DIAGNOSIS — E785 Hyperlipidemia, unspecified: Secondary | ICD-10-CM | POA: Diagnosis not present

## 2020-07-23 DIAGNOSIS — Z4789 Encounter for other orthopedic aftercare: Secondary | ICD-10-CM | POA: Diagnosis not present

## 2020-07-25 DIAGNOSIS — Z9889 Other specified postprocedural states: Secondary | ICD-10-CM | POA: Diagnosis not present

## 2020-07-25 DIAGNOSIS — Z9181 History of falling: Secondary | ICD-10-CM | POA: Diagnosis not present

## 2020-07-25 DIAGNOSIS — M62830 Muscle spasm of back: Secondary | ICD-10-CM | POA: Diagnosis not present

## 2020-07-25 DIAGNOSIS — F3341 Major depressive disorder, recurrent, in partial remission: Secondary | ICD-10-CM | POA: Diagnosis not present

## 2020-07-25 DIAGNOSIS — E039 Hypothyroidism, unspecified: Secondary | ICD-10-CM | POA: Diagnosis not present

## 2020-07-25 DIAGNOSIS — Z6825 Body mass index (BMI) 25.0-25.9, adult: Secondary | ICD-10-CM | POA: Diagnosis not present

## 2020-07-26 DIAGNOSIS — Z4789 Encounter for other orthopedic aftercare: Secondary | ICD-10-CM | POA: Diagnosis not present

## 2020-07-26 DIAGNOSIS — K219 Gastro-esophageal reflux disease without esophagitis: Secondary | ICD-10-CM | POA: Diagnosis not present

## 2020-07-26 DIAGNOSIS — I456 Pre-excitation syndrome: Secondary | ICD-10-CM | POA: Diagnosis not present

## 2020-07-26 DIAGNOSIS — E039 Hypothyroidism, unspecified: Secondary | ICD-10-CM | POA: Diagnosis not present

## 2020-07-26 DIAGNOSIS — G959 Disease of spinal cord, unspecified: Secondary | ICD-10-CM | POA: Diagnosis not present

## 2020-07-26 DIAGNOSIS — Z981 Arthrodesis status: Secondary | ICD-10-CM | POA: Diagnosis not present

## 2020-07-26 DIAGNOSIS — F32A Depression, unspecified: Secondary | ICD-10-CM | POA: Diagnosis not present

## 2020-07-26 DIAGNOSIS — Z87891 Personal history of nicotine dependence: Secondary | ICD-10-CM | POA: Diagnosis not present

## 2020-07-26 DIAGNOSIS — E785 Hyperlipidemia, unspecified: Secondary | ICD-10-CM | POA: Diagnosis not present

## 2020-07-29 DIAGNOSIS — G959 Disease of spinal cord, unspecified: Secondary | ICD-10-CM | POA: Diagnosis not present

## 2020-07-29 DIAGNOSIS — E039 Hypothyroidism, unspecified: Secondary | ICD-10-CM | POA: Diagnosis not present

## 2020-07-29 DIAGNOSIS — F32A Depression, unspecified: Secondary | ICD-10-CM | POA: Diagnosis not present

## 2020-07-29 DIAGNOSIS — Z4789 Encounter for other orthopedic aftercare: Secondary | ICD-10-CM | POA: Diagnosis not present

## 2020-07-29 DIAGNOSIS — I456 Pre-excitation syndrome: Secondary | ICD-10-CM | POA: Diagnosis not present

## 2020-07-29 DIAGNOSIS — Z981 Arthrodesis status: Secondary | ICD-10-CM | POA: Diagnosis not present

## 2020-07-29 DIAGNOSIS — K219 Gastro-esophageal reflux disease without esophagitis: Secondary | ICD-10-CM | POA: Diagnosis not present

## 2020-07-29 DIAGNOSIS — Z87891 Personal history of nicotine dependence: Secondary | ICD-10-CM | POA: Diagnosis not present

## 2020-07-29 DIAGNOSIS — E785 Hyperlipidemia, unspecified: Secondary | ICD-10-CM | POA: Diagnosis not present

## 2020-08-01 DIAGNOSIS — G959 Disease of spinal cord, unspecified: Secondary | ICD-10-CM | POA: Diagnosis not present

## 2020-08-01 DIAGNOSIS — E785 Hyperlipidemia, unspecified: Secondary | ICD-10-CM | POA: Diagnosis not present

## 2020-08-01 DIAGNOSIS — Z981 Arthrodesis status: Secondary | ICD-10-CM | POA: Diagnosis not present

## 2020-08-01 DIAGNOSIS — I456 Pre-excitation syndrome: Secondary | ICD-10-CM | POA: Diagnosis not present

## 2020-08-01 DIAGNOSIS — K219 Gastro-esophageal reflux disease without esophagitis: Secondary | ICD-10-CM | POA: Diagnosis not present

## 2020-08-01 DIAGNOSIS — F32A Depression, unspecified: Secondary | ICD-10-CM | POA: Diagnosis not present

## 2020-08-01 DIAGNOSIS — Z4789 Encounter for other orthopedic aftercare: Secondary | ICD-10-CM | POA: Diagnosis not present

## 2020-08-01 DIAGNOSIS — E039 Hypothyroidism, unspecified: Secondary | ICD-10-CM | POA: Diagnosis not present

## 2020-08-01 DIAGNOSIS — Z87891 Personal history of nicotine dependence: Secondary | ICD-10-CM | POA: Diagnosis not present

## 2020-08-07 DIAGNOSIS — E039 Hypothyroidism, unspecified: Secondary | ICD-10-CM | POA: Diagnosis not present

## 2020-08-07 DIAGNOSIS — Z981 Arthrodesis status: Secondary | ICD-10-CM | POA: Diagnosis not present

## 2020-08-07 DIAGNOSIS — G959 Disease of spinal cord, unspecified: Secondary | ICD-10-CM | POA: Diagnosis not present

## 2020-08-07 DIAGNOSIS — K219 Gastro-esophageal reflux disease without esophagitis: Secondary | ICD-10-CM | POA: Diagnosis not present

## 2020-08-07 DIAGNOSIS — Z4789 Encounter for other orthopedic aftercare: Secondary | ICD-10-CM | POA: Diagnosis not present

## 2020-08-07 DIAGNOSIS — Z87891 Personal history of nicotine dependence: Secondary | ICD-10-CM | POA: Diagnosis not present

## 2020-08-07 DIAGNOSIS — I456 Pre-excitation syndrome: Secondary | ICD-10-CM | POA: Diagnosis not present

## 2020-08-07 DIAGNOSIS — F32A Depression, unspecified: Secondary | ICD-10-CM | POA: Diagnosis not present

## 2020-08-07 DIAGNOSIS — E785 Hyperlipidemia, unspecified: Secondary | ICD-10-CM | POA: Diagnosis not present

## 2020-08-14 DIAGNOSIS — Z4789 Encounter for other orthopedic aftercare: Secondary | ICD-10-CM | POA: Diagnosis not present

## 2020-08-14 DIAGNOSIS — E039 Hypothyroidism, unspecified: Secondary | ICD-10-CM | POA: Diagnosis not present

## 2020-08-14 DIAGNOSIS — Z87891 Personal history of nicotine dependence: Secondary | ICD-10-CM | POA: Diagnosis not present

## 2020-08-14 DIAGNOSIS — I456 Pre-excitation syndrome: Secondary | ICD-10-CM | POA: Diagnosis not present

## 2020-08-14 DIAGNOSIS — F32A Depression, unspecified: Secondary | ICD-10-CM | POA: Diagnosis not present

## 2020-08-14 DIAGNOSIS — E785 Hyperlipidemia, unspecified: Secondary | ICD-10-CM | POA: Diagnosis not present

## 2020-08-14 DIAGNOSIS — G959 Disease of spinal cord, unspecified: Secondary | ICD-10-CM | POA: Diagnosis not present

## 2020-08-14 DIAGNOSIS — Z981 Arthrodesis status: Secondary | ICD-10-CM | POA: Diagnosis not present

## 2020-08-14 DIAGNOSIS — K219 Gastro-esophageal reflux disease without esophagitis: Secondary | ICD-10-CM | POA: Diagnosis not present

## 2020-08-22 DIAGNOSIS — Z4789 Encounter for other orthopedic aftercare: Secondary | ICD-10-CM | POA: Diagnosis not present

## 2020-08-22 DIAGNOSIS — Z87891 Personal history of nicotine dependence: Secondary | ICD-10-CM | POA: Diagnosis not present

## 2020-08-22 DIAGNOSIS — I456 Pre-excitation syndrome: Secondary | ICD-10-CM | POA: Diagnosis not present

## 2020-08-22 DIAGNOSIS — K219 Gastro-esophageal reflux disease without esophagitis: Secondary | ICD-10-CM | POA: Diagnosis not present

## 2020-08-22 DIAGNOSIS — F32A Depression, unspecified: Secondary | ICD-10-CM | POA: Diagnosis not present

## 2020-08-22 DIAGNOSIS — Z981 Arthrodesis status: Secondary | ICD-10-CM | POA: Diagnosis not present

## 2020-08-22 DIAGNOSIS — E785 Hyperlipidemia, unspecified: Secondary | ICD-10-CM | POA: Diagnosis not present

## 2020-08-22 DIAGNOSIS — G959 Disease of spinal cord, unspecified: Secondary | ICD-10-CM | POA: Diagnosis not present

## 2020-08-22 DIAGNOSIS — E039 Hypothyroidism, unspecified: Secondary | ICD-10-CM | POA: Diagnosis not present

## 2020-08-26 ENCOUNTER — Ambulatory Visit (INDEPENDENT_AMBULATORY_CARE_PROVIDER_SITE_OTHER): Payer: Medicare HMO | Admitting: Cardiology

## 2020-08-26 ENCOUNTER — Encounter: Payer: Self-pay | Admitting: Cardiology

## 2020-08-26 ENCOUNTER — Other Ambulatory Visit: Payer: Self-pay

## 2020-08-26 VITALS — BP 143/85 | HR 64 | Ht 63.0 in | Wt 155.0 lb

## 2020-08-26 DIAGNOSIS — I456 Pre-excitation syndrome: Secondary | ICD-10-CM | POA: Diagnosis not present

## 2020-08-26 NOTE — Progress Notes (Signed)
Electrophysiology Office Note   Date:  08/26/2020   ID:  Laura Singleton, DOB 08-Nov-1945, MRN [id number]  PCP:  [name], MD  Cardiologist:  Revankar Primary Electrophysiologist:  Sufyaan Palma Meredith Leeds, MD    Chief Complaint: PVC   History of Present Illness: Laura Singleton is a 75 y.o. female who is being seen today for the evaluation of PVC at the request of Revankar, Reita Cliche, MD. Presenting today for electrophysiology evaluation.  She has a history significant for frequent PVCs and WPW.  She was initiated on flecainide approximately 30 years ago and tolerated it well.  Her WPW was diagnosed several years ago.  She was initially followed by Dr. Caryl Comes who put her on flecainide.  He did offer ablation at [facility], but she stated that she would like to avoid invasive procedures.  Today, she denies symptoms of palpitations, chest pain, shortness of breath, orthopnea, PND, lower extremity edema, claudication, dizziness, presyncope, syncope, bleeding, or neurologic sequela. The patient is tolerating medications without difficulties.  She continues to do well.  She has no chest pain or shortness of breath.  She is able to all of her daily activities.  She is limited by back pain.  She has had 3 back surgeries.  She also needs a knee replacement.   Past Medical History:  Diagnosis Date   Arthritis    Cardiac arrhythmia 03/28/2020   Cervical myelopathy (HCC) 11/05/2016   Chronic pain of left knee 11/17/2018   Depression    Dysrhythmia    GERD (gastroesophageal reflux disease)    History of total knee arthroplasty 12/26/2019   Hyperlipidemia 11/05/2016   Hypothyroidism    Myelopathy of cervical spinal cord with cervical radiculopathy (HCC) 11/05/2016   Preoperative cardiovascular examination 11/21/2019   PVC (premature ventricular contraction) 11/05/2016   PVC's (premature ventricular contractions)    S/P total knee arthroplasty, left 12/26/2019   Skin cancer of face    Ventricular premature beats  11/05/2016   Wolff-Parkinson-White (WPW) pattern 11/05/2016   Wolff-Parkinson-White (WPW) syndrome    WPW (Wolff-Parkinson-White syndrome) 11/05/2016   Past Surgical History:  Procedure Laterality Date   ANTERIOR CERVICAL DECOMP/DISCECTOMY FUSION N/A 11/05/2016   Procedure: ANTERIOR CERVICAL DECOMPRESSION/DISCECTOMY FUSION  - CERVICAL THREE-FOUR, CERVICAL FIVE-SIX, CERVICAL SIX -SEVEN;  Surgeon: Kary Kos, MD;  Location: [place];  Service: Neurosurgery;  Laterality: N/A;   COLONOSCOPY     EYE SURGERY     both eyes lens implants   LUMBAR LAMINECTOMY/DECOMPRESSION MICRODISCECTOMY Bilateral 07/17/2020   Procedure: Laminectomy and Foraminotomy - bilateral - Thoracic ten-Thoracic eleven - Lumbar one-Lumbar two;  Surgeon: Kary Kos, MD;  Location: [place];  Service: Neurosurgery;  Laterality: Bilateral;   SKIN CANCER EXCISION     Face   TONSILLECTOMY     TOTAL KNEE ARTHROPLASTY Left 12/26/2019   Procedure: TOTAL KNEE ARTHROPLASTY;  Surgeon: Paralee Cancel, MD;  Location: [facility];  Service: Orthopedics;  Laterality: Left;  70 mins   TUBAL LIGATION       Current Outpatient Medications  Medication Sig Dispense Refill   atorvastatin (LIPITOR) 20 MG tablet Take 20 mg by mouth at bedtime.     calcium carbonate (OS-CAL) 600 MG TABS tablet Take 600 mg by mouth 2 (two) times daily with a meal.     cyclobenzaprine (FLEXERIL) 10 MG tablet Take 10 mg by mouth 3 (three) times daily as needed for muscle spasms.     flecainide (TAMBOCOR) 100 MG tablet Take 100 mg by mouth 2 (two) times daily.  fluconazole (DIFLUCAN) 200 MG tablet Take 200 mg by mouth once a week.     levothyroxine (SYNTHROID) 100 MCG tablet Take 100 mcg by mouth daily before breakfast.     Multiple Vitamin (MULTIVITAMIN WITH MINERALS) TABS tablet Take 1 tablet by mouth daily. Centrum Silver     omeprazole (PRILOSEC) 40 MG capsule Take 40 mg by mouth daily.     oxyCODONE 10 MG TABS Take 1 tablet (10 mg total) by mouth every 6 (six) hours as  needed for severe pain ((score 7 to 10)). 40 tablet 0   polyethylene glycol (MIRALAX / GLYCOLAX) 17 g packet Take 17 g by mouth as needed for mild constipation.     rOPINIRole (REQUIP) 1 MG tablet Take 5 mg by mouth at bedtime.     sertraline (ZOLOFT) 100 MG tablet Take 200 mg by mouth daily.     traZODone (DESYREL) 100 MG tablet Take 100 mg by mouth at bedtime.     No current facility-administered medications for this visit.    Allergies:   Bee venom, Parafon forte dsc [chlorzoxazone], and Sulfa antibiotics   Social History:  The patient  reports that she has quit smoking. Her smoking use included cigarettes. She has never used smokeless tobacco. She reports that she does not drink alcohol and does not use drugs.   Family History:  The patient's family history includes High Cholesterol in her mother; High blood pressure in her brother and mother.    ROS:  Please see the history of present illness.   Otherwise, review of systems is positive for none.   All other systems are reviewed and negative.    PHYSICAL EXAM: VS:  BP (!) 143/85   Pulse 64   Ht 5\' 3"  (1.6 m)   Wt 155 lb (70.3 kg)   BMI 27.46 kg/m  , BMI Body mass index is 27.46 kg/m. GEN: Well nourished, well developed, in no acute distress  HEENT: normal  Neck: no JVD, carotid bruits, or masses Cardiac: RRR; no murmurs, rubs, or gallops,no edema  Respiratory:  clear to auscultation bilaterally, normal work of breathing GI: soft, nontender, nondistended, + BS MS: no deformity or atrophy  Skin: warm and dry Neuro:  Strength and sensation are intact Psych: euthymic mood, full affect  EKG:  EKG is ordered today. Personal review of the ekg ordered shows sinus rhythm, rate 64  Recent Labs: 11/21/2019: Magnesium 2.3; TSH 5.630 07/15/2020: BUN 17; Creatinine, Ser 1.11; Hemoglobin 12.3; Platelets 322; Potassium 4.7; Sodium 142    Lipid Panel  No results found for: CHOL, TRIG, HDL, CHOLHDL, VLDL, LDLCALC, LDLDIRECT   Wt  Readings from Last 3 Encounters:  08/26/20 155 lb (70.3 kg)  07/17/20 157 lb (71.2 kg)  07/15/20 157 lb 7 oz (71.4 kg)      Other studies Reviewed: Additional studies/ records that were reviewed today include: TTE 06/19/20  Review of the above records today demonstrates:   1. Left ventricular ejection fraction, by estimation, is 60 to 65%. The  left ventricle has normal function. The left ventricle has no regional  wall motion abnormalities. There is mild left ventricular hypertrophy.  Left ventricular diastolic parameters  are consistent with Grade I diastolic dysfunction (impaired relaxation).   2. Right ventricular systolic function is normal. The right ventricular  size is normal. There is normal pulmonary artery systolic pressure.   3. The mitral valve is normal in structure. No evidence of mitral valve  regurgitation. No evidence of mitral stenosis.  4. The aortic valve is normal in structure. Aortic valve regurgitation is  not visualized. No aortic stenosis is present.   5. The inferior vena cava is normal in size with greater than 50%  respiratory variability, suggesting right atrial pressure of 3 mmHg.   Myoview 12/05/19 The left ventricular ejection fraction is normal (55-65%). Nuclear stress EF: 64%. There was no ST segment deviation noted during stress. No T wave inversion was noted during stress. The study is normal. This is a low risk study.    ASSESSMENT AND PLAN:  1.  PVCs: Currently on flecainide.  High risk medication monitoring.  No PVCs on ECG today.  2.  Hyperlipidemia: Continue statin per primary physician and primary cardiology  3.  WPW: Per chart records, she saw Dr. Caryl Comes in 2012.  She was put on flecainide.  No ventricular preexcitation.  We Rees Matura continue with current management.  She has had no palpitations.  Current medicines are reviewed at length with the patient today.   The patient does not have concerns regarding her medicines.  The following  changes were made today:  none  Labs/ tests ordered today include:  Orders Placed This Encounter  Procedures   EKG 12-Lead     Disposition:   FU with Brenlynn Fake 6 months  Signed, Arsenia Goracke Meredith Leeds, MD  08/26/2020 12:31 PM     [place] [address] [phone] (office) [phone] (fax)

## 2020-08-26 NOTE — Patient Instructions (Signed)
Medication Instructions:  Your physician recommends that you continue on your current medications as directed. Please refer to the Current Medication list given to you today.  *If you need a refill on your cardiac medications before your next appointment, please call your pharmacy*   Lab Work: None ordered   Testing/Procedures: None ordered   Follow-Up: At [facility], you and your health needs are our priority.  As part of our continuing mission to provide you with exceptional heart care, we have created designated Provider Care Teams.  These Care Teams include your primary Cardiologist (physician) and Advanced Practice Providers (APPs -  Physician Assistants and Nurse Practitioners) who all work together to provide you with the care you need, when you need it.  We recommend signing up for the patient portal called "MyChart".  Sign up information is provided on this After Visit Summary.  MyChart is used to connect with patients for Virtual Visits (Telemedicine).  Patients are able to view lab/test results, encounter notes, upcoming appointments, etc.  Non-urgent messages can be sent to your provider as well.   To learn more about what you can do with MyChart, go to [email or web address].    Your next appointment:   6 month(s)  The format for your next appointment:   In Person  Provider:   Will Camnitz, MD   Thank you for choosing [facility]!!   Criselda Starke, RN [phone]     

## 2020-08-27 DIAGNOSIS — Z87891 Personal history of nicotine dependence: Secondary | ICD-10-CM | POA: Diagnosis not present

## 2020-08-27 DIAGNOSIS — I456 Pre-excitation syndrome: Secondary | ICD-10-CM | POA: Diagnosis not present

## 2020-08-27 DIAGNOSIS — E785 Hyperlipidemia, unspecified: Secondary | ICD-10-CM | POA: Diagnosis not present

## 2020-08-27 DIAGNOSIS — Z981 Arthrodesis status: Secondary | ICD-10-CM | POA: Diagnosis not present

## 2020-08-27 DIAGNOSIS — E039 Hypothyroidism, unspecified: Secondary | ICD-10-CM | POA: Diagnosis not present

## 2020-08-27 DIAGNOSIS — Z4789 Encounter for other orthopedic aftercare: Secondary | ICD-10-CM | POA: Diagnosis not present

## 2020-08-27 DIAGNOSIS — K219 Gastro-esophageal reflux disease without esophagitis: Secondary | ICD-10-CM | POA: Diagnosis not present

## 2020-08-27 DIAGNOSIS — F32A Depression, unspecified: Secondary | ICD-10-CM | POA: Diagnosis not present

## 2020-08-27 DIAGNOSIS — G959 Disease of spinal cord, unspecified: Secondary | ICD-10-CM | POA: Diagnosis not present

## 2020-08-29 DIAGNOSIS — M5416 Radiculopathy, lumbar region: Secondary | ICD-10-CM | POA: Diagnosis not present

## 2020-09-04 DIAGNOSIS — G959 Disease of spinal cord, unspecified: Secondary | ICD-10-CM | POA: Diagnosis not present

## 2020-09-04 DIAGNOSIS — F32A Depression, unspecified: Secondary | ICD-10-CM | POA: Diagnosis not present

## 2020-09-04 DIAGNOSIS — E039 Hypothyroidism, unspecified: Secondary | ICD-10-CM | POA: Diagnosis not present

## 2020-09-04 DIAGNOSIS — Z87891 Personal history of nicotine dependence: Secondary | ICD-10-CM | POA: Diagnosis not present

## 2020-09-04 DIAGNOSIS — K219 Gastro-esophageal reflux disease without esophagitis: Secondary | ICD-10-CM | POA: Diagnosis not present

## 2020-09-04 DIAGNOSIS — Z981 Arthrodesis status: Secondary | ICD-10-CM | POA: Diagnosis not present

## 2020-09-04 DIAGNOSIS — I456 Pre-excitation syndrome: Secondary | ICD-10-CM | POA: Diagnosis not present

## 2020-09-04 DIAGNOSIS — E785 Hyperlipidemia, unspecified: Secondary | ICD-10-CM | POA: Diagnosis not present

## 2020-09-04 DIAGNOSIS — Z4789 Encounter for other orthopedic aftercare: Secondary | ICD-10-CM | POA: Diagnosis not present

## 2020-09-09 DIAGNOSIS — E785 Hyperlipidemia, unspecified: Secondary | ICD-10-CM | POA: Diagnosis not present

## 2020-09-09 DIAGNOSIS — F32A Depression, unspecified: Secondary | ICD-10-CM | POA: Diagnosis not present

## 2020-09-09 DIAGNOSIS — Z87891 Personal history of nicotine dependence: Secondary | ICD-10-CM | POA: Diagnosis not present

## 2020-09-09 DIAGNOSIS — G959 Disease of spinal cord, unspecified: Secondary | ICD-10-CM | POA: Diagnosis not present

## 2020-09-09 DIAGNOSIS — K219 Gastro-esophageal reflux disease without esophagitis: Secondary | ICD-10-CM | POA: Diagnosis not present

## 2020-09-09 DIAGNOSIS — E039 Hypothyroidism, unspecified: Secondary | ICD-10-CM | POA: Diagnosis not present

## 2020-09-09 DIAGNOSIS — I456 Pre-excitation syndrome: Secondary | ICD-10-CM | POA: Diagnosis not present

## 2020-09-09 DIAGNOSIS — Z4789 Encounter for other orthopedic aftercare: Secondary | ICD-10-CM | POA: Diagnosis not present

## 2020-09-09 DIAGNOSIS — Z981 Arthrodesis status: Secondary | ICD-10-CM | POA: Diagnosis not present

## 2020-10-17 DIAGNOSIS — Z961 Presence of intraocular lens: Secondary | ICD-10-CM | POA: Diagnosis not present

## 2020-10-17 DIAGNOSIS — H04122 Dry eye syndrome of left lacrimal gland: Secondary | ICD-10-CM | POA: Diagnosis not present

## 2020-10-17 DIAGNOSIS — H524 Presbyopia: Secondary | ICD-10-CM | POA: Diagnosis not present

## 2020-10-23 DIAGNOSIS — R0781 Pleurodynia: Secondary | ICD-10-CM | POA: Diagnosis not present

## 2020-10-23 DIAGNOSIS — E039 Hypothyroidism, unspecified: Secondary | ICD-10-CM | POA: Diagnosis not present

## 2020-10-23 DIAGNOSIS — Z6825 Body mass index (BMI) 25.0-25.9, adult: Secondary | ICD-10-CM | POA: Diagnosis not present

## 2020-10-24 DIAGNOSIS — M5414 Radiculopathy, thoracic region: Secondary | ICD-10-CM | POA: Diagnosis not present

## 2020-11-07 DIAGNOSIS — Z6823 Body mass index (BMI) 23.0-23.9, adult: Secondary | ICD-10-CM | POA: Diagnosis not present

## 2020-11-07 DIAGNOSIS — E039 Hypothyroidism, unspecified: Secondary | ICD-10-CM | POA: Diagnosis not present

## 2020-11-07 DIAGNOSIS — R0781 Pleurodynia: Secondary | ICD-10-CM | POA: Diagnosis not present

## 2020-11-07 DIAGNOSIS — G2581 Restless legs syndrome: Secondary | ICD-10-CM | POA: Diagnosis not present

## 2020-11-07 DIAGNOSIS — S76311A Strain of muscle, fascia and tendon of the posterior muscle group at thigh level, right thigh, initial encounter: Secondary | ICD-10-CM | POA: Diagnosis not present

## 2020-11-20 ENCOUNTER — Ambulatory Visit: Payer: Medicare HMO | Admitting: Cardiology

## 2020-11-20 ENCOUNTER — Encounter: Payer: Self-pay | Admitting: Cardiology

## 2020-11-20 ENCOUNTER — Other Ambulatory Visit: Payer: Self-pay

## 2020-11-20 VITALS — BP 134/86 | HR 74 | Ht 63.0 in | Wt 152.4 lb

## 2020-11-20 DIAGNOSIS — I493 Ventricular premature depolarization: Secondary | ICD-10-CM

## 2020-11-20 DIAGNOSIS — E782 Mixed hyperlipidemia: Secondary | ICD-10-CM | POA: Diagnosis not present

## 2020-11-20 DIAGNOSIS — I456 Pre-excitation syndrome: Secondary | ICD-10-CM | POA: Diagnosis not present

## 2020-11-20 NOTE — Progress Notes (Signed)
Cardiology Office Note:    Date:  11/20/2020   ID:  Laura Singleton, DOB 22-Oct-1945, MRN [id number]  PCP:  [name], MD  Cardiologist:  Jenean Lindau, MD   Referring MD: Penelope Coop, FNP    ASSESSMENT:    1. WPW (Wolff-Parkinson-White syndrome)   2. Mixed hyperlipidemia   3. PVC's (premature ventricular contractions)    PLAN:    In order of problems listed above:  Primary prevention stressed with patient.  Importance of compliance with diet medication stressed and she vocalized understanding. Frequent PVCs: Stable at this time.  Electrophysiology notes by our colleagues were reviewed by me and discussed with the patient.  A stress test and echocardiogram done in the past year were unremarkable.  She will continue flecainide.  EKG done today reveals sinus rhythm and QRS was unremarkable.   Mixed dyslipidemia: Diet was emphasized.  Lifestyle modification urged and she vocalized understanding and promises to comply. Patient will be seen in follow-up appointment in 6 months or earlier if the patient has any concerns    Medication Adjustments/Labs and Tests Ordered: Current medicines are reviewed at length with the patient today.  Concerns regarding medicines are outlined above.  Orders Placed This Encounter  Procedures   EKG 12-Lead   No orders of the defined types were placed in this encounter.    No chief complaint on file.    History of Present Illness:    Laura Singleton is a 75 y.o. female.  Patient has past medical history of frequent PVCs for which she is on flecainide, essential hypertension and mixed dyslipidemia.  She denies any problems at this time and takes care of activities of daily living.  No chest pain orthopnea or PND.  At the time of my evaluation, the patient is alert awake oriented and in no distress.  Past Medical History:  Diagnosis Date   Arthritis    Body mass index (BMI) 26.0-26.9, adult 06/20/2020   Cardiac arrhythmia 03/28/2020   Cardiac  murmur 05/29/2020   Carpal tunnel syndrome 06/07/2018   Cervical myelopathy ([place]) 11/05/2016   Cervical radiculopathy 10/06/2016   Chronic pain of left knee 11/17/2018   Depression    Dysrhythmia    Encounter for monitoring flecainide therapy 05/29/2020   GERD (gastroesophageal reflux disease)    History of total knee arthroplasty 12/26/2019   Hyperlipidemia 11/05/2016   Hypothyroidism    Myelopathy of cervical spinal cord with cervical radiculopathy (HCC) 11/05/2016   Neuropathy 05/31/2018   Oropharyngeal dysphagia 12/01/2016   Preoperative cardiovascular examination 11/21/2019   PVC (premature ventricular contraction) 11/05/2016   PVC's (premature ventricular contractions)    S/P total knee arthroplasty, left 12/26/2019   Skin cancer of face    Spinal stenosis 07/17/2020   Spinal stenosis in cervical region 10/06/2016   Ulnar neuropathy of both upper extremities 06/20/2020   Ventricular premature beats 11/05/2016   Wolff-Parkinson-White (WPW) pattern 11/05/2016   Wolff-Parkinson-White (WPW) syndrome    WPW (Wolff-Parkinson-White syndrome) 11/05/2016    Past Surgical History:  Procedure Laterality Date   ANTERIOR CERVICAL DECOMP/DISCECTOMY FUSION N/A 11/05/2016   Procedure: ANTERIOR CERVICAL DECOMPRESSION/DISCECTOMY FUSION  - CERVICAL THREE-FOUR, CERVICAL FIVE-SIX, CERVICAL SIX -SEVEN;  Surgeon: Kary Kos, MD;  Location: [facility];  Service: Neurosurgery;  Laterality: N/A;   COLONOSCOPY     EYE SURGERY     both eyes lens implants   LUMBAR LAMINECTOMY/DECOMPRESSION MICRODISCECTOMY Bilateral 07/17/2020   Procedure: Laminectomy and Foraminotomy - bilateral - Thoracic ten-Thoracic eleven - Lumbar one-Lumbar  two;  Surgeon: Kary Kos, MD;  Location: [facility];  Service: Neurosurgery;  Laterality: Bilateral;   SKIN CANCER EXCISION     Face   TONSILLECTOMY     TOTAL KNEE ARTHROPLASTY Left 12/26/2019   Procedure: TOTAL KNEE ARTHROPLASTY;  Surgeon: Paralee Cancel, MD;  Location: [facility];  Service: Orthopedics;   Laterality: Left;  70 mins   TUBAL LIGATION      Current Medications: Current Meds  Medication Sig   atorvastatin (LIPITOR) 20 MG tablet Take 20 mg by mouth at bedtime.   calcium carbonate (OS-CAL) 600 MG TABS tablet Take 600 mg by mouth 2 (two) times daily with a meal.   cyclobenzaprine (FLEXERIL) 10 MG tablet Take 10 mg by mouth 3 (three) times daily as needed for muscle spasms.   flecainide (TAMBOCOR) 100 MG tablet Take 100 mg by mouth 2 (two) times daily.   fluconazole (DIFLUCAN) 200 MG tablet Take 200 mg by mouth once a week.   levothyroxine (SYNTHROID) 112 MCG tablet Take 112 mcg by mouth daily before breakfast.   Multiple Vitamin (MULTIVITAMIN WITH MINERALS) TABS tablet Take 1 tablet by mouth daily. Centrum Silver   omeprazole (PRILOSEC) 40 MG capsule Take 40 mg by mouth daily.   polyethylene glycol (MIRALAX / GLYCOLAX) 17 g packet Take 17 g by mouth as needed for mild constipation.   rOPINIRole (REQUIP) 1 MG tablet Take 5 mg by mouth at bedtime.   sertraline (ZOLOFT) 100 MG tablet Take 200 mg by mouth daily.   traZODone (DESYREL) 100 MG tablet Take 100 mg by mouth at bedtime.     Allergies:   Bee venom, Parafon forte dsc [chlorzoxazone], and Sulfa antibiotics   Social History   Socioeconomic History   Marital status: Widowed    Spouse name: Not on file   Number of children: Not on file   Years of education: Not on file   Highest education level: Not on file  Occupational History   Not on file  Tobacco Use   Smoking status: Former    Types: Cigarettes   Smokeless tobacco: Never   Tobacco comments:    Quit 40 years ago  Vaping Use   Vaping Use: Never used  Substance and Sexual Activity   Alcohol use: No   Drug use: No   Sexual activity: Not on file  Other Topics Concern   Not on file  Social History Narrative   Not on file   Social Determinants of Health   Financial Resource Strain: Not on file  Food Insecurity: Not on file  Transportation Needs: Not on  file  Physical Activity: Not on file  Stress: Not on file  Social Connections: Not on file     Family History: The patient's family history includes High Cholesterol in her mother; High blood pressure in her brother and mother.  ROS:   Please see the history of present illness.    All other systems reviewed and are negative.  EKGs/Labs/Other Studies Reviewed:    The following studies were reviewed today: IMPRESSIONS     1. Left ventricular ejection fraction, by estimation, is 60 to 65%. The  left ventricle has normal function. The left ventricle has no regional  wall motion abnormalities. There is mild left ventricular hypertrophy.  Left ventricular diastolic parameters  are consistent with Grade I diastolic dysfunction (impaired relaxation).   2. Right ventricular systolic function is normal. The right ventricular  size is normal. There is normal pulmonary artery systolic pressure.  3. The mitral valve is normal in structure. No evidence of mitral valve  regurgitation. No evidence of mitral stenosis.   4. The aortic valve is normal in structure. Aortic valve regurgitation is  not visualized. No aortic stenosis is present.   5. The inferior vena cava is normal in size with greater than 50%    Recent Labs: 07/15/2020: BUN 17; Creatinine, Ser 1.11; Hemoglobin 12.3; Platelets 322; Potassium 4.7; Sodium 142  Recent Lipid Panel No results found for: CHOL, TRIG, HDL, CHOLHDL, VLDL, LDLCALC, LDLDIRECT  Physical Exam:    VS:  BP 134/86   Pulse 74   Ht '5\' 3"'$  (1.6 m)   Wt 152 lb 6.4 oz (69.1 kg)   SpO2 97%   BMI 27.00 kg/m     Wt Readings from Last 3 Encounters:  11/20/20 152 lb 6.4 oz (69.1 kg)  08/26/20 155 lb (70.3 kg)  07/17/20 157 lb (71.2 kg)     GEN: Patient is in no acute distress HEENT: Normal NECK: No JVD; No carotid bruits LYMPHATICS: No lymphadenopathy CARDIAC: Hear sounds regular, 2/6 systolic murmur at the apex. RESPIRATORY:  Clear to auscultation without  rales, wheezing or rhonchi  ABDOMEN: Soft, non-tender, non-distended MUSCULOSKELETAL:  No edema; No deformity  SKIN: Warm and dry NEUROLOGIC:  Alert and oriented x 3 PSYCHIATRIC:  Normal affect   Signed, Jenean Lindau, MD  11/20/2020 11:31 AM    [place]

## 2020-11-20 NOTE — Patient Instructions (Signed)

## 2020-12-16 DIAGNOSIS — M1711 Unilateral primary osteoarthritis, right knee: Secondary | ICD-10-CM | POA: Diagnosis not present

## 2020-12-16 DIAGNOSIS — Z96652 Presence of left artificial knee joint: Secondary | ICD-10-CM | POA: Diagnosis not present

## 2020-12-17 ENCOUNTER — Telehealth: Payer: Self-pay

## 2020-12-17 NOTE — Telephone Encounter (Signed)
   [facility] Pre-operative Risk Assessment    Patient Name: Laura Singleton  DOB: 1945-12-11 MRN: [id number]  HEARTCARE STAFF:  - IMPORTANT!!!!!! Under Visit Info/Reason for Call, type in Other and utilize the format Clearance MM/DD/YY or Clearance TBD. Do not use dashes or single digits. - Please review there is not already an duplicate clearance open for this procedure. - If request is for dental extraction, please clarify the # of teeth to be extracted. - If the patient is currently at the dentist's office, call Pre-Op Callback Staff (MA/nurse) to input urgent request.  - If the patient is not currently in the dentist office, please route to the Pre-Op pool.  Request for surgical clearance:  What type of surgery is being performed? Right total knee arthroplasty  When is this surgery scheduled? 02/11/21  What type of clearance is required (medical clearance vs. Pharmacy clearance to hold med vs. Both)? medical  Are there any medications that need to be held prior to surgery and how long? No  Practice name and name of physician performing surgery? [facility] Dr. Paralee Cancel   What is the office phone number? [phone]   7.   What is the office fax number? [phone]  8.   Anesthesia type (None, local, MAC, general) ? spinal   Truddie Hidden 12/17/2020, 11:10 AM  _________________________________________________________________   (provider comments below)

## 2020-12-18 NOTE — Telephone Encounter (Signed)
   Name: Laura Singleton  DOB: 1945-06-08  MRN: [id number]   Primary Cardiologist: None  Chart reviewed as part of pre-operative protocol coverage. Patient was contacted 12/18/2020 in reference to pre-operative risk assessment for pending surgery as outlined below.  Laura Singleton was last seen on 11/20/2020 by Dr. Geraldo Pitter.  Since that day, Laura Singleton has done well without any exertional chest pain or worsening dyspnea.  Despite knee issue, she is able to accomplish more than 4 METS of activity by walk a mile.  She had a normal echocardiogram in April 2022 and last Myoview obtained on 12/05/2019 was also normal.  Therefore, based on ACC/AHA guidelines, the patient would be at acceptable risk for the planned procedure without further cardiovascular testing.   The patient was advised that if she develops new symptoms prior to surgery to contact our office to arrange for a follow-up visit, and she verbalized understanding.  I will route this recommendation to the requesting party via Epic fax function and remove from pre-op pool. Please call with questions.  [place], [place] 12/18/2020, 2:37 PM

## 2021-01-07 DEATH — deceased

## 2021-01-27 NOTE — Patient Instructions (Addendum)
DUE TO COVID-19 ONLY ONE VISITOR IS ALLOWED TO COME WITH YOU AND STAY IN THE WAITING ROOM ONLY DURING PRE OP AND PROCEDURE.   **NO VISITORS ARE ALLOWED IN THE SHORT STAY AREA OR RECOVERY ROOM!!**  IF YOU WILL BE ADMITTED INTO THE HOSPITAL YOU ARE ALLOWED ONLY TWO SUPPORT PEOPLE DURING VISITATION HOURS ONLY (7AM -8PM)   The support person(s) may change daily. The support person(s) must pass our screening, gel in and out, and wear a mask at all times, including in the patient's room. Patients must also wear a mask when staff or their support person are in the room.  No visitors under the age of 20. Any visitor under the age of 65 must be accompanied by an adult.    COVID SWAB TESTING MUST BE COMPLETED ON:  02/07/21 **MUST PRESENT COMPLETED FORM AT TESTING SITE**    [facility] [place] (backside of the building) Open 8am-3pm. No appointment needed. You are not required to quarantine, however you are required to wear a well-fitted mask when you are out and around people not in your household.  Hand Hygiene often Do NOT share personal items Notify your provider if you are in close contact with someone who has COVID or you develop fever 100.4 or greater, new onset of sneezing, cough, sore throat, shortness of breath or body aches.   Your procedure is scheduled on: 02/11/21   Report to [facility] Main Entrance    Report to admitting at 7:20 AM   Call this number if you have problems the morning of surgery [phone]   Do not eat food :After Midnight.   May have liquids until 7:00 AM day of surgery  CLEAR LIQUID DIET  Foods Allowed                                                                     Foods Excluded  Water, Black Coffee and tea (no milk or creamer)           liquids that you cannot  Plain Jell-O in any flavor  (No red)                                    see through such as: Fruit ices (not with fruit pulp)                                             milk, soups, orange juice              Iced Popsicles (No red)                                                All solid food                                   Apple juices Sports  drinks like Gatorade (No red) Lightly seasoned clear broth or consume(fat free) Sugar.     The day of surgery:  Drink ONE (1) Pre-Surgery Clear Ensure by 7:00 am the morning of surgery. Drink in one sitting. Do not sip.  This drink was given to you during your hospital  pre-op appointment visit. Nothing else to drink after completing the  Pre-Surgery Clear Ensure.          If you have questions, please contact your surgeon's office.     Oral Hygiene is also important to reduce your risk of infection.                                    Remember - BRUSH YOUR TEETH THE MORNING OF SURGERY WITH YOUR REGULAR TOOTHPASTE    Take these medicines the morning of surgery with A SIP OF WATER: Tambocor, Synthroid, Omeprazole, Requip, Zoloft                              You may not have any metal on your body including hair pins, jewelry, and body piercing             Do not wear make-up, lotions, powders, perfumes, or deodorant  Do not wear nail polish including gel and S&S, artificial/acrylic nails, or any other type of covering on natural nails including finger and toenails. If you have artificial nails, gel coating, etc. that needs to be removed by a nail salon please have this removed prior to surgery or surgery may need to be canceled/ delayed if the surgeon/ anesthesia feels like they are unable to be safely monitored.   Do not shave  48 hours prior to surgery.    Do not bring valuables to the hospital. [place].   Bring small overnight bag day of surgery.    Special Instructions: Bring a copy of your healthcare power of attorney and living will documents         the day of surgery if you haven't scanned them before.              Please read over the following  fact sheets you were given: IF YOU HAVE QUESTIONS ABOUT YOUR PRE-OP INSTRUCTIONS PLEASE CALL [facility] - Preparing for Surgery Before surgery, you can play an important role.  Because skin is not sterile, your skin needs to be as free of germs as possible.  You can reduce the number of germs on your skin by washing with CHG (chlorahexidine gluconate) soap before surgery.  CHG is an antiseptic cleaner which kills germs and bonds with the skin to continue killing germs even after washing. Please DO NOT use if you have an allergy to CHG or antibacterial soaps.  If your skin becomes reddened/irritated stop using the CHG and inform your nurse when you arrive at Short Stay. Do not shave (including legs and underarms) for at least 48 hours prior to the first CHG shower.  You may shave your face/neck.  Please follow these instructions carefully:  1.  Shower with CHG Soap the night before surgery and the  morning of surgery.  2.  If you choose to wash your hair, wash your hair first as usual with your  normal  shampoo.  3.  After you shampoo, rinse your hair and body thoroughly to remove the shampoo.                             4.  Use CHG as you would any other liquid soap.  You can apply chg directly to the skin and wash.  Gently with a scrungie or clean washcloth.  5.  Apply the CHG Soap to your body ONLY FROM THE NECK DOWN.   Do   not use on face/ open                           Wound or open sores. Avoid contact with eyes, ears mouth and   genitals (private parts).                       Wash face,  Genitals (private parts) with your normal soap.             6.  Wash thoroughly, paying special attention to the area where your    surgery  will be performed.  7.  Thoroughly rinse your body with warm water from the neck down.  8.  DO NOT shower/wash with your normal soap after using and rinsing off the CHG Soap.                9.  Pat yourself dry with a clean towel.            10.   Wear clean pajamas.            11.  Place clean sheets on your bed the night of your first shower and do not  sleep with pets. Day of Surgery : Do not apply any lotions/deodorants the morning of surgery.  Please wear clean clothes to the hospital/surgery center.  FAILURE TO FOLLOW THESE INSTRUCTIONS MAY RESULT IN THE CANCELLATION OF YOUR SURGERY  PATIENT SIGNATURE_________________________________  NURSE SIGNATURE__________________________________  ________________________________________________________________________   Laura Singleton  An incentive spirometer is a tool that can help keep your lungs clear and active. This tool measures how well you are filling your lungs with each breath. Taking long deep breaths may help reverse or decrease the chance of developing breathing (pulmonary) problems (especially infection) following: A long period of time when you are unable to move or be active. BEFORE THE PROCEDURE  If the spirometer includes an indicator to show your best effort, your nurse or respiratory therapist will set it to a desired goal. If possible, sit up straight or lean slightly forward. Try not to slouch. Hold the incentive spirometer in an upright position. INSTRUCTIONS FOR USE  Sit on the edge of your bed if possible, or sit up as far as you can in bed or on a chair. Hold the incentive spirometer in an upright position. Breathe out normally. Place the mouthpiece in your mouth and seal your lips tightly around it. Breathe in slowly and as deeply as possible, raising the piston or the ball toward the top of the column. Hold your breath for 3-5 seconds or for as long as possible. Allow the piston or ball to fall to the bottom of the column. Remove the mouthpiece from your mouth and breathe out normally. Rest for a few seconds and repeat Steps 1 through 7 at least 10 times every 1-2 hours when you are awake. Take  your time and take a few normal breaths between deep  breaths. The spirometer may include an indicator to show your best effort. Use the indicator as a goal to work toward during each repetition. After each set of 10 deep breaths, practice coughing to be sure your lungs are clear. If you have an incision (the cut made at the time of surgery), support your incision when coughing by placing a pillow or rolled up towels firmly against it. Once you are able to get out of bed, walk around indoors and cough well. You may stop using the incentive spirometer when instructed by your caregiver.  RISKS AND COMPLICATIONS Take your time so you do not get dizzy or light-headed. If you are in pain, you may need to take or ask for pain medication before doing incentive spirometry. It is harder to take a deep breath if you are having pain. AFTER USE Rest and breathe slowly and easily. It can be helpful to keep track of a log of your progress. Your caregiver can provide you with a simple table to help with this. If you are using the spirometer at home, follow these instructions: [place] IF:  You are having difficultly using the spirometer. You have trouble using the spirometer as often as instructed. Your pain medication is not giving enough relief while using the spirometer. You develop fever of 100.5 F (38.1 C) or higher. SEEK IMMEDIATE MEDICAL CARE IF:  You cough up bloody sputum that had not been present before. You develop fever of 102 F (38.9 C) or greater. You develop worsening pain at or near the incision site. MAKE SURE YOU:  Understand these instructions. Will watch your condition. Will get help right away if you are not doing well or get worse. Document Released: 07/06/2006 Document Revised: 05/18/2011 Document Reviewed: 09/06/2006 Exit[employer] Patient Information 2014 Exit[employer], [place].   ________________________________________________________________________    WHAT IS A BLOOD TRANSFUSION? Blood Transfusion Information  A  transfusion is the replacement of blood or some of its parts. Blood is made up of multiple cells which provide different functions. Red blood cells carry oxygen and are used for blood loss replacement. White blood cells fight against infection. Platelets control bleeding. Plasma helps clot blood. Other blood products are available for specialized needs, such as hemophilia or other clotting disorders. BEFORE THE TRANSFUSION  Who gives blood for transfusions?  Healthy volunteers who are fully evaluated to make sure their blood is safe. This is blood bank blood. Transfusion therapy is the safest it has ever been in the practice of medicine. Before blood is taken from a donor, a complete history is taken to make sure that person has no history of diseases nor engages in risky social behavior (examples are intravenous drug use or sexual activity with multiple partners). The donor's travel history is screened to minimize risk of transmitting infections, such as malaria. The donated blood is tested for signs of infectious diseases, such as HIV and hepatitis. The blood is then tested to be sure it is compatible with you in order to minimize the chance of a transfusion reaction. If you or a relative donates blood, this is often done in anticipation of surgery and is not appropriate for emergency situations. It takes many days to process the donated blood. RISKS AND COMPLICATIONS Although transfusion therapy is very safe and saves many lives, the main dangers of transfusion include:  Getting an infectious disease. Developing a transfusion reaction. This is an allergic reaction to something in  the blood you were given. Every precaution is taken to prevent this. The decision to have a blood transfusion has been considered carefully by your caregiver before blood is given. Blood is not given unless the benefits outweigh the risks. AFTER THE TRANSFUSION Right after receiving a blood transfusion, you will usually  feel much better and more energetic. This is especially true if your red blood cells have gotten low (anemic). The transfusion raises the level of the red blood cells which carry oxygen, and this usually causes an energy increase. The nurse administering the transfusion will monitor you carefully for complications. HOME CARE INSTRUCTIONS  No special instructions are needed after a transfusion. You may find your energy is better. Speak with your caregiver about any limitations on activity for underlying diseases you may have. SEEK MEDICAL CARE IF:  Your condition is not improving after your transfusion. You develop redness or irritation at the intravenous (IV) site. SEEK IMMEDIATE MEDICAL CARE IF:  Any of the following symptoms occur over the next 12 hours: Shaking chills. You have a temperature by mouth above 102 F (38.9 C), not controlled by medicine. Chest, back, or muscle pain. People around you feel you are not acting correctly or are confused. Shortness of breath or difficulty breathing. Dizziness and fainting. You get a rash or develop hives. You have a decrease in urine output. Your urine turns a dark color or changes to pink, red, or brown. Any of the following symptoms occur over the next 10 days: You have a temperature by mouth above 102 F (38.9 C), not controlled by medicine. Shortness of breath. Weakness after normal activity. The white part of the eye turns yellow (jaundice). You have a decrease in the amount of urine or are urinating less often. Your urine turns a dark color or changes to pink, red, or brown. Document Released: 02/21/2000 Document Revised: 05/18/2011 Document Reviewed: 10/10/2007 [facility] Patient Information 2014 [place], [place].  _______________________________________________________________________

## 2021-01-27 NOTE — Progress Notes (Addendum)
COVID swab appointment: 02/07/21  COVID Vaccine Completed: no Date COVID Vaccine completed: Has received booster: COVID vaccine manufacturer: [place]   Date of COVID positive in last 90 days: no  PCP - [name], MD Cardiologist - [name], MD Electrophysiologist- [name], MD  Cardiac Clearance by Almyra Deforest 03/20/20 In Epic  Chest x-ray - 04/03/20 Care Everywhere EKG - 11/20/20 Epic Stress Test - 12/05/19 Epic ECHO - 06/19/20 Epic Cardiac Cath - n/a Pacemaker/ICD device last checked: n/a Spinal Cord Stimulator: n/a  Sleep Study - n/a CPAP -   Fasting Blood Sugar - n/a Checks Blood Sugar _____ times a day  Blood Thinner Instructions: n/a Aspirin Instructions: Last Dose:  Activity level: Can go up a flight of stairs and perform activities of daily living without stopping and without symptoms of chest pain or shortness of breath.    Anesthesia review: PVCs, WPW syndrome  Patient denies shortness of breath, fever, cough and chest pain at PAT appointment   Patient verbalized understanding of instructions that were given to them at the PAT appointment. Patient was also instructed that they will need to review over the PAT instructions again at home before surgery.

## 2021-01-29 ENCOUNTER — Other Ambulatory Visit: Payer: Self-pay

## 2021-01-29 ENCOUNTER — Encounter (HOSPITAL_COMMUNITY): Payer: Self-pay

## 2021-01-29 ENCOUNTER — Encounter (HOSPITAL_COMMUNITY)
Admission: RE | Admit: 2021-01-29 | Discharge: 2021-01-29 | Disposition: A | Discharge status: Home / Self Care | Payer: Medicare HMO | Source: Ambulatory Visit | Attending: Orthopedic Surgery | Admitting: Orthopedic Surgery

## 2021-01-29 VITALS — BP 145/72 | HR 69 | Temp 97.8°F | Resp 14 | Ht 62.5 in | Wt 153.0 lb

## 2021-01-29 DIAGNOSIS — E039 Hypothyroidism, unspecified: Secondary | ICD-10-CM | POA: Insufficient documentation

## 2021-01-29 DIAGNOSIS — M1711 Unilateral primary osteoarthritis, right knee: Secondary | ICD-10-CM | POA: Insufficient documentation

## 2021-01-29 DIAGNOSIS — I456 Pre-excitation syndrome: Secondary | ICD-10-CM | POA: Insufficient documentation

## 2021-01-29 DIAGNOSIS — I493 Ventricular premature depolarization: Secondary | ICD-10-CM | POA: Insufficient documentation

## 2021-01-29 DIAGNOSIS — Z01818 Encounter for other preprocedural examination: Secondary | ICD-10-CM

## 2021-01-29 DIAGNOSIS — Z01812 Encounter for preprocedural laboratory examination: Secondary | ICD-10-CM | POA: Insufficient documentation

## 2021-01-29 DIAGNOSIS — K219 Gastro-esophageal reflux disease without esophagitis: Secondary | ICD-10-CM | POA: Diagnosis not present

## 2021-01-29 HISTORY — DX: Unilateral primary osteoarthritis, right knee: M17.11

## 2021-01-29 LAB — TYPE AND SCREEN
ABO/RH(D): A POS
Antibody Screen: NEGATIVE

## 2021-01-29 LAB — COMPREHENSIVE METABOLIC PANEL
ALT: 19 U/L (ref 0–44)
AST: 25 U/L (ref 15–41)
Albumin: 4.1 g/dL (ref 3.5–5.0)
Alkaline Phosphatase: 48 U/L (ref 38–126)
Anion gap: 6 (ref 5–15)
BUN: 16 mg/dL (ref 8–23)
CO2: 30 mmol/L (ref 22–32)
Calcium: 9.7 mg/dL (ref 8.9–10.3)
Chloride: 106 mmol/L (ref 98–111)
Creatinine, Ser: 0.81 mg/dL (ref 0.44–1.00)
GFR, Estimated: >60 mL/min (ref >60)
Glucose, Bld: 118 mg/dL — ABNORMAL HIGH (ref 70–99)
Potassium: 4.4 mmol/L (ref 3.5–5.1)
Sodium: 142 mmol/L (ref 135–145)
Total Bilirubin: 0.4 mg/dL (ref 0.3–1.2)
Total Protein: 6.7 g/dL (ref 6.5–8.1)

## 2021-01-29 LAB — CBC
HCT: 37.9 % (ref 36.0–46.0)
Hemoglobin: 12.1 g/dL (ref 12.0–15.0)
MCH: 29.2 pg (ref 26.0–34.0)
MCHC: 31.9 g/dL (ref 30.0–36.0)
MCV: 91.5 fL (ref 80.0–100.0)
Platelets: 335 10*3/uL (ref 150–400)
RBC: 4.14 MIL/uL (ref 3.87–5.11)
RDW: 14.7 % (ref 11.5–15.5)
WBC: 6.8 10*3/uL (ref 4.0–10.5)
nRBC: 0 % (ref 0.0–0.2)

## 2021-01-29 LAB — SURGICAL PCR SCREEN
MRSA, PCR: NEGATIVE
Staphylococcus aureus: NEGATIVE

## 2021-02-04 NOTE — Progress Notes (Signed)
Anesthesia Chart Review   Case: [id number] Date/Time: 02/11/21 0950   Procedure: TOTAL KNEE ARTHROPLASTY (Right: Knee)   Anesthesia type: Spinal   Pre-op diagnosis: Right knee osteoarthritis   Location: [facility] ROOM 09 / [facility]   Surgeons: Paralee Cancel, MD       DISCUSSION:75 y.o. former smoker with h/o GERD, hypothyroidism, PVC, WPW, right knee OA scheduled for above procedure 02/11/2021 with Dr. Paralee Cancel.   Per cardiology preoperative evaluation 12/18/2020, "Chart reviewed as part of pre-operative protocol coverage. Patient was contacted 12/18/2020 in reference to pre-operative risk assessment for pending surgery as outlined below.  Leocadia Cirigliano was last seen on 11/20/2020 by Dr. Geraldo Pitter.  Since that day, Shelbee Apgar has done well without any exertional chest pain or worsening dyspnea.  Despite knee issue, she is able to accomplish more than 4 METS of activity by walk a mile.  She had a normal echocardiogram in April 2022 and last Myoview obtained on 12/05/2019 was also normal.   Therefore, based on ACC/AHA guidelines, the patient would be at acceptable risk for the planned procedure without further cardiovascular testing"  S/p C3-4, C5-7 ACDF 11/05/16.   Anticipate pt can proceed with planned procedure barring acute status change.   VS: BP (!) 145/72   Pulse 69   Temp 36.6 C (Oral)   Resp 14   Ht 5' 2.5" (1.588 m)   Wt 69.4 kg   SpO2 100%   BMI 27.54 kg/m   PROVIDERS: [name], MD is PCP   [name], MD is Cardiologist LABS: Labs reviewed: Acceptable for surgery. (all labs ordered are listed, but only abnormal results are displayed)  Labs Reviewed  COMPREHENSIVE METABOLIC PANEL - Abnormal; Notable for the following components:      Result Value   Glucose, Bld 118 (*)    All other components within normal limits  SURGICAL PCR SCREEN  CBC  TYPE AND SCREEN     IMAGES:   EKG: 11/20/2020 Rate 74 bpm  NSR Rightward axis Low voltage QRS T wave abnormality,  consider inferior ischemia   CV: Echo 06/19/2020  1. Left ventricular ejection fraction, by estimation, is 60 to 65%. The  left ventricle has normal function. The left ventricle has no regional  wall motion abnormalities. There is mild left ventricular hypertrophy.  Left ventricular diastolic parameters  are consistent with Grade I diastolic dysfunction (impaired relaxation).   2. Right ventricular systolic function is normal. The right ventricular  size is normal. There is normal pulmonary artery systolic pressure.   3. The mitral valve is normal in structure. No evidence of mitral valve  regurgitation. No evidence of mitral stenosis.   4. The aortic valve is normal in structure. Aortic valve regurgitation is  not visualized. No aortic stenosis is present.   5. The inferior vena cava is normal in size with greater than 50%  respiratory variability, suggesting right atrial pressure of 3 mmHg.   Stress Test 12/05/2019 The left ventricular ejection fraction is normal (55-65%). Nuclear stress EF: 64%. There was no ST segment deviation noted during stress. No T wave inversion was noted during stress. The study is normal. This is a low risk study. Past Medical History:  Diagnosis Date   Arthritis    Body mass index (BMI) 26.0-26.9, adult 06/20/2020   Cardiac arrhythmia 03/28/2020   Cardiac murmur 05/29/2020   Carpal tunnel syndrome 06/07/2018   Cervical myelopathy ([place]) 11/05/2016   Cervical radiculopathy 10/06/2016   Chronic pain of left knee 11/17/2018  Depression    Dysrhythmia    Encounter for monitoring flecainide therapy 05/29/2020   GERD (gastroesophageal reflux disease)    History of total knee arthroplasty 12/26/2019   Hyperlipidemia 11/05/2016   Hypothyroidism    Myelopathy of cervical spinal cord with cervical radiculopathy (HCC) 11/05/2016   Neuropathy 05/31/2018   Oropharyngeal dysphagia 12/01/2016   Preoperative cardiovascular examination 11/21/2019   PVC (premature  ventricular contraction) 11/05/2016   PVC's (premature ventricular contractions)    S/P total knee arthroplasty, left 12/26/2019   Skin cancer of face    Spinal stenosis 07/17/2020   Spinal stenosis in cervical region 10/06/2016   Ulnar neuropathy of both upper extremities 06/20/2020   Ventricular premature beats 11/05/2016   Wolff-Parkinson-White (WPW) pattern 11/05/2016   Wolff-Parkinson-White (WPW) syndrome    WPW (Wolff-Parkinson-White syndrome) 11/05/2016    Past Surgical History:  Procedure Laterality Date   ANTERIOR CERVICAL DECOMP/DISCECTOMY FUSION N/A 11/05/2016   Procedure: ANTERIOR CERVICAL DECOMPRESSION/DISCECTOMY FUSION  - CERVICAL THREE-FOUR, CERVICAL FIVE-SIX, CERVICAL SIX -SEVEN;  Surgeon: Kary Kos, MD;  Location: [facility];  Service: Neurosurgery;  Laterality: N/A;   COLONOSCOPY     EYE SURGERY     both eyes lens implants   LUMBAR LAMINECTOMY/DECOMPRESSION MICRODISCECTOMY Bilateral 07/17/2020   Procedure: Laminectomy and Foraminotomy - bilateral - Thoracic ten-Thoracic eleven - Lumbar one-Lumbar two;  Surgeon: Kary Kos, MD;  Location: [facility];  Service: Neurosurgery;  Laterality: Bilateral;   SKIN CANCER EXCISION     Face   thumb surgery Right    TONSILLECTOMY     TOTAL KNEE ARTHROPLASTY Left 12/26/2019   Procedure: TOTAL KNEE ARTHROPLASTY;  Surgeon: Paralee Cancel, MD;  [facility];  Service: Orthopedics;  Laterality: Left;  70 mins   TUBAL LIGATION      MEDICATIONS:  atorvastatin (LIPITOR) 20 MG tablet   calcium carbonate (OS-CAL) 600 MG TABS tablet   cyclobenzaprine (FLEXERIL) 10 MG tablet   flecainide (TAMBOCOR) 100 MG tablet   levothyroxine (SYNTHROID) 112 MCG tablet   Multiple Vitamin (MULTIVITAMIN WITH MINERALS) TABS tablet   omeprazole (PRILOSEC) 40 MG capsule   ropinirole (REQUIP) 5 MG tablet   sertraline (ZOLOFT) 100 MG tablet   traZODone (DESYREL) 100 MG tablet   TURMERIC CURCUMIN PO   No current facility-administered medications for this encounter.      Konrad Felix Ward, PA-C [facility]-Surgical Testing [phone]

## 2021-02-04 NOTE — Anesthesia Preprocedure Evaluation (Deleted)
Anesthesia Evaluation    Airway        Dental   Pulmonary former smoker,           Cardiovascular      Neuro/Psych    GI/Hepatic   Endo/Other    Renal/GU      Musculoskeletal   Abdominal   Peds  Hematology   Anesthesia Other Findings   Reproductive/Obstetrics                             Anesthesia Physical Anesthesia Plan  ASA:   Anesthesia Plan:    Post-op Pain Management:    Induction:   PONV Risk Score and Plan:   Airway Management Planned:   Additional Equipment:   Intra-op Plan:   Post-operative Plan:   Informed Consent:   Plan Discussed with:   Anesthesia Plan Comments: (See PAT note 01/29/21, Konrad Felix Ward, PA-C)        Anesthesia Quick Evaluation

## 2021-02-06 DIAGNOSIS — R32 Unspecified urinary incontinence: Secondary | ICD-10-CM | POA: Diagnosis not present

## 2021-02-06 DIAGNOSIS — K219 Gastro-esophageal reflux disease without esophagitis: Secondary | ICD-10-CM | POA: Diagnosis not present

## 2021-02-06 DIAGNOSIS — Z6823 Body mass index (BMI) 23.0-23.9, adult: Secondary | ICD-10-CM | POA: Diagnosis not present

## 2021-02-06 DIAGNOSIS — M7061 Trochanteric bursitis, right hip: Secondary | ICD-10-CM | POA: Diagnosis not present

## 2021-02-06 DIAGNOSIS — E039 Hypothyroidism, unspecified: Secondary | ICD-10-CM | POA: Diagnosis not present

## 2021-02-06 NOTE — H&P (Signed)
TOTAL KNEE ADMISSION H&P  Patient is being admitted for right total knee arthroplasty.  Subjective:  Chief Complaint:right knee pain.  HPI: Laura Singleton, 75 y.o. female, has a history of pain and functional disability in the right knee due to arthritis and has failed non-surgical conservative treatments for greater than 12 weeks to includeNSAID's and/or analgesics, corticosteriod injections, and activity modification.  Onset of symptoms was gradual, starting 2 years ago with gradually worsening course since that time. The patient noted no past surgery on the right knee(s).  Patient currently rates pain in the right knee(s) at 8 out of 10 with activity. Patient has worsening of pain with activity and weight bearing, pain that interferes with activities of daily living, and pain with passive range of motion.  Patient has evidence of joint space narrowing by imaging studies. There is no active infection.  Patient Active Problem List   Diagnosis Date Noted   Spinal stenosis 07/17/2020   Body mass index (BMI) 26.0-26.9, adult 06/20/2020   Ulnar neuropathy of both upper extremities 06/20/2020   Encounter for monitoring flecainide therapy 05/29/2020   Cardiac murmur 05/29/2020   Skin cancer of face    Hypothyroidism 03/28/2020   GERD (gastroesophageal reflux disease) 03/28/2020   Cardiac arrhythmia 03/28/2020   S/P total knee arthroplasty, left 12/26/2019   History of total knee arthroplasty 12/26/2019   Preoperative cardiovascular examination 11/21/2019   Wolff-Parkinson-White (WPW) syndrome    PVC's (premature ventricular contractions)    Dysrhythmia    Depression    Arthritis    Chronic pain of left knee 11/17/2018   Carpal tunnel syndrome 06/07/2018   Neuropathy 05/31/2018   Oropharyngeal dysphagia 12/01/2016   WPW (Wolff-Parkinson-White syndrome) 11/05/2016   PVC (premature ventricular contraction) 11/05/2016   Hyperlipidemia 11/05/2016   Myelopathy of cervical spinal cord with  cervical radiculopathy ([place]) 11/05/2016   Cervical myelopathy ([place]) 11/05/2016   Ventricular premature beats 11/05/2016   Wolff-Parkinson-White (WPW) pattern 11/05/2016   Cervical radiculopathy 10/06/2016   Spinal stenosis in cervical region 10/06/2016   Past Medical History:  Diagnosis Date   Arthritis    Body mass index (BMI) 26.0-26.9, adult 06/20/2020   Cardiac arrhythmia 03/28/2020   Cardiac murmur 05/29/2020   Carpal tunnel syndrome 06/07/2018   Cervical myelopathy ([place]) 11/05/2016   Cervical radiculopathy 10/06/2016   Chronic pain of left knee 11/17/2018   Depression    Dysrhythmia    Encounter for monitoring flecainide therapy 05/29/2020   GERD (gastroesophageal reflux disease)    History of total knee arthroplasty 12/26/2019   Hyperlipidemia 11/05/2016   Hypothyroidism    Myelopathy of cervical spinal cord with cervical radiculopathy ([place]) 11/05/2016   Neuropathy 05/31/2018   Oropharyngeal dysphagia 12/01/2016   Preoperative cardiovascular examination 11/21/2019   PVC (premature ventricular contraction) 11/05/2016   PVC's (premature ventricular contractions)    S/P total knee arthroplasty, left 12/26/2019   Skin cancer of face    Spinal stenosis 07/17/2020   Spinal stenosis in cervical region 10/06/2016   Ulnar neuropathy of both upper extremities 06/20/2020   Ventricular premature beats 11/05/2016   Wolff-Parkinson-White (WPW) pattern 11/05/2016   Wolff-Parkinson-White (WPW) syndrome    WPW (Wolff-Parkinson-White syndrome) 11/05/2016    Past Surgical History:  Procedure Laterality Date   ANTERIOR CERVICAL DECOMP/DISCECTOMY FUSION N/A 11/05/2016   Procedure: ANTERIOR CERVICAL DECOMPRESSION/DISCECTOMY FUSION  - CERVICAL THREE-FOUR, CERVICAL FIVE-SIX, CERVICAL SIX -SEVEN;  Surgeon: Kary Kos, MD;  Location: [facility];  Service: Neurosurgery;  Laterality: N/A;   COLONOSCOPY  EYE SURGERY     both eyes lens implants   LUMBAR LAMINECTOMY/DECOMPRESSION MICRODISCECTOMY Bilateral  07/17/2020   Procedure: Laminectomy and Foraminotomy - bilateral - Thoracic ten-Thoracic eleven - Lumbar one-Lumbar two;  Surgeon: Kary Kos, MD;  Location: [facility];  Service: Neurosurgery;  Laterality: Bilateral;   SKIN CANCER EXCISION     Face   thumb surgery Right    TONSILLECTOMY     TOTAL KNEE ARTHROPLASTY Left 12/26/2019   Procedure: TOTAL KNEE ARTHROPLASTY;  Surgeon: Paralee Cancel, MD;  Location: [facility];  Service: Orthopedics;  Laterality: Left;  70 mins   TUBAL LIGATION      No current facility-administered medications for this encounter.   Current Outpatient Medications  Medication Sig Dispense Refill Last Dose   atorvastatin (LIPITOR) 20 MG tablet Take 20 mg by mouth at bedtime.      calcium carbonate (OS-CAL) 600 MG TABS tablet Take 1,200 mg by mouth at bedtime.      cyclobenzaprine (FLEXERIL) 10 MG tablet Take 10 mg by mouth at bedtime.      flecainide (TAMBOCOR) 100 MG tablet Take 100 mg by mouth 2 (two) times daily.      levothyroxine (SYNTHROID) 112 MCG tablet Take 112 mcg by mouth daily before breakfast.      Multiple Vitamin (MULTIVITAMIN WITH MINERALS) TABS tablet Take 1 tablet by mouth daily. Centrum Silver      omeprazole (PRILOSEC) 40 MG capsule Take 40 mg by mouth daily.      ropinirole (REQUIP) 5 MG tablet Take 5 mg by mouth at bedtime.      sertraline (ZOLOFT) 100 MG tablet Take 200 mg by mouth daily.      traZODone (DESYREL) 100 MG tablet Take 100 mg by mouth at bedtime.      TURMERIC CURCUMIN PO Take 1 capsule by mouth daily.      Allergies  Allergen Reactions   Bee Venom Itching, Swelling and Other (See Comments)    Passed out   Parafon Forte Dsc [Chlorzoxazone] Swelling and Other (See Comments)    Lips swelling and lips felt sunburned.   Sulfa Antibiotics Hives and Itching    Social History   Tobacco Use   Smoking status: Former    Types: Cigarettes   Smokeless tobacco: Never   Tobacco comments:    Quit 40 years ago  Substance Use Topics   Alcohol  use: No    Family History  Problem Relation Age of Onset   High Cholesterol Mother    High blood pressure Mother    High blood pressure Brother      Review of Systems  Constitutional:  Negative for chills and fever.  Respiratory:  Negative for cough and shortness of breath.   Cardiovascular:  Negative for chest pain.  Gastrointestinal:  Negative for nausea and vomiting.  Musculoskeletal:  Positive for arthralgias.    Objective:  Physical Exam Well nourished and well developed. General: Alert and oriented x3, cooperative and pleasant, no acute distress. Head: normocephalic, atraumatic, neck supple. Eyes: EOMI.  Musculoskeletal: Right knee exam: Slight valgus with out significant flexion contracture Tenderness over the lateral and anterior aspect the knee Passively correctable valgus Neurovascularly intact bilaterally without lower extremity edema or erythema  Calves soft and nontender. Motor function intact in LE. Strength 5/5 LE bilaterally. Neuro: Distal pulses 2+. Sensation to light touch intact in LE.  Vital signs in last 24 hours:    Labs:   Estimated body mass index is 27.54 kg/m as calculated  from the following:   Height as of 01/29/21: 5' 2.5" (1.588 m).   Weight as of 01/29/21: 69.4 kg.   Imaging Review Plain radiographs demonstrate severe degenerative joint disease of the right knee(s). The overall alignment isneutral. The bone quality appears to be adequate for age and reported activity level.      Assessment/Plan:  End stage arthritis, right knee   The patient history, physical examination, clinical judgment of the provider and imaging studies are consistent with end stage degenerative joint disease of the right knee(s) and total knee arthroplasty is deemed medically necessary. The treatment options including medical management, injection therapy arthroscopy and arthroplasty were discussed at length. The risks and benefits of total knee  arthroplasty were presented and reviewed. The risks due to aseptic loosening, infection, stiffness, patella tracking problems, thromboembolic complications and other imponderables were discussed. The patient acknowledged the explanation, agreed to proceed with the plan and consent was signed. Patient is being admitted for inpatient treatment for surgery, pain control, PT, OT, prophylactic antibiotics, VTE prophylaxis, progressive ambulation and ADL's and discharge planning. The patient is planning to be discharged  home.   Therapy Plans: outpatient therapy at [facility] in [place] Disposition: Home with son Planned DVT Prophylaxis: Xarelto 10mg  daily (states she cannot tolerate aspirin - heart palpatiations) DME needed: none PCP: Dr. Hale Bogus, clearance received Cardiologist: Dr. Geraldo Pitter, clearance received TXA: IV Allergies: chlorzoxazone - swelling, sulfa - hives Anesthesia Concerns: none BMI: 25.1 Last HgbA1c: not diabetic  Other: - Norco, No NSAIDs, flexeril - Hx of a fib, not anti-coagulated  Patient's anticipated LOS is less than 2 midnights, meeting these requirements: - Younger than 38 - Lives within 1 hour of care - Has a competent adult at home to recover with post-op recover - NO history of  - Chronic pain requiring opiods  - Diabetes  - Coronary Artery Disease  - Heart failure  - Heart attack  - Stroke  - DVT/VTE  - Cardiac arrhythmia  - Respiratory Failure/COPD  - Renal failure  - Anemia  - Advanced Liver disease  Costella Hatcher, PA-C Orthopedic Surgery [facility] [place] [phone]

## 2021-02-07 ENCOUNTER — Other Ambulatory Visit: Payer: Self-pay | Admitting: Orthopedic Surgery

## 2021-02-07 LAB — SARS CORONAVIRUS 2 (TAT 6-24 HRS): SARS Coronavirus 2: NEGATIVE

## 2021-02-10 DIAGNOSIS — R32 Unspecified urinary incontinence: Secondary | ICD-10-CM | POA: Diagnosis not present

## 2021-02-11 ENCOUNTER — Ambulatory Visit (HOSPITAL_COMMUNITY): Payer: Medicare HMO | Admitting: Certified Registered"

## 2021-02-11 ENCOUNTER — Encounter (HOSPITAL_COMMUNITY)
Admission: RE | Disposition: A | Discharge status: Home / Self Care | Payer: Self-pay | Source: Ambulatory Visit | Attending: Orthopedic Surgery

## 2021-02-11 ENCOUNTER — Other Ambulatory Visit: Payer: Self-pay

## 2021-02-11 ENCOUNTER — Encounter (HOSPITAL_COMMUNITY): Payer: Self-pay | Admitting: Orthopedic Surgery

## 2021-02-11 ENCOUNTER — Ambulatory Visit (HOSPITAL_COMMUNITY): Payer: Medicare HMO | Admitting: Physician Assistant

## 2021-02-11 ENCOUNTER — Observation Stay (HOSPITAL_COMMUNITY)
Admission: RE | Admit: 2021-02-11 | Discharge: 2021-02-12 | Disposition: A | Discharge status: Home / Self Care | Payer: Medicare HMO | Source: Ambulatory Visit | Attending: Orthopedic Surgery | Admitting: Orthopedic Surgery

## 2021-02-11 DIAGNOSIS — M1711 Unilateral primary osteoarthritis, right knee: Secondary | ICD-10-CM | POA: Diagnosis not present

## 2021-02-11 DIAGNOSIS — Z96651 Presence of right artificial knee joint: Secondary | ICD-10-CM

## 2021-02-11 DIAGNOSIS — Z79899 Other long term (current) drug therapy: Secondary | ICD-10-CM | POA: Insufficient documentation

## 2021-02-11 DIAGNOSIS — Z6826 Body mass index (BMI) 26.0-26.9, adult: Secondary | ICD-10-CM | POA: Diagnosis not present

## 2021-02-11 DIAGNOSIS — M25761 Osteophyte, right knee: Secondary | ICD-10-CM | POA: Diagnosis not present

## 2021-02-11 DIAGNOSIS — M25461 Effusion, right knee: Secondary | ICD-10-CM | POA: Diagnosis not present

## 2021-02-11 DIAGNOSIS — E039 Hypothyroidism, unspecified: Secondary | ICD-10-CM | POA: Diagnosis not present

## 2021-02-11 DIAGNOSIS — G8918 Other acute postprocedural pain: Secondary | ICD-10-CM | POA: Diagnosis not present

## 2021-02-11 DIAGNOSIS — Z87891 Personal history of nicotine dependence: Secondary | ICD-10-CM | POA: Diagnosis not present

## 2021-02-11 DIAGNOSIS — Z85828 Personal history of other malignant neoplasm of skin: Secondary | ICD-10-CM | POA: Diagnosis not present

## 2021-02-11 DIAGNOSIS — I456 Pre-excitation syndrome: Secondary | ICD-10-CM | POA: Diagnosis not present

## 2021-02-11 HISTORY — PX: TOTAL KNEE ARTHROPLASTY: SHX125

## 2021-02-11 HISTORY — DX: Presence of right artificial knee joint: Z96.651

## 2021-02-11 SURGERY — ARTHROPLASTY, KNEE, TOTAL
Anesthesia: Spinal | Site: Knee | Laterality: Right

## 2021-02-11 MED ORDER — FERROUS SULFATE 325 (65 FE) MG PO TABS
325.0000 mg | ORAL_TABLET | Freq: Three times a day (TID) | ORAL | Status: DC
  Administered 2021-02-11 – 2021-02-12 (×2): 325 mg via ORAL
  Filled 2021-02-11 (×2): qty 1

## 2021-02-11 MED ORDER — EPHEDRINE SULFATE-NACL 50-0.9 MG/10ML-% IV SOSY
PREFILLED_SYRINGE | INTRAVENOUS | Status: DC | PRN
  Administered 2021-02-11: 5 mg via INTRAVENOUS
  Administered 2021-02-11 (×2): 10 mg via INTRAVENOUS

## 2021-02-11 MED ORDER — KETOROLAC TROMETHAMINE 30 MG/ML IJ SOLN
INTRAMUSCULAR | Status: AC
  Filled 2021-02-11: qty 1

## 2021-02-11 MED ORDER — FENTANYL CITRATE (PF) 100 MCG/2ML IJ SOLN
INTRAMUSCULAR | Status: DC | PRN
  Administered 2021-02-11: 25 ug via INTRAVENOUS

## 2021-02-11 MED ORDER — CEFAZOLIN SODIUM-DEXTROSE 2-4 GM/100ML-% IV SOLN
2.0000 g | INTRAVENOUS | Status: AC
  Administered 2021-02-11: 2 g via INTRAVENOUS

## 2021-02-11 MED ORDER — RIVAROXABAN 10 MG PO TABS
10.0000 mg | ORAL_TABLET | Freq: Every day | ORAL | Status: DC
  Administered 2021-02-12: 10 mg via ORAL
  Filled 2021-02-11: qty 1

## 2021-02-11 MED ORDER — STERILE WATER FOR IRRIGATION IR SOLN
Status: DC | PRN
  Administered 2021-02-11: 2000 mL

## 2021-02-11 MED ORDER — SODIUM CHLORIDE 0.9 % IV SOLN: INTRAVENOUS | Status: DC

## 2021-02-11 MED ORDER — ACETAMINOPHEN 500 MG PO TABS
1000.0000 mg | ORAL_TABLET | Freq: Once | ORAL | Status: AC
  Administered 2021-02-11: 1000 mg via ORAL

## 2021-02-11 MED ORDER — HYDROCODONE-ACETAMINOPHEN 5-325 MG PO TABS
ORAL_TABLET | ORAL | Status: AC
  Administered 2021-02-11: 1 via ORAL
  Filled 2021-02-11: qty 1

## 2021-02-11 MED ORDER — LACTATED RINGERS IV SOLN: INTRAVENOUS | Status: DC

## 2021-02-11 MED ORDER — SODIUM CHLORIDE (PF) 0.9 % IJ SOLN
INTRAMUSCULAR | Status: DC | PRN
  Administered 2021-02-11: 30 mL

## 2021-02-11 MED ORDER — POLYETHYLENE GLYCOL 3350 17 G PO PACK: 17.0000 g | PACK | Freq: Every day | ORAL | Status: DC | PRN

## 2021-02-11 MED ORDER — SODIUM CHLORIDE 0.9 % IR SOLN
Status: DC | PRN
  Administered 2021-02-11: 1000 mL

## 2021-02-11 MED ORDER — METOCLOPRAMIDE HCL 5 MG PO TABS: 5.0000 mg | ORAL_TABLET | Freq: Three times a day (TID) | ORAL | Status: DC | PRN

## 2021-02-11 MED ORDER — ONDANSETRON HCL 4 MG/2ML IJ SOLN: 4.0000 mg | Freq: Once | INTRAMUSCULAR | Status: DC | PRN

## 2021-02-11 MED ORDER — POVIDONE-IODINE 10 % EX SWAB
2.0000 "application " | Freq: Once | CUTANEOUS | Status: AC
  Administered 2021-02-11: 2 via TOPICAL

## 2021-02-11 MED ORDER — DEXAMETHASONE SODIUM PHOSPHATE 10 MG/ML IJ SOLN
8.0000 mg | Freq: Once | INTRAMUSCULAR | Status: AC
  Administered 2021-02-11: 8 mg via INTRAVENOUS

## 2021-02-11 MED ORDER — ONDANSETRON HCL 4 MG/2ML IJ SOLN
INTRAMUSCULAR | Status: DC | PRN
  Administered 2021-02-11: 4 mg via INTRAVENOUS

## 2021-02-11 MED ORDER — CEFAZOLIN SODIUM-DEXTROSE 2-4 GM/100ML-% IV SOLN
2.0000 g | Freq: Four times a day (QID) | INTRAVENOUS | Status: AC
  Administered 2021-02-11 (×2): 2 g via INTRAVENOUS
  Filled 2021-02-11 (×2): qty 100

## 2021-02-11 MED ORDER — DEXAMETHASONE SODIUM PHOSPHATE 10 MG/ML IJ SOLN
10.0000 mg | Freq: Once | INTRAMUSCULAR | Status: AC
  Administered 2021-02-12: 10 mg via INTRAVENOUS
  Filled 2021-02-11: qty 1

## 2021-02-11 MED ORDER — FENTANYL CITRATE (PF) 100 MCG/2ML IJ SOLN
INTRAMUSCULAR | Status: AC
  Filled 2021-02-11: qty 2

## 2021-02-11 MED ORDER — CHLORHEXIDINE GLUCONATE 0.12 % MT SOLN
15.0000 mL | Freq: Once | OROMUCOSAL | Status: AC
  Administered 2021-02-11: 15 mL via OROMUCOSAL

## 2021-02-11 MED ORDER — CYCLOBENZAPRINE HCL 10 MG PO TABS: 10.0000 mg | ORAL_TABLET | Freq: Three times a day (TID) | ORAL | Status: DC | PRN

## 2021-02-11 MED ORDER — PROPOFOL 1000 MG/100ML IV EMUL
INTRAVENOUS | Status: AC
  Filled 2021-02-11: qty 100

## 2021-02-11 MED ORDER — PROPOFOL 10 MG/ML IV BOLUS
INTRAVENOUS | Status: AC
  Filled 2021-02-11: qty 20

## 2021-02-11 MED ORDER — PHENYLEPHRINE HCL-NACL 20-0.9 MG/250ML-% IV SOLN
INTRAVENOUS | Status: DC | PRN
  Administered 2021-02-11: 20 ug/min via INTRAVENOUS

## 2021-02-11 MED ORDER — ROPIVACAINE HCL 5 MG/ML IJ SOLN
INTRAMUSCULAR | Status: DC | PRN
  Administered 2021-02-11: 30 mL via PERINEURAL

## 2021-02-11 MED ORDER — PROPOFOL 10 MG/ML IV BOLUS
INTRAVENOUS | Status: DC | PRN
  Administered 2021-02-11: 70 mg via INTRAVENOUS

## 2021-02-11 MED ORDER — KETOROLAC TROMETHAMINE 30 MG/ML IJ SOLN
INTRAMUSCULAR | Status: DC | PRN
  Administered 2021-02-11: 30 mg

## 2021-02-11 MED ORDER — FENTANYL CITRATE PF 50 MCG/ML IJ SOSY: 25.0000 ug | PREFILLED_SYRINGE | INTRAMUSCULAR | Status: DC | PRN

## 2021-02-11 MED ORDER — TRANEXAMIC ACID-NACL 1000-0.7 MG/100ML-% IV SOLN
INTRAVENOUS | Status: AC
  Filled 2021-02-11: qty 100

## 2021-02-11 MED ORDER — ACETAMINOPHEN 500 MG PO TABS
ORAL_TABLET | ORAL | Status: AC
  Filled 2021-02-11: qty 2

## 2021-02-11 MED ORDER — AMISULPRIDE (ANTIEMETIC) 5 MG/2ML IV SOLN: 10.0000 mg | Freq: Once | INTRAVENOUS | Status: DC | PRN

## 2021-02-11 MED ORDER — TRAZODONE HCL 100 MG PO TABS
100.0000 mg | ORAL_TABLET | Freq: Every day | ORAL | Status: DC
  Administered 2021-02-11: 100 mg via ORAL
  Filled 2021-02-11: qty 1

## 2021-02-11 MED ORDER — FENTANYL CITRATE PF 50 MCG/ML IJ SOSY
PREFILLED_SYRINGE | INTRAMUSCULAR | Status: AC
  Administered 2021-02-11: 50 ug via INTRAVENOUS
  Filled 2021-02-11: qty 2

## 2021-02-11 MED ORDER — TRANEXAMIC ACID-NACL 1000-0.7 MG/100ML-% IV SOLN
1000.0000 mg | INTRAVENOUS | Status: AC
  Administered 2021-02-11: 1000 mg via INTRAVENOUS

## 2021-02-11 MED ORDER — LEVOTHYROXINE SODIUM 112 MCG PO TABS
112.0000 ug | ORAL_TABLET | Freq: Every day | ORAL | Status: DC
  Administered 2021-02-12: 112 ug via ORAL
  Filled 2021-02-11: qty 1

## 2021-02-11 MED ORDER — HYDROCODONE-ACETAMINOPHEN 5-325 MG PO TABS
1.0000 | ORAL_TABLET | ORAL | Status: DC | PRN
  Administered 2021-02-11: 1 via ORAL
  Administered 2021-02-12: 2 via ORAL
  Filled 2021-02-11 (×2): qty 1
  Filled 2021-02-11: qty 2

## 2021-02-11 MED ORDER — HYDROCODONE-ACETAMINOPHEN 7.5-325 MG PO TABS: 1.0000 | ORAL_TABLET | ORAL | Status: DC | PRN

## 2021-02-11 MED ORDER — MIDAZOLAM HCL 2 MG/2ML IJ SOLN: 1.0000 mg | INTRAMUSCULAR | Status: AC

## 2021-02-11 MED ORDER — OXYCODONE HCL 5 MG/5ML PO SOLN: 5.0000 mg | Freq: Once | ORAL | Status: DC | PRN

## 2021-02-11 MED ORDER — ATORVASTATIN CALCIUM 20 MG PO TABS
20.0000 mg | ORAL_TABLET | Freq: Every day | ORAL | Status: DC
  Administered 2021-02-11: 20 mg via ORAL
  Filled 2021-02-11: qty 1

## 2021-02-11 MED ORDER — PHENOL 1.4 % MT LIQD: 1.0000 | OROMUCOSAL | Status: DC | PRN

## 2021-02-11 MED ORDER — CEFAZOLIN SODIUM-DEXTROSE 2-4 GM/100ML-% IV SOLN
INTRAVENOUS | Status: AC
  Filled 2021-02-11: qty 100

## 2021-02-11 MED ORDER — DEXAMETHASONE SODIUM PHOSPHATE 10 MG/ML IJ SOLN
INTRAMUSCULAR | Status: AC
  Filled 2021-02-11: qty 1

## 2021-02-11 MED ORDER — HYDROMORPHONE HCL 1 MG/ML IJ SOLN: 0.5000 mg | INTRAMUSCULAR | Status: DC | PRN

## 2021-02-11 MED ORDER — METOCLOPRAMIDE HCL 5 MG/ML IJ SOLN: 5.0000 mg | Freq: Three times a day (TID) | INTRAMUSCULAR | Status: DC | PRN

## 2021-02-11 MED ORDER — MIDAZOLAM HCL 2 MG/2ML IJ SOLN
INTRAMUSCULAR | Status: DC | PRN
  Administered 2021-02-11: 1 mg via INTRAVENOUS

## 2021-02-11 MED ORDER — ONDANSETRON HCL 4 MG/2ML IJ SOLN
INTRAMUSCULAR | Status: AC
  Filled 2021-02-11: qty 2

## 2021-02-11 MED ORDER — SERTRALINE HCL 100 MG PO TABS
200.0000 mg | ORAL_TABLET | Freq: Every day | ORAL | Status: DC
  Administered 2021-02-11: 200 mg via ORAL
  Filled 2021-02-11: qty 2

## 2021-02-11 MED ORDER — TRANEXAMIC ACID-NACL 1000-0.7 MG/100ML-% IV SOLN
1000.0000 mg | Freq: Once | INTRAVENOUS | Status: AC
  Administered 2021-02-11: 1000 mg via INTRAVENOUS

## 2021-02-11 MED ORDER — DOCUSATE SODIUM 100 MG PO CAPS
100.0000 mg | ORAL_CAPSULE | Freq: Two times a day (BID) | ORAL | Status: DC
  Filled 2021-02-11: qty 1

## 2021-02-11 MED ORDER — DIPHENHYDRAMINE HCL 12.5 MG/5ML PO ELIX: 12.5000 mg | ORAL_SOLUTION | ORAL | Status: DC | PRN

## 2021-02-11 MED ORDER — ONDANSETRON HCL 4 MG PO TABS
4.0000 mg | ORAL_TABLET | Freq: Four times a day (QID) | ORAL | Status: DC | PRN
  Filled 2021-02-11: qty 1

## 2021-02-11 MED ORDER — PANTOPRAZOLE SODIUM 40 MG PO TBEC
40.0000 mg | DELAYED_RELEASE_TABLET | Freq: Every day | ORAL | Status: DC
  Administered 2021-02-12: 40 mg via ORAL
  Filled 2021-02-11 (×2): qty 1

## 2021-02-11 MED ORDER — FLECAINIDE ACETATE 100 MG PO TABS
100.0000 mg | ORAL_TABLET | Freq: Two times a day (BID) | ORAL | Status: DC
  Administered 2021-02-11 – 2021-02-12 (×2): 100 mg via ORAL
  Filled 2021-02-11 (×2): qty 1

## 2021-02-11 MED ORDER — ROPINIROLE HCL 1 MG PO TABS
5.0000 mg | ORAL_TABLET | Freq: Every day | ORAL | Status: DC
  Administered 2021-02-11: 5 mg via ORAL
  Filled 2021-02-11: qty 10

## 2021-02-11 MED ORDER — BISACODYL 10 MG RE SUPP: 10.0000 mg | Freq: Every day | RECTAL | Status: DC | PRN

## 2021-02-11 MED ORDER — PROPOFOL 500 MG/50ML IV EMUL
INTRAVENOUS | Status: DC | PRN
Start: 2021-02-11 — End: 2021-02-11
  Administered 2021-02-11: 20 ug/kg/min via INTRAVENOUS

## 2021-02-11 MED ORDER — ACETAMINOPHEN 325 MG PO TABS: 325.0000 mg | ORAL_TABLET | Freq: Four times a day (QID) | ORAL | Status: DC | PRN

## 2021-02-11 MED ORDER — ORAL CARE MOUTH RINSE
15.0000 mL | Freq: Once | OROMUCOSAL | Status: AC
Start: 2021-02-11 — End: 2021-02-11

## 2021-02-11 MED ORDER — BUPIVACAINE-EPINEPHRINE (PF) 0.25% -1:200000 IJ SOLN
INTRAMUSCULAR | Status: DC | PRN
  Administered 2021-02-11: 30 mL

## 2021-02-11 MED ORDER — 0.9 % SODIUM CHLORIDE (POUR BTL) OPTIME
TOPICAL | Status: DC | PRN
  Administered 2021-02-11: 1000 mL

## 2021-02-11 MED ORDER — FENTANYL CITRATE PF 50 MCG/ML IJ SOSY: 50.0000 ug | PREFILLED_SYRINGE | INTRAMUSCULAR | Status: AC

## 2021-02-11 MED ORDER — OXYCODONE HCL 5 MG PO TABS: 5.0000 mg | ORAL_TABLET | Freq: Once | ORAL | Status: DC | PRN

## 2021-02-11 MED ORDER — ONDANSETRON HCL 4 MG/2ML IJ SOLN: 4.0000 mg | Freq: Four times a day (QID) | INTRAMUSCULAR | Status: DC | PRN

## 2021-02-11 MED ORDER — MIDAZOLAM HCL 2 MG/2ML IJ SOLN
INTRAMUSCULAR | Status: AC
  Filled 2021-02-11: qty 2

## 2021-02-11 MED ORDER — MENTHOL 3 MG MT LOZG: 1.0000 | LOZENGE | OROMUCOSAL | Status: DC | PRN

## 2021-02-11 MED ORDER — MIDAZOLAM HCL 2 MG/2ML IJ SOLN
INTRAMUSCULAR | Status: AC
  Administered 2021-02-11: 1 mg via INTRAVENOUS
  Filled 2021-02-11: qty 2

## 2021-02-11 SURGICAL SUPPLY — 48 items
ATTUNE MED ANAT PAT 35 KNEE (Knees) ×2 IMPLANT
ATTUNE PSFEM RTSZ4 NARCEM KNEE (Femur) ×2 IMPLANT
ATTUNE PSRP INSR SZ4 7 KNEE (Insert) ×2 IMPLANT
BAG COUNTER SPONGE SURGICOUNT (BAG) IMPLANT
BAG ZIPLOCK 12X15 (MISCELLANEOUS) IMPLANT
BASEPLATE TIBIAL ROTATING SZ 4 (Knees) ×2 IMPLANT
BLADE SAW SGTL 11.0X1.19X90.0M (BLADE) IMPLANT
BLADE SAW SGTL 13.0X1.19X90.0M (BLADE) ×2 IMPLANT
BLADE SURG SZ10 CARB STEEL (BLADE) ×4 IMPLANT
BNDG ELASTIC 6X5.8 VLCR STR LF (GAUZE/BANDAGES/DRESSINGS) ×2 IMPLANT
BOWL SMART MIX CTS (DISPOSABLE) ×2 IMPLANT
CEMENT HV SMART SET (Cement) ×4 IMPLANT
CUFF TOURN SGL QUICK 34 (TOURNIQUET CUFF) ×1
CUFF TRNQT CYL 34X4.125X (TOURNIQUET CUFF) ×1 IMPLANT
DECANTER SPIKE VIAL GLASS SM (MISCELLANEOUS) ×4 IMPLANT
DERMABOND ADVANCED (GAUZE/BANDAGES/DRESSINGS) ×1
DERMABOND ADVANCED .7 DNX12 (GAUZE/BANDAGES/DRESSINGS) ×1 IMPLANT
DRAPE INCISE IOBAN 66X45 STRL (DRAPES) ×2 IMPLANT
DRAPE U-SHAPE 47X51 STRL (DRAPES) ×2 IMPLANT
DRESSING AQUACEL AG SP 3.5X10 (GAUZE/BANDAGES/DRESSINGS) ×1 IMPLANT
DRSG AQUACEL AG SP 3.5X10 (GAUZE/BANDAGES/DRESSINGS) ×2
DURAPREP 26ML APPLICATOR (WOUND CARE) ×4 IMPLANT
ELECT REM PT RETURN 15FT ADLT (MISCELLANEOUS) ×2 IMPLANT
GLOVE SURG ENC MOIS LTX SZ6 (GLOVE) ×2 IMPLANT
GLOVE SURG ENC MOIS LTX SZ7 (GLOVE) ×2 IMPLANT
GLOVE SURG UNDER POLY LF SZ7.5 (GLOVE) ×2 IMPLANT
GOWN STRL REUS W/TWL LRG LVL3 (GOWN DISPOSABLE) ×2 IMPLANT
HANDPIECE INTERPULSE COAX TIP (DISPOSABLE) ×1
HOLDER FOLEY CATH W/STRAP (MISCELLANEOUS) IMPLANT
KIT TURNOVER KIT A (KITS) IMPLANT
MANIFOLD NEPTUNE II (INSTRUMENTS) ×2 IMPLANT
NDL SAFETY ECLIPSE 18X1.5 (NEEDLE) IMPLANT
NEEDLE HYPO 18GX1.5 SHARP (NEEDLE)
NS IRRIG 1000ML POUR BTL (IV SOLUTION) ×2 IMPLANT
PACK TOTAL KNEE CUSTOM (KITS) ×2 IMPLANT
PROTECTOR NERVE ULNAR (MISCELLANEOUS) ×2 IMPLANT
SET HNDPC FAN SPRY TIP SCT (DISPOSABLE) ×1 IMPLANT
SET PAD KNEE POSITIONER (MISCELLANEOUS) ×2 IMPLANT
SUT MNCRL AB 4-0 PS2 18 (SUTURE) ×2 IMPLANT
SUT STRATAFIX PDS+ 0 24IN (SUTURE) ×2 IMPLANT
SUT VIC AB 1 CT1 36 (SUTURE) ×2 IMPLANT
SUT VIC AB 2-0 CT1 27 (SUTURE) ×3
SUT VIC AB 2-0 CT1 TAPERPNT 27 (SUTURE) ×3 IMPLANT
SYR 3ML LL SCALE MARK (SYRINGE) ×2 IMPLANT
TRAY FOLEY MTR SLVR 16FR STAT (SET/KITS/TRAYS/PACK) ×2 IMPLANT
TUBE SUCTION HIGH CAP CLEAR NV (SUCTIONS) ×2 IMPLANT
WATER STERILE IRR 1000ML POUR (IV SOLUTION) ×4 IMPLANT
WRAP KNEE MAXI GEL POST OP (GAUZE/BANDAGES/DRESSINGS) ×2 IMPLANT

## 2021-02-11 NOTE — Interval H&P Note (Signed)
History and Physical Interval Note:  02/11/2021 8:43 AM  Laura Singleton  has presented today for surgery, with the diagnosis of Right knee osteoarthritis.  The various methods of treatment have been discussed with the patient and family. After consideration of risks, benefits and other options for treatment, the patient has consented to  Procedure(s): TOTAL KNEE ARTHROPLASTY (Right) as a surgical intervention.  The patient's history has been reviewed, patient examined, no change in status, stable for surgery.  I have reviewed the patient's chart and labs.  Questions were answered to the patient's satisfaction.     Mauri Pole

## 2021-02-11 NOTE — Plan of Care (Signed)
  Problem: Education: Goal: Knowledge of General Education information will improve Description: Including pain rating scale, medication(s)/side effects and non-pharmacologic comfort measures Outcome: Progressing   Problem: Activity: Goal: Risk for activity intolerance will decrease Outcome: Progressing   Problem: Nutrition: Goal: Adequate nutrition will be maintained Outcome: Progressing   Problem: Elimination: Goal: Will not experience complications related to bowel motility Outcome: Progressing   Problem: Pain Managment: Goal: General experience of comfort will improve Outcome: Progressing   Problem: Education: Goal: Knowledge of General Education information will improve Description: Including pain rating scale, medication(s)/side effects and non-pharmacologic comfort measures Outcome: Progressing   Problem: Activity: Goal: Risk for activity intolerance will decrease Outcome: Progressing   Problem: Nutrition: Goal: Adequate nutrition will be maintained Outcome: Progressing   Problem: Elimination: Goal: Will not experience complications related to bowel motility Outcome: Progressing   Problem: Pain Managment: Goal: General experience of comfort will improve Outcome: Progressing   Problem: Safety: Goal: Ability to remain free from injury will improve Outcome: Progressing

## 2021-02-11 NOTE — Care Plan (Signed)
Ortho Bundle Case Management Note  Patient Details  Name: Laura Singleton MRN: [id number] Date of Birth: June 03, 1945  R TKA on 02-11-21 DCP:  Home with son DME:  No needs. Has a RW. PT:  [facility] on 02-14-21.                   DME Arranged:  N/A DME Agency:  NA  HH Arranged:  NA HH Agency:  NA  Additional Comments: Please contact me with any questions of if this plan should need to change.  Marianne Sofia, RN,CCM [facility]  [phone] 02/11/2021, 8:34 AM

## 2021-02-11 NOTE — Anesthesia Procedure Notes (Addendum)
Procedure Name: LMA Insertion Date/Time: 02/11/2021 10:38 AM Performed by: Eben Burow, CRNA Pre-anesthesia Checklist: Patient identified, Emergency Drugs available, Suction available, Patient being monitored and Timeout performed Patient Re-evaluated:Patient Re-evaluated prior to induction Oxygen Delivery Method: Circle system utilized Preoxygenation: Pre-oxygenation with 100% oxygen Induction Type: IV induction Ventilation: Mask ventilation without difficulty LMA: LMA inserted and LMA with gastric port inserted LMA Size: 4.0 Number of attempts: 1 Tube secured with: Tape Dental Injury: Teeth and Oropharynx as per pre-operative assessment

## 2021-02-11 NOTE — Anesthesia Postprocedure Evaluation (Signed)
Anesthesia Post Note  Patient: Laura Singleton  Procedure(s) Performed: TOTAL KNEE ARTHROPLASTY (Right: Knee)     Patient location during evaluation: PACU Anesthesia Type: General Level of consciousness: awake and alert Pain management: pain level controlled Vital Signs Assessment: post-procedure vital signs reviewed and stable Respiratory status: spontaneous breathing, nonlabored ventilation and respiratory function stable Cardiovascular status: blood pressure returned to baseline and stable Postop Assessment: no apparent nausea or vomiting Anesthetic complications: no   No notable events documented.  Last Vitals:  Vitals:   02/11/21 1300 02/11/21 1400  BP: 129/64 (!) 113/93  Pulse: (!) 59 63  Resp: 16 19  Temp:    SpO2: 100% 95%    Last Pain:  Vitals:   02/11/21 1400  TempSrc:   PainSc: 0-No pain                 Lidia Collum

## 2021-02-11 NOTE — Anesthesia Preprocedure Evaluation (Addendum)
Anesthesia Evaluation  Patient identified by MRN, date of birth, ID band Patient awake    Reviewed: Allergy & Precautions, NPO status , Patient's Chart, lab work & pertinent test results  History of Anesthesia Complications Negative for: history of anesthetic complications  Airway Mallampati: II  TM Distance: >3 FB     Dental  (+) Teeth Intact   Pulmonary neg pulmonary ROS, former smoker,    Pulmonary exam normal        Cardiovascular Normal cardiovascular exam+ dysrhythmias (WPW) Supra Ventricular Tachycardia    Per cardiology preoperative evaluation 12/18/2020, "Chart reviewed as part of pre-operative protocol coverage. Patient was contacted10/12/2022in reference to pre-operative risk assessment for pending surgery as outlined below. Fotini Coxwas last seen on 9/14/2022by Dr. Geraldo Pitter. Since that day, Audery Coxhas done well without any exertional chest pain or worsening dyspnea.Despite knee issue, she is able to accomplish more than 4 METS of activity by walk a mile. She had a normalechocardiogram in April 2022 and last Myoview obtained on 12/05/2019 was also normal. Therefore, based on ACC/AHA guidelines, the patient would be at acceptable risk for the planned procedure without further cardiovascular testing"   Neuro/Psych Depression    GI/Hepatic Neg liver ROS, GERD  ,  Endo/Other  Hypothyroidism   Renal/GU negative Renal ROS  negative genitourinary   Musculoskeletal  (+) Arthritis , Osteoarthritis,  Multiple back surgeries   Abdominal   Peds  Hematology negative hematology ROS (+)   Anesthesia Other Findings  WPW syndrome, GERD, hypothyroid, s/p ACDF Plts 326, no anticoagulants  Stress test 12/05/19:  The left ventricular ejection fraction is normal (55-65%).  Nuclear stress EF: 64%.  There was no ST segment deviation noted during stress.  No T wave inversion was noted during stress.  The study is  normal.  This is a low risk study.  EKG 11/21/19: NSR  Cleared by Cardiology     Reproductive/Obstetrics                            Anesthesia Physical  Anesthesia Plan  ASA: 2  Anesthesia Plan: Spinal   Post-op Pain Management:  Regional for Post-op pain and Tylenol PO (pre-op) and Regional block   Induction:   PONV Risk Score and Plan: 2 and Propofol infusion, Treatment may vary due to age or medical condition, Ondansetron and TIVA  Airway Management Planned: Nasal Cannula and Simple Face Mask  Additional Equipment: None  Intra-op Plan:   Post-operative Plan:   Informed Consent: I have reviewed the patients History and Physical, chart, labs and discussed the procedure including the risks, benefits and alternatives for the proposed anesthesia with the patient or authorized representative who has indicated his/her understanding and acceptance.       Plan Discussed with:   Anesthesia Plan Comments:        Anesthesia Quick Evaluation

## 2021-02-11 NOTE — Progress Notes (Signed)
AssistedDr. Carolyn Witman with right, ultrasound guided, adductor canal block. Side rails up, monitors on throughout procedure. See vital signs in flow sheet. Tolerated Procedure well.  

## 2021-02-11 NOTE — Anesthesia Procedure Notes (Signed)
Procedure Name: MAC Date/Time: 02/11/2021 9:57 AM Performed by: Eben Burow, CRNA Pre-anesthesia Checklist: Patient identified, Emergency Drugs available, Suction available, Patient being monitored and Timeout performed Oxygen Delivery Method: Simple face mask Placement Confirmation: positive ETCO2

## 2021-02-11 NOTE — Discharge Instructions (Addendum)

## 2021-02-11 NOTE — Anesthesia Procedure Notes (Signed)
Spinal  Patient location during procedure: OR Reason for block: surgical anesthesia Staffing Performed: anesthesiologist  Anesthesiologist: Lidia Collum, MD Preanesthetic Checklist Completed: patient identified, IV checked, risks and benefits discussed, surgical consent, monitors and equipment checked, pre-op evaluation and timeout performed Spinal Block Patient position: sitting Prep: DuraPrep and site prepped and draped Patient monitoring: continuous pulse ox, blood pressure and heart rate Approach: midline Location: L3-4 Injection technique: single-shot Needle Needle type: Quincke  Needle gauge: 22 G Needle length: 10 cm Assessment Events: CSF return Additional Notes Functioning IV was confirmed and monitors were applied. Sterile prep and drape, including hand hygiene and sterile gloves were used. The patient was positioned and the spine was prepped. The skin was anesthetized with lidocaine.  Free flow of clear CSF was obtained prior to injecting local anesthetic into the CSF. The needle was carefully withdrawn. The patient tolerated the procedure well.

## 2021-02-11 NOTE — Op Note (Signed)
NAME:  Laura Singleton                      MEDICAL RECORD NO.:  [id number]                             FACILITY:  [facility]      PHYSICIAN:  Pietro Cassis. Alvan Dame, M.D.  DATE OF BIRTH:  [birth year]      DATE OF PROCEDURE:  02/11/2021                                     OPERATIVE REPORT         PREOPERATIVE DIAGNOSIS:  Right knee osteoarthritis.      POSTOPERATIVE DIAGNOSIS:  Right knee osteoarthritis.      FINDINGS:  The patient was noted to have complete loss of cartilage and   bone-on-bone arthritis with associated osteophytes in the lateral and patellofemoral compartments of   the knee with a significant synovitis and associated effusion.  The patient had failed months of conservative treatment including medications, injection therapy, activity modification.     PROCEDURE:  Right total knee replacement.      COMPONENTS USED:  DePuy Attune rotating platform posterior stabilized knee   system, a size 4N femur, 4 tibia, size 7 mm PS AOX insert, and 35 anatomic patellar   button.      SURGEON:  Pietro Cassis. Alvan Dame, M.D.      ASSISTANT:  Costella Hatcher, PA-C.      ANESTHESIA:  General, Regional, and Spinal.      SPECIMENS:  None.      COMPLICATION:  None.      DRAINS:  None.  EBL: <100 cc      TOURNIQUET TIME:   Total Tourniquet Time Documented: Thigh (Right) - 34 minutes Total: Thigh (Right) - 34 minutes  .      The patient was stable to the recovery room.      INDICATION FOR PROCEDURE:  Laura Singleton is a 75 y.o. female patient of   mine.  The patient had been seen, evaluated, and treated for months conservatively in the   office with medication, activity modification, and injections.  The patient had   radiographic changes of bone-on-bone arthritis with endplate sclerosis and osteophytes noted.  Based on the radiographic changes and failed conservative measures, the patient   decided to proceed with definitive treatment, total knee replacement.  Risks of infection, DVT, component failure,  need for revision surgery, neurovascular injury were reviewed in the office setting.  The postop course was reviewed stressing the efforts to maximize post-operative satisfaction and function.  Consent was obtained for benefit of pain   relief.      PROCEDURE IN DETAIL:  The patient was brought to the operative theater.   Once adequate anesthesia, preoperative antibiotics, 2 gm of Ancef,1 gm of Tranexamic Acid, and 10 mg of Decadron administered, the patient was positioned supine with a right thigh tourniquet placed.  The  right lower extremity was prepped and draped in sterile fashion.  A time-   out was performed identifying the patient, planned procedure, and the appropriate extremity.      The right lower extremity was placed in the [facility] leg holder.  The leg was   exsanguinated, tourniquet elevated to 250 mmHg.  A midline incision was   made  followed by median parapatellar arthrotomy.  Following initial   exposure, attention was first directed to the patella.  Precut   measurement was noted to be 21 mm.  I resected down to 13 mm and used a   35 anatomic patellar button to restore patellar height as well as cover the cut surface.      The lug holes were drilled and a metal shim was placed to protect the   patella from retractors and saw blade during the procedure.      At this point, attention was now directed to the femur.  The femoral   canal was opened with a drill, irrigated to try to prevent fat emboli.  An   intramedullary rod was passed at 3 degrees valgus, 11 mm of bone was   resected off the distal femur due to pre-operative flexion contracture.  Following this resection, the tibia was   subluxated anteriorly.  Using the extramedullary guide, 3 mm of bone was resected off   the proximal lateral tibia.  We confirmed the gap would be   stable medially and laterally with a size 6 spacer block as well as confirmed that the tibial cut was perpendicular in the coronal plane, checking  with an alignment rod.      Once this was done, I sized the femur to be a size 4 in the anterior-   posterior dimension, chose a narrow component based on medial and   lateral dimension.  The size 4 rotation block was then pinned in   position anterior referenced using the C-clamp to set rotation.  The   anterior, posterior, and  chamfer cuts were made without difficulty nor   notching making certain that I was along the anterior cortex to help   with flexion gap stability.      The final box cut was made off the lateral aspect of distal femur.      At this point, the tibia was sized to be a size 4.  The size 4 tray was   then pinned in position through the medial third of the tubercle,   drilled, and keel punched.  Trial reduction was now carried with a 4 femur,  4 tibia, a size 7 mm PS insert, and the 35 anatomic patella botton.  The knee was brought to full extension with good flexion stability with the patella   tracking through the trochlea without application of pressure.  Given   all these findings the trial components removed.  Final components were   opened and cement was mixed.  The knee was irrigated with normal saline solution and pulse lavage.  The synovial lining was   then injected with 30 cc of 0.25% Marcaine with epinephrine, 1 cc of Toradol and 30 cc of NS for a total of 61 cc.     Final implants were then cemented onto cleaned and dried cut surfaces of bone with the knee brought to extension with a size 7 mm PS trial insert.      Once the cement had fully cured, excess cement was removed   throughout the knee.  I confirmed that I was satisfied with the range of   motion and stability, and the final size 7 mm PS AOX insert was chosen.  It was   placed into the knee.      The tourniquet had been let down at 33 minutes.  No significant   hemostasis was required.  The extensor mechanism was then  reapproximated using #1 Vicryl and #1 Stratafix sutures with the knee   in  flexion.  The   remaining wound was closed with 2-0 Vicryl and running 4-0 Monocryl.   The knee was cleaned, dried, dressed sterilely using Dermabond and   Aquacel dressing.  The patient was then   brought to recovery room in stable condition, tolerating the procedure   well.   Please note that Physician Assistant, Costella Hatcher, PA-C was present for the entirety of the case, and was utilized for pre-operative positioning, peri-operative retractor management, general facilitation of the procedure and for primary wound closure at the end of the case.              Pietro Cassis Alvan Dame, M.D.    02/11/2021 11:31 AM

## 2021-02-11 NOTE — Anesthesia Procedure Notes (Signed)
Anesthesia Regional Block: Adductor canal block   Pre-Anesthetic Checklist: , timeout performed,  Correct Patient, Correct Site, Correct Laterality,  Correct Procedure, Correct Position, site marked,  Risks and benefits discussed,  Surgical consent,  Pre-op evaluation,  At surgeon's request and post-op pain management  Laterality: Right  Prep: chloraprep       Needles:  Injection technique: Single-shot  Needle Type: Echogenic Stimulator Needle     Needle Length: 10cm  Needle Gauge: 20     Additional Needles:   Procedures:,,,, ultrasound used (permanent image in chart),,    Narrative:  Start time: 02/11/2021 9:23 AM End time: 02/11/2021 9:26 AM Injection made incrementally with aspirations every 5 mL.  Performed by: Personally  Anesthesiologist: Lidia Collum, MD  Additional Notes: Standard monitors applied. Skin prepped. Good needle visualization with ultrasound. Injection made in 5cc increments with no resistance to injection. Patient tolerated the procedure well.

## 2021-02-11 NOTE — Transfer of Care (Signed)
Immediate Anesthesia Transfer of Care Note  Patient: Laura Singleton  Procedure(s) Performed: TOTAL KNEE ARTHROPLASTY (Right: Knee)  Patient Location: PACU  Anesthesia Type:General and Spinal  Level of Consciousness: awake, alert  and patient cooperative  Airway & Oxygen Therapy: Patient Spontanous Breathing and Patient connected to face mask oxygen  Post-op Assessment: Report given to RN and Post -op Vital signs reviewed and stable  Post vital signs: Reviewed and stable  Last Vitals:  Vitals Value Taken Time  BP 130/63 02/11/21 1153  Temp    Pulse 59 02/11/21 1156  Resp 20 02/11/21 1156  SpO2 100 % 02/11/21 1156  Vitals shown include unvalidated device data.  Last Pain:  Vitals:   02/11/21 0750  TempSrc: Oral  PainSc: 5       Patients Stated Pain Goal: 4 ([birth year] 8832)  Complications: No notable events documented.

## 2021-02-12 DIAGNOSIS — Z87891 Personal history of nicotine dependence: Secondary | ICD-10-CM | POA: Diagnosis not present

## 2021-02-12 DIAGNOSIS — I456 Pre-excitation syndrome: Secondary | ICD-10-CM | POA: Diagnosis not present

## 2021-02-12 DIAGNOSIS — M1711 Unilateral primary osteoarthritis, right knee: Secondary | ICD-10-CM | POA: Diagnosis not present

## 2021-02-12 DIAGNOSIS — Z6826 Body mass index (BMI) 26.0-26.9, adult: Secondary | ICD-10-CM | POA: Diagnosis not present

## 2021-02-12 DIAGNOSIS — E039 Hypothyroidism, unspecified: Secondary | ICD-10-CM | POA: Diagnosis not present

## 2021-02-12 DIAGNOSIS — Z79899 Other long term (current) drug therapy: Secondary | ICD-10-CM | POA: Diagnosis not present

## 2021-02-12 DIAGNOSIS — Z85828 Personal history of other malignant neoplasm of skin: Secondary | ICD-10-CM | POA: Diagnosis not present

## 2021-02-12 LAB — CBC
HCT: 28.5 % — ABNORMAL LOW (ref 36.0–46.0)
Hemoglobin: 9.1 g/dL — ABNORMAL LOW (ref 12.0–15.0)
MCH: 29.2 pg (ref 26.0–34.0)
MCHC: 31.9 g/dL (ref 30.0–36.0)
MCV: 91.3 fL (ref 80.0–100.0)
Platelets: 241 10*3/uL (ref 150–400)
RBC: 3.12 MIL/uL — ABNORMAL LOW (ref 3.87–5.11)
RDW: 14.6 % (ref 11.5–15.5)
WBC: 7.7 10*3/uL (ref 4.0–10.5)
nRBC: 0 % (ref 0.0–0.2)

## 2021-02-12 LAB — BASIC METABOLIC PANEL
Anion gap: 6 (ref 5–15)
BUN: 16 mg/dL (ref 8–23)
CO2: 27 mmol/L (ref 22–32)
Calcium: 8.5 mg/dL — ABNORMAL LOW (ref 8.9–10.3)
Chloride: 108 mmol/L (ref 98–111)
Creatinine, Ser: 0.71 mg/dL (ref 0.44–1.00)
GFR, Estimated: >60 mL/min (ref >60)
Glucose, Bld: 105 mg/dL — ABNORMAL HIGH (ref 70–99)
Potassium: 3.9 mmol/L (ref 3.5–5.1)
Sodium: 141 mmol/L (ref 135–145)

## 2021-02-12 MED ORDER — DOCUSATE SODIUM 100 MG PO CAPS: 100.0000 mg | ORAL_CAPSULE | Freq: Two times a day (BID) | ORAL | 0 refills | Status: DC

## 2021-02-12 MED ORDER — HYDROCODONE-ACETAMINOPHEN 5-325 MG PO TABS: 1.0000 | ORAL_TABLET | ORAL | 0 refills | Status: DC | PRN

## 2021-02-12 MED ORDER — CYCLOBENZAPRINE HCL 10 MG PO TABS: 10.0000 mg | ORAL_TABLET | Freq: Three times a day (TID) | ORAL | 0 refills | Status: DC | PRN

## 2021-02-12 MED ORDER — RIVAROXABAN 10 MG PO TABS: 10.0000 mg | ORAL_TABLET | Freq: Every day | ORAL | 0 refills | Status: DC

## 2021-02-12 NOTE — Progress Notes (Signed)
   02/12/21 1431  PT Visit Information  Last PT Received On 02/12/21  Assistance Needed +1  Pt seen to review TKA HEP, importance of knee ROM and activity progression. Tol well. Pt is ready to d/c with family assist from PT standpoint  History of Present Illness 75 yo female s/p R TKA. PMH: laminectomy L1-2, laminectomy T10-11. PMH: PVCs, L TKA, Wolff Parkinson White syndrome, ACDF 2018  Subjective Data  Patient Stated Goal to go home today  Precautions  Precautions Knee;Fall  Restrictions  Other Position/Activity Restrictions WBAT  Pain Assessment  Pain Assessment 0-10  Pain Score 5  Pain Location right knee  Pain Descriptors / Indicators Sore  Pain Intervention(s) Limited activity within patient's tolerance;Monitored during session;Premedicated before session  Cognition  Arousal/Alertness Awake/alert  Behavior During Therapy WFL for tasks assessed/performed  Overall Cognitive Status Within Functional Limits for tasks assessed  Bed Mobility  General bed mobility comments in chair  Total Joint Exercises  Ankle Circles/Pumps AROM;Both;10 reps  Quad Sets AROM;Both;10 reps  Gluteal Sets Both;10 reps;Strengthening  Short Arc Quad AROM;Right;10 reps  Heel Slides AAROM;10 reps;Right  Hip ABduction/ADduction AROM;Strengthening;Right;10 reps  Straight Leg Raises AROM;Right;10 reps  Goniometric ROM grossly 10 to 90 degrees AROM R knee  PT - End of Session  Equipment Utilized During Treatment Gait belt  Activity Tolerance Patient tolerated treatment well  Patient left in chair;with call bell/phone within reach;with chair alarm set  Nurse Communication Mobility status   PT - Assessment/Plan  PT Plan Current plan remains appropriate  PT Visit Diagnosis Other abnormalities of gait and mobility (R26.89)  PT Frequency (ACUTE ONLY) 7X/week  Follow Up Recommendations Follow physician's recommendations for discharge plan and follow up therapies  Assistance recommended at discharge Frequent  or constant Supervision/Assistance  PT equipment None recommended by PT  AM-PAC PT "6 Clicks" Mobility Outcome Measure (Version 2)  Help needed turning from your back to your side while in a flat bed without using bedrails? 3  Help needed moving from lying on your back to sitting on the side of a flat bed without using bedrails? 3  Help needed moving to and from a bed to a chair (including a wheelchair)? 3  Help needed standing up from a chair using your arms (e.g., wheelchair or bedside chair)? 3  Help needed to walk in hospital room? 3  Help needed climbing 3-5 steps with a railing?  3  6 Click Score 18  Consider Recommendation of Discharge To: Home with [facility]  Acute Rehab PT Goals  PT Goal Formulation With patient  Time For Goal Achievement 02/19/21  Potential to Achieve Goals Good  PT Time Calculation  PT Start Time (ACUTE ONLY) 1201  PT Stop Time (ACUTE ONLY) 1215  PT Time Calculation (min) (ACUTE ONLY) 14 min  PT General Charges  $$ ACUTE PT VISIT 1 Visit  PT Treatments  $Therapeutic Exercise 8-22 mins

## 2021-02-12 NOTE — Progress Notes (Signed)
Subjective: 1 Day Post-Op Procedure(s) (LRB): TOTAL KNEE ARTHROPLASTY (Right) Patient reports pain as mild.   Patient seen in rounds with Dr. Alvan Dame. Patient is well, and has had no acute complaints or problems. No acute events overnight. Foley catheter removed. Patient did not get up with PT yet.  We will start therapy today.   Objective: Vital signs in last 24 hours: Temp:  [97.9 F (36.6 C)-98.4 F (36.9 C)] 98.1 F (36.7 C) (12/07 0650) Pulse Rate:  [58-115] 63 (12/07 0650) Resp:  [13-25] 18 (12/07 0650) BP: (104-138)/(59-93) 107/62 (12/07 0650) SpO2:  [92 %-100 %] 97 % (12/07 0650) Weight:  [69.4 kg] 69.4 kg (12/06 0750)  Intake/Output from previous day:  Intake/Output Summary (Last 24 hours) at 02/12/2021 0744 Last data filed at 02/12/2021 0650 Gross per 24 hour  Intake 3282.21 ml  Output 3000 ml  Net 282.21 ml     Intake/Output this shift: No intake/output data recorded.  Labs: Recent Labs    02/12/21 0322  HGB 9.1*   Recent Labs    02/12/21 0322  WBC 7.7  RBC 3.12*  HCT 28.5*  PLT 241   Recent Labs    02/12/21 0322  NA 141  K 3.9  CL 108  CO2 27  BUN 16  CREATININE 0.71  GLUCOSE 105*  CALCIUM 8.5*   No results for input(s): LABPT, INR in the last 72 hours.  Exam: General - Patient is Alert and Oriented Extremity - Neurologically intact Sensation intact distally Intact pulses distally Dorsiflexion/Plantar flexion intact Dressing - dressing C/D/I Motor Function - intact, moving foot and toes well on exam.   Past Medical History:  Diagnosis Date   Arthritis    Body mass index (BMI) 26.0-26.9, adult 06/20/2020   Cardiac arrhythmia 03/28/2020   Cardiac murmur 05/29/2020   Carpal tunnel syndrome 06/07/2018   Cervical myelopathy (HCC) 11/05/2016   Cervical radiculopathy 10/06/2016   Chronic pain of left knee 11/17/2018   Depression    Dysrhythmia    Encounter for monitoring flecainide therapy 05/29/2020   GERD (gastroesophageal reflux  disease)    History of total knee arthroplasty 12/26/2019   Hyperlipidemia 11/05/2016   Hypothyroidism    Myelopathy of cervical spinal cord with cervical radiculopathy (HCC) 11/05/2016   Neuropathy 05/31/2018   Oropharyngeal dysphagia 12/01/2016   Preoperative cardiovascular examination 11/21/2019   PVC (premature ventricular contraction) 11/05/2016   PVC's (premature ventricular contractions)    S/P total knee arthroplasty, left 12/26/2019   Skin cancer of face    Spinal stenosis 07/17/2020   Spinal stenosis in cervical region 10/06/2016   Ulnar neuropathy of both upper extremities 06/20/2020   Ventricular premature beats 11/05/2016   Wolff-Parkinson-White (WPW) pattern 11/05/2016   Wolff-Parkinson-White (WPW) syndrome    WPW (Wolff-Parkinson-White syndrome) 11/05/2016    Assessment/Plan: 1 Day Post-Op Procedure(s) (LRB): TOTAL KNEE ARTHROPLASTY (Right) Principal Problem:   S/P total knee arthroplasty, right  Estimated body mass index is 29.39 kg/m as calculated from the following:   Height as of this encounter: 5' 0.5" (1.537 m).   Weight as of this encounter: 69.4 kg. Advance diet Up with therapy D/C IV fluids   Patient's anticipated LOS is less than 2 midnights, meeting these requirements: - Younger than 36 - Lives within 1 hour of care - Has a competent adult at home to recover with post-op recover - NO history of  - Chronic pain requiring opiods  - Diabetes  - Coronary Artery Disease  - Heart failure  -  Heart attack  - Stroke  - DVT/VTE  - Cardiac arrhythmia  - Respiratory Failure/COPD  - Renal failure  - Anemia  - Advanced Liver disease     DVT Prophylaxis - Xarelto due to aspirin intolerance with palpitations Weight bearing as tolerated.  Plan is to go Home after hospital stay. Plan for discharge today following 1-2 sessions of PT as long as they are meeting their goals. Patient is scheduled for OPPT. Follow up in the office in 2 weeks.   Griffith Citron,  PA-C Orthopedic Surgery [phone] 02/12/2021, 7:44 AM

## 2021-02-12 NOTE — TOC Transition Note (Signed)
Transition of Care [facility]) - CM/SW Discharge Note   Patient Details  Name: Guenevere Roorda MRN: [id number] Date of Birth: 03-15-1945  [facility] of [facility] CM/SW Contact:  Lennart Pall, LCSW Phone Number: 02/12/2021, 10:06 AM   Clinical Narrative:    Met with pt and confirming she has all needed DME at home.  Plan for OPPT at [facility] in [place].  No TOC needs.   Final next level of care: OP Rehab Barriers to Discharge: No Barriers Identified   Patient Goals and CMS Choice Patient states their goals for this hospitalization and ongoing recovery are:: return home      Discharge Placement                       Discharge Plan and Services                DME Arranged: N/A DME Agency: NA       HH Arranged: NA HH Agency: NA        Social Determinants of Health (SDOH) Interventions     Readmission Risk Interventions No flowsheet data found.

## 2021-02-12 NOTE — Evaluation (Signed)
Physical Therapy Evaluation Patient Details Name: Laura Singleton MRN: [id number] DOB: [birth year] Today's Date: 02/12/2021  History of Present Illness  75 yo female s/p R TKA. PMH: laminectomy L1-2, laminectomy T10-11. PMH: PVCs, L TKA, Wolff Parkinson White syndrome, ACDF 2018  Clinical Impression  Pt is s/p TKA resulting in the deficits listed below (see PT Problem List).  PT doing exceptionally well today. Will see to review TKA HEP and pt should be ready to d/c later today  Pt will benefit from skilled PT to increase their independence and safety with mobility to allow discharge to the venue listed below.         Recommendations for follow up therapy are one component of a multi-disciplinary discharge planning process, led by the attending physician.  Recommendations may be updated based on patient status, additional functional criteria and insurance authorization.  Follow Up Recommendations Follow physician's recommendations for discharge plan and follow up therapies    Assistance Recommended at Discharge Frequent or constant Supervision/Assistance  Functional Status Assessment Patient has had a recent decline in their functional status and demonstrates the ability to make significant improvements in function in a reasonable and predictable amount of time.  Equipment Recommendations  None recommended by PT    Recommendations for Other Services       Precautions / Restrictions Precautions Precautions: Knee;Fall Restrictions Weight Bearing Restrictions: No      Mobility  Bed Mobility Overal bed mobility: Needs Assistance Bed Mobility: Supine to Sit     Supine to sit: Supervision     General bed mobility comments: for safety    Transfers Overall transfer level: Needs assistance Equipment used: Rolling walker (2 wheels) Transfers: Sit to/from Stand Sit to Stand: Supervision;Min guard           General transfer comment: cues for hand placement and RLE position     Ambulation/Gait Ambulation/Gait assistance: Min guard;Supervision Gait Distance (Feet): 140 Feet Assistive device: Rolling walker (2 wheels) Gait Pattern/deviations: Step-to pattern;Step-through pattern       General Gait Details: cues for sequence and RW position. good gait stability with RW use, no LOB  Stairs            Wheelchair Mobility    Modified Rankin (Stroke Patients Only)       Balance                                             Pertinent Vitals/Pain Pain Assessment: 0-10 Pain Score: 4  Pain Location: right knee Pain Descriptors / Indicators: Sore Pain Intervention(s): Premedicated before session;Monitored during session;Limited activity within patient's tolerance;Repositioned    Home Living Family/patient expects to be discharged to:: Private residence Living Arrangements: Alone Available Help at Discharge: Family Type of Home: House Home Access: Ramped entrance       Home Layout: One level Home Equipment: Conservation officer, nature (2 wheels);Cane - single point;Rollator (4 wheels);Wheelchair - manual Additional Comments: pt son staying with pt initially    Prior Function Prior Level of Function : Independent/Modified Independent                     Hand Dominance        Extremity/Trunk Assessment   Upper Extremity Assessment Upper Extremity Assessment: Overall WFL for tasks assessed    Lower Extremity Assessment Lower Extremity Assessment: RLE deficits/detail RLE Deficits / Details: ankle WFL,  knee extension and hip flexion 2+ to 3/5       Communication   Communication: No difficulties  Cognition Arousal/Alertness: Awake/alert Behavior During Therapy: WFL for tasks assessed/performed Overall Cognitive Status: Within Functional Limits for tasks assessed                                          General Comments      Exercises Total Joint Exercises Ankle Circles/Pumps: AROM;Both;10  reps Quad Sets: Both;AROM;5 reps   Assessment/Plan    PT Assessment Patient needs continued PT services  PT Problem List Decreased strength;Decreased range of motion;Decreased mobility;Decreased activity tolerance;Pain;Decreased knowledge of use of DME       PT Treatment Interventions DME instruction;Therapeutic activities;Gait training;Functional mobility training;Therapeutic exercise;Patient/family education;Stair training    PT Goals (Current goals can be found in the Care Plan section)  Acute Rehab PT Goals Patient Stated Goal: to go home today PT Goal Formulation: With patient Time For Goal Achievement: 02/19/21 Potential to Achieve Goals: Good    Frequency 7X/week   Barriers to discharge        Co-evaluation               AM-PAC PT "6 Clicks" Mobility  Outcome Measure Help needed turning from your back to your side while in a flat bed without using bedrails?: A Little Help needed moving from lying on your back to sitting on the side of a flat bed without using bedrails?: A Little Help needed moving to and from a bed to a chair (including a wheelchair)?: A Little Help needed standing up from a chair using your arms (e.g., wheelchair or bedside chair)?: A Little Help needed to walk in hospital room?: A Little Help needed climbing 3-5 steps with a railing? : A Little 6 Click Score: 18    End of Session Equipment Utilized During Treatment: Gait belt Activity Tolerance: Patient tolerated treatment well Patient left: with call bell/phone within reach;in bed Nurse Communication: Mobility status PT Visit Diagnosis: Other abnormalities of gait and mobility (R26.89)    Time: 2353-6144 PT Time Calculation (min) (ACUTE ONLY): 17 min   Charges:   PT Evaluation $PT Eval Low Complexity: [place], PT  Acute Rehab Dept ([place]) [phone] Pager [phone]  02/12/2021   [facility] 02/12/2021, 12:34 PM

## 2021-02-13 ENCOUNTER — Encounter (HOSPITAL_COMMUNITY): Payer: Self-pay | Admitting: Orthopedic Surgery

## 2021-02-14 DIAGNOSIS — M25561 Pain in right knee: Secondary | ICD-10-CM | POA: Diagnosis not present

## 2021-02-14 DIAGNOSIS — R260 Ataxic gait: Secondary | ICD-10-CM | POA: Diagnosis not present

## 2021-02-14 DIAGNOSIS — M6281 Muscle weakness (generalized): Secondary | ICD-10-CM | POA: Diagnosis not present

## 2021-02-17 ENCOUNTER — Ambulatory Visit: Payer: Medicare HMO | Admitting: Cardiology

## 2021-02-17 DIAGNOSIS — M25561 Pain in right knee: Secondary | ICD-10-CM | POA: Diagnosis not present

## 2021-02-17 DIAGNOSIS — R260 Ataxic gait: Secondary | ICD-10-CM | POA: Diagnosis not present

## 2021-02-17 DIAGNOSIS — M6281 Muscle weakness (generalized): Secondary | ICD-10-CM | POA: Diagnosis not present

## 2021-02-18 NOTE — Discharge Summary (Signed)
Physician Discharge Summary   Patient ID: Laura Singleton MRN: [id number] DOB/AGE: 1945-11-11 75 y.o.  Admit date: 02/11/2021 Discharge date: 02/12/2021  Primary Diagnosis: Right knee osteoarthritis.   Admission Diagnoses:  Past Medical History:  Diagnosis Date   Arthritis    Body mass index (BMI) 26.0-26.9, adult 06/20/2020   Cardiac arrhythmia 03/28/2020   Cardiac murmur 05/29/2020   Carpal tunnel syndrome 06/07/2018   Cervical myelopathy ([place]) 11/05/2016   Cervical radiculopathy 10/06/2016   Chronic pain of left knee 11/17/2018   Depression    Dysrhythmia    Encounter for monitoring flecainide therapy 05/29/2020   GERD (gastroesophageal reflux disease)    History of total knee arthroplasty 12/26/2019   Hyperlipidemia 11/05/2016   Hypothyroidism    Myelopathy of cervical spinal cord with cervical radiculopathy (HCC) 11/05/2016   Neuropathy 05/31/2018   Oropharyngeal dysphagia 12/01/2016   Preoperative cardiovascular examination 11/21/2019   PVC (premature ventricular contraction) 11/05/2016   PVC's (premature ventricular contractions)    S/P total knee arthroplasty, left 12/26/2019   Skin cancer of face    Spinal stenosis 07/17/2020   Spinal stenosis in cervical region 10/06/2016   Ulnar neuropathy of both upper extremities 06/20/2020   Ventricular premature beats 11/05/2016   Wolff-Parkinson-White (WPW) pattern 11/05/2016   Wolff-Parkinson-White (WPW) syndrome    WPW (Wolff-Parkinson-White syndrome) 11/05/2016   Discharge Diagnoses:   Principal Problem:   S/P total knee arthroplasty, right  Estimated body mass index is 29.39 kg/m as calculated from the following:   Height as of this encounter: 5' 0.5" (1.537 m).   Weight as of this encounter: 69.4 kg.  Procedure:  Procedure(s) (LRB): TOTAL KNEE ARTHROPLASTY (Right)   Consults: None  HPI: Laura Singleton is a 75 y.o. female patient of   mine.  The patient had been seen, evaluated, and treated for months conservatively in the   office  with medication, activity modification, and injections.  The patient had   radiographic changes of bone-on-bone arthritis with endplate sclerosis and osteophytes noted.  Based on the radiographic changes and failed conservative measures, the patient   decided to proceed with definitive treatment, total knee replacement.  Risks of infection, DVT, component failure, need for revision surgery, neurovascular injury were reviewed in the office setting.  The postop course was reviewed stressing the efforts to maximize post-operative satisfaction and function.  Consent was obtained for benefit of pain   relief.   Laboratory Data: Admission on 02/11/2021, Discharged on 02/12/2021  Component Date Value Ref Range Status   WBC 02/12/2021 7.7  4.0 - 10.5 K/uL Final   RBC 02/12/2021 3.12 (L)  3.87 - 5.11 MIL/uL Final   Hemoglobin 02/12/2021 9.1 (L)  12.0 - 15.0 g/dL Final   HCT 02/12/2021 28.5 (L)  36.0 - 46.0 % Final   MCV 02/12/2021 91.3  80.0 - 100.0 fL Final   MCH 02/12/2021 29.2  26.0 - 34.0 pg Final   MCHC 02/12/2021 31.9  30.0 - 36.0 g/dL Final   RDW 02/12/2021 14.6  11.5 - 15.5 % Final   Platelets 02/12/2021 241  150 - 400 K/uL Final   nRBC 02/12/2021 0.0  0.0 - 0.2 % Final   Performed at [facility], [place] [address], [place], [place] [address]   Sodium 02/12/2021 141  135 - 145 mmol/L Final   Potassium 02/12/2021 3.9  3.5 - 5.1 mmol/L Final   Chloride 02/12/2021 108  98 - 111 mmol/L Final   CO2 02/12/2021 27  22 - 32 mmol/L  Final   Glucose, Bld 02/12/2021 105 (H)  70 - 99 mg/dL Final   Glucose reference range applies only to samples taken after fasting for at least 8 hours.   BUN 02/12/2021 16  8 - 23 mg/dL Final   Creatinine, Ser 02/12/2021 0.71  0.44 - 1.00 mg/dL Final   Calcium 02/12/2021 8.5 (L)  8.9 - 10.3 mg/dL Final   GFR, Estimated 02/12/2021 >60  >60 mL/min Final   Comment: (NOTE) Calculated using the CKD-EPI Creatinine Equation (2021)    Anion gap 02/12/2021 6   5 - 15 Final   Performed at [facility], [place] [address], [place], [place] [address]  Orders Only on 02/07/2021  Component Date Value Ref Range Status   SARS Coronavirus 2 02/07/2021 RESULT: NEGATIVE   Final   Comment: RESULT: NEGATIVESARS-CoV-2 INTERPRETATION:A NEGATIVE  test result means that SARS-CoV-2 RNA was not present in the specimen above the limit of detection of this test. This does not preclude a possible SARS-CoV-2 infection and should not be used as the  sole basis for patient management decisions. Negative results must be combined with clinical observations, patient history, and epidemiological information. Optimum specimen types and timing for peak viral levels during infections caused by SARS-CoV-2  have not been determined. Collection of multiple specimens or types of specimens may be necessary to detect virus. Improper specimen collection and handling, sequence variability under primers/probes, or organism present below the limit of detection may  lead to false negative results. Positive and negative predictive values of testing are highly dependent on prevalence. False negative test results are more likely when prevalence of disease is high.The expected result is NEGATIVE.Fact S                          heet for  Healthcare Providers: LocalChronicle.no Sheet for Patients: SalonLookup.es Reference Range - Negative   Hospital Outpatient Visit on 01/29/2021  Component Date Value Ref Range Status   MRSA, PCR 01/29/2021 NEGATIVE  NEGATIVE Final   Staphylococcus aureus 01/29/2021 NEGATIVE  NEGATIVE Final   Comment: (NOTE) The Xpert SA Assay (FDA approved for NASAL specimens in patients 66 years of age and older), is one component of a comprehensive surveillance program. It is not intended to diagnose infection nor to guide or monitor treatment. Performed at [facility], [place]  [address], [place], [place] [address]    WBC 01/29/2021 6.8  4.0 - 10.5 K/uL Final   RBC 01/29/2021 4.14  3.87 - 5.11 MIL/uL Final   Hemoglobin 01/29/2021 12.1  12.0 - 15.0 g/dL Final   HCT 01/29/2021 37.9  36.0 - 46.0 % Final   MCV 01/29/2021 91.5  80.0 - 100.0 fL Final   MCH 01/29/2021 29.2  26.0 - 34.0 pg Final   MCHC 01/29/2021 31.9  30.0 - 36.0 g/dL Final   RDW 01/29/2021 14.7  11.5 - 15.5 % Final   Platelets 01/29/2021 335  150 - 400 K/uL Final   nRBC 01/29/2021 0.0  0.0 - 0.2 % Final   Performed at [facility], [place] [address] [place], [place] [address]   Sodium 01/29/2021 142  135 - 145 mmol/L Final   Potassium 01/29/2021 4.4  3.5 - 5.1 mmol/L Final   Chloride 01/29/2021 106  98 - 111 mmol/L Final   CO2 01/29/2021 30  22 - 32 mmol/L Final   Glucose, Bld 01/29/2021 118 (H)  70 - 99 mg/dL Final  Glucose reference range applies only to samples taken after fasting for at least 8 hours.   BUN 01/29/2021 16  8 - 23 mg/dL Final   Creatinine, Ser 01/29/2021 0.81  0.44 - 1.00 mg/dL Final   Calcium 01/29/2021 9.7  8.9 - 10.3 mg/dL Final   Total Protein 01/29/2021 6.7  6.5 - 8.1 g/dL Final   Albumin 01/29/2021 4.1  3.5 - 5.0 g/dL Final   AST 01/29/2021 25  15 - 41 U/L Final   ALT 01/29/2021 19  0 - 44 U/L Final   Alkaline Phosphatase 01/29/2021 48  38 - 126 U/L Final   Total Bilirubin 01/29/2021 0.4  0.3 - 1.2 mg/dL Final   GFR, Estimated 01/29/2021 >60  >60 mL/min Final   Comment: (NOTE) Calculated using the CKD-EPI Creatinine Equation (2021)    Anion gap 01/29/2021 6  5 - 15 Final   Performed at [facility], [place] [address] [place], [place] [address]   ABO/RH(D) 01/29/2021 A POS   Final   Antibody Screen 01/29/2021 NEG   Final   Sample Expiration 01/29/2021 02/12/2021,2359   Final   Extend sample reason 01/29/2021    Final                   Value:NO TRANSFUSIONS OR PREGNANCY IN THE PAST 3 MONTHS Performed at [place] [address], [place], [place] [address]      X-Rays:No results found.  EKG: Orders placed or performed in visit on 11/20/20   EKG 12-Lead     Hospital Course: Genene Kilman is a 75 y.o. who was admitted to [facility]. They were brought to the operating room on 02/11/2021 and underwent Procedure(s): TOTAL KNEE ARTHROPLASTY.  Patient tolerated the procedure well and was later transferred to the recovery room and then to the orthopaedic floor for postoperative care. They were given PO and IV analgesics for pain control following their surgery. They were given 24 hours of postoperative antibiotics of  Anti-infectives (From admission, onward)    Start     Dose/Rate Route Frequency Ordered Stop   02/11/21 1600  ceFAZolin (ANCEF) IVPB 2g/100 mL premix        2 g 200 mL/hr over 30 Minutes Intravenous Every 6 hours 02/11/21 1317 02/11/21 2205   02/11/21 0745  ceFAZolin (ANCEF) IVPB 2g/100 mL premix        2 g 200 mL/hr over 30 Minutes Intravenous On call to O.R. 02/11/21 0733 02/11/21 1022   02/11/21 0743  ceFAZolin (ANCEF) 2-4 GM/100ML-% IVPB       Note to Pharmacy: Randa Evens D: cabinet override      02/11/21 0743 02/11/21 1024      and started on DVT prophylaxis in the form of Xarelto.   PT and OT were ordered for total joint protocol. Discharge planning consulted to help with postop disposition and equipment needs.  Patient had a good night on the evening of surgery. They started to get up OOB with therapy on POD #1. Pt was seen during rounds and was ready to go home pending progress with therapy.She worked with therapy on POD #1 and was meeting her goals. Pt was discharged to home later that day in stable condition.  Diet: Regular diet Activity: WBAT Follow-up: in 2 weeks Disposition: Home Discharged Condition: good   Discharge Instructions     Call MD / Call 911   Complete by: As directed    If you experience chest  pain or shortness of breath, CALL 911 and be  transported to the hospital emergency room.  If you develope a fever above 101 F, pus (white drainage) or increased drainage or redness at the wound, or calf pain, call your surgeon's office.   Change dressing   Complete by: As directed    Maintain surgical dressing until follow up in the clinic. If the edges start to pull up, may reinforce with tape. If the dressing is no longer working, may remove and cover with gauze and tape, but must keep the area dry and clean.  Call with any questions or concerns.   Constipation Prevention   Complete by: As directed    Drink plenty of fluids.  Prune juice may be helpful.  You may use a stool softener, such as Colace (over the counter) 100 mg twice a day.  Use MiraLax (over the counter) for constipation as needed.   Diet - low sodium heart healthy   Complete by: As directed    Increase activity slowly as tolerated   Complete by: As directed    Weight bearing as tolerated with assist device (walker, cane, etc) as directed, use it as long as suggested by your surgeon or therapist, typically at least 4-6 weeks.   Post-operative opioid taper instructions:   Complete by: As directed    POST-OPERATIVE OPIOID TAPER INSTRUCTIONS: It is important to wean off of your opioid medication as soon as possible. If you do not need pain medication after your surgery it is ok to stop day one. Opioids include: Codeine, Hydrocodone(Norco, Vicodin), Oxycodone(Percocet, oxycontin) and hydromorphone amongst others.  Long term and even short term use of opiods can cause: Increased pain response Dependence Constipation Depression Respiratory depression And more.  Withdrawal symptoms can include Flu like symptoms Nausea, vomiting And more Techniques to manage these symptoms Hydrate well Eat regular healthy meals Stay active Use relaxation techniques(deep breathing, meditating, yoga) Do Not substitute Alcohol to help with tapering If you have been on opioids for less  than two weeks and do not have pain than it is ok to stop all together.  Plan to wean off of opioids This plan should start within one week post op of your joint replacement. Maintain the same interval or time between taking each dose and first decrease the dose.  Cut the total daily intake of opioids by one tablet each day Next start to increase the time between doses. The last dose that should be eliminated is the evening dose.      TED hose   Complete by: As directed    Use stockings (TED hose) for 2 weeks on both leg(s).  You may remove them at night for sleeping.      Allergies as of 02/12/2021       Reactions   Bee Venom Itching, Swelling, Other (See Comments)   Passed out   Parafon Forte Dsc [chlorzoxazone] Swelling, Other (See Comments)   Lips swelling and lips felt sunburned.   Sulfa Antibiotics Hives, Itching        Medication List     TAKE these medications    atorvastatin 20 MG tablet Commonly known as: LIPITOR Take 20 mg by mouth at bedtime.   calcium carbonate 600 MG Tabs tablet Commonly known as: OS-CAL Take 1,200 mg by mouth at bedtime.   cyclobenzaprine 10 MG tablet Commonly known as: FLEXERIL Take 1 tablet (10 mg total) by mouth 3 (three) times daily as needed for muscle spasms. What changed:  when to take this reasons to take this   docusate sodium 100 MG capsule Commonly known as: COLACE Take 1 capsule (100 mg total) by mouth 2 (two) times daily.   flecainide 100 MG tablet Commonly known as: TAMBOCOR Take 100 mg by mouth 2 (two) times daily.   HYDROcodone-acetaminophen 5-325 MG tablet Commonly known as: NORCO/VICODIN Take 1-2 tablets by mouth every 4 (four) hours as needed for severe pain.   levothyroxine 112 MCG tablet Commonly known as: SYNTHROID Take 112 mcg by mouth daily before breakfast.   multivitamin with minerals Tabs tablet Take 1 tablet by mouth daily. Centrum Silver   omeprazole 40 MG capsule Commonly known as:  PRILOSEC Take 40 mg by mouth daily.   rivaroxaban 10 MG Tabs tablet Commonly known as: XARELTO Take 1 tablet (10 mg total) by mouth daily with breakfast for 14 days.   ropinirole 5 MG tablet Commonly known as: REQUIP Take 5 mg by mouth at bedtime.   sertraline 100 MG tablet Commonly known as: ZOLOFT Take 200 mg by mouth daily.   traZODone 100 MG tablet Commonly known as: DESYREL Take 100 mg by mouth at bedtime.   TURMERIC CURCUMIN PO Take 1 capsule by mouth daily.               Discharge Care Instructions  (From admission, onward)           Start     Ordered   02/12/21 0000  Change dressing       Comments: Maintain surgical dressing until follow up in the clinic. If the edges start to pull up, may reinforce with tape. If the dressing is no longer working, may remove and cover with gauze and tape, but must keep the area dry and clean.  Call with any questions or concerns.   02/12/21 0749            Follow-up Information     Paralee Cancel, MD. Schedule an appointment as soon as possible for a visit in 2 week(s).   Specialty: Orthopedic Surgery Contact information: [address] [place] [address] [phone]                 Signed: Griffith Citron, PA-C Orthopedic Surgery 02/18/2021, 3:05 PM

## 2021-02-19 DIAGNOSIS — R260 Ataxic gait: Secondary | ICD-10-CM | POA: Diagnosis not present

## 2021-02-19 DIAGNOSIS — M6281 Muscle weakness (generalized): Secondary | ICD-10-CM | POA: Diagnosis not present

## 2021-02-19 DIAGNOSIS — M25561 Pain in right knee: Secondary | ICD-10-CM | POA: Diagnosis not present

## 2021-02-24 DIAGNOSIS — R260 Ataxic gait: Secondary | ICD-10-CM | POA: Diagnosis not present

## 2021-02-24 DIAGNOSIS — M6281 Muscle weakness (generalized): Secondary | ICD-10-CM | POA: Diagnosis not present

## 2021-02-24 DIAGNOSIS — M25561 Pain in right knee: Secondary | ICD-10-CM | POA: Diagnosis not present

## 2021-03-20 DIAGNOSIS — J40 Bronchitis, not specified as acute or chronic: Secondary | ICD-10-CM | POA: Diagnosis not present

## 2021-03-20 DIAGNOSIS — Z6823 Body mass index (BMI) 23.0-23.9, adult: Secondary | ICD-10-CM | POA: Diagnosis not present

## 2021-03-20 DIAGNOSIS — J329 Chronic sinusitis, unspecified: Secondary | ICD-10-CM | POA: Diagnosis not present

## 2021-03-20 DIAGNOSIS — R32 Unspecified urinary incontinence: Secondary | ICD-10-CM | POA: Diagnosis not present

## 2021-03-21 DIAGNOSIS — J329 Chronic sinusitis, unspecified: Secondary | ICD-10-CM | POA: Diagnosis not present

## 2021-03-21 DIAGNOSIS — R32 Unspecified urinary incontinence: Secondary | ICD-10-CM | POA: Diagnosis not present

## 2021-03-26 DIAGNOSIS — M1711 Unilateral primary osteoarthritis, right knee: Secondary | ICD-10-CM | POA: Diagnosis not present

## 2021-04-29 ENCOUNTER — Encounter: Payer: Self-pay | Admitting: Cardiology

## 2021-04-29 ENCOUNTER — Ambulatory Visit: Payer: Medicare HMO | Admitting: Cardiology

## 2021-04-29 ENCOUNTER — Other Ambulatory Visit: Payer: Self-pay

## 2021-04-29 VITALS — BP 126/64 | HR 62 | Ht 60.6 in | Wt 144.8 lb

## 2021-04-29 DIAGNOSIS — I493 Ventricular premature depolarization: Secondary | ICD-10-CM

## 2021-04-29 DIAGNOSIS — I456 Pre-excitation syndrome: Secondary | ICD-10-CM | POA: Diagnosis not present

## 2021-04-29 NOTE — Progress Notes (Signed)
Electrophysiology Office Note   Date:  04/29/2021   ID:  Cary Wilford, DOB 1945/03/28, MRN [id number]  PCP:  [name], MD  Cardiologist:  Revankar Primary Electrophysiologist:  Jeter Tomey Meredith Leeds, MD    Chief Complaint: PVC   History of Present Illness: Laura Singleton is a 76 y.o. female who is being seen today for the evaluation of PVC at the request of [name], MD. Presenting today for electrophysiology evaluation.  She has a history seen for PVCs and Wolff-Parkinson-White syndrome.  She was initiated on flecainide ago and tolerated well.  WPW was diagnosed by Dr. Caryl Comes who put her on the flecainide.  She was offered ablation at [facility] but wished to avoid invasive procedures.  Today, denies symptoms of palpitations, chest pain, shortness of breath, orthopnea, PND, lower extremity edema, claudication, dizziness, presyncope, syncope, bleeding, or neurologic sequela. The patient is tolerating medications without difficulties.  She has been feeling well.  She has no chest pain or shortness of breath.  She has noted no further palpitations.  She did have a right knee replacement in December.  She did well with that surgery.  She is also doing well with rehab.   Past Medical History:  Diagnosis Date   Arthritis    Body mass index (BMI) 26.0-26.9, adult 06/20/2020   Cardiac arrhythmia 03/28/2020   Cardiac murmur 05/29/2020   Carpal tunnel syndrome 06/07/2018   Cervical myelopathy ([place]) 11/05/2016   Cervical radiculopathy 10/06/2016   Chronic pain of left knee 11/17/2018   Depression    Dysrhythmia    Encounter for monitoring flecainide therapy 05/29/2020   GERD (gastroesophageal reflux disease)    History of total knee arthroplasty 12/26/2019   Hyperlipidemia 11/05/2016   Hypothyroidism    Myelopathy of cervical spinal cord with cervical radiculopathy (HCC) 11/05/2016   Neuropathy 05/31/2018   Oropharyngeal dysphagia 12/01/2016   Preoperative cardiovascular examination 11/21/2019    PVC (premature ventricular contraction) 11/05/2016   PVC's (premature ventricular contractions)    S/P total knee arthroplasty, left 12/26/2019   Skin cancer of face    Spinal stenosis 07/17/2020   Spinal stenosis in cervical region 10/06/2016   Ulnar neuropathy of both upper extremities 06/20/2020   Ventricular premature beats 11/05/2016   Wolff-Parkinson-White (WPW) pattern 11/05/2016   Wolff-Parkinson-White (WPW) syndrome    WPW (Wolff-Parkinson-White syndrome) 11/05/2016   Past Surgical History:  Procedure Laterality Date   ANTERIOR CERVICAL DECOMP/DISCECTOMY FUSION N/A 11/05/2016   Procedure: ANTERIOR CERVICAL DECOMPRESSION/DISCECTOMY FUSION  - CERVICAL THREE-FOUR, CERVICAL FIVE-SIX, CERVICAL SIX -SEVEN;  Surgeon: Kary Kos, MD;  Location: [facility];  Service: Neurosurgery;  Laterality: N/A;   COLONOSCOPY     EYE SURGERY     both eyes lens implants   LUMBAR LAMINECTOMY/DECOMPRESSION MICRODISCECTOMY Bilateral 07/17/2020   Procedure: Laminectomy and Foraminotomy - bilateral - Thoracic ten-Thoracic eleven - Lumbar one-Lumbar two;  Surgeon: Kary Kos, MD;  Location: [facility];  Service: Neurosurgery;  Laterality: Bilateral;   SKIN CANCER EXCISION     Face   thumb surgery Right    TONSILLECTOMY     TOTAL KNEE ARTHROPLASTY Left 12/26/2019   Procedure: TOTAL KNEE ARTHROPLASTY;  Surgeon: Paralee Cancel, MD;  Location: [facility];  Service: Orthopedics;  Laterality: Left;  70 mins   TOTAL KNEE ARTHROPLASTY Right 02/11/2021   Procedure: TOTAL KNEE ARTHROPLASTY;  Surgeon: Paralee Cancel, MD;  Location: [facility];  Service: Orthopedics;  Laterality: Right;   TUBAL LIGATION       Current Outpatient Medications  Medication Sig  Dispense Refill   atorvastatin (LIPITOR) 20 MG tablet Take 20 mg by mouth at bedtime.     calcium carbonate (OS-CAL) 600 MG TABS tablet Take 1,200 mg by mouth at bedtime.     cyclobenzaprine (FLEXERIL) 10 MG tablet Take 1 tablet (10 mg total) by mouth 3 (three) times daily as needed  for muscle spasms. 30 tablet 0   flecainide (TAMBOCOR) 100 MG tablet Take 100 mg by mouth 2 (two) times daily.     levothyroxine (SYNTHROID) 112 MCG tablet Take 112 mcg by mouth daily before breakfast.     Multiple Vitamin (MULTIVITAMIN WITH MINERALS) TABS tablet Take 1 tablet by mouth daily. Centrum Silver     omeprazole (PRILOSEC) 40 MG capsule Take 40 mg by mouth daily.     ropinirole (REQUIP) 5 MG tablet Take 5 mg by mouth at bedtime.     sertraline (ZOLOFT) 100 MG tablet Take 200 mg by mouth daily.     traZODone (DESYREL) 100 MG tablet Take 100 mg by mouth at bedtime.     TURMERIC CURCUMIN PO Take 1 capsule by mouth daily.     No current facility-administered medications for this visit.    Allergies:   Bee venom, Parafon forte dsc [chlorzoxazone], and Sulfa antibiotics   Social History:  The patient  reports that she has quit smoking. Her smoking use included cigarettes. She has never used smokeless tobacco. She reports that she does not drink alcohol and does not use drugs.   Family History:  The patient's family history includes High Cholesterol in her mother; High blood pressure in her brother and mother.   ROS:  Please see the history of present illness.   Otherwise, review of systems is positive for none.   All other systems are reviewed and negative.   PHYSICAL EXAM: VS:  BP 126/64    Pulse 62    Ht 5' 0.6" (1.539 m)    Wt 144 lb 12.8 oz (65.7 kg)    SpO2 96%    BMI 27.72 kg/m  , BMI Body mass index is 27.72 kg/m. GEN: Well nourished, well developed, in no acute distress  HEENT: normal  Neck: no JVD, carotid bruits, or masses Cardiac: RRR; no murmurs, rubs, or gallops,no edema  Respiratory:  clear to auscultation bilaterally, normal work of breathing GI: soft, nontender, nondistended, + BS MS: no deformity or atrophy  Skin: warm and dry Neuro:  Strength and sensation are intact Psych: euthymic mood, full affect  EKG:  EKG is ordered today. Personal review of the ekg  ordered shows sinus rhythm   Recent Labs: 01/29/2021: ALT 19 02/12/2021: BUN 16; Creatinine, Ser 0.71; Hemoglobin 9.1; Platelets 241; Potassium 3.9; Sodium 141    Lipid Panel  No results found for: CHOL, TRIG, HDL, CHOLHDL, VLDL, LDLCALC, LDLDIRECT   Wt Readings from Last 3 Encounters:  04/29/21 144 lb 12.8 oz (65.7 kg)  02/11/21 153 lb (69.4 kg)  01/29/21 153 lb (69.4 kg)      Other studies Reviewed: Additional studies/ records that were reviewed today include: TTE 06/19/20  Review of the above records today demonstrates:   1. Left ventricular ejection fraction, by estimation, is 60 to 65%. The  left ventricle has normal function. The left ventricle has no regional  wall motion abnormalities. There is mild left ventricular hypertrophy.  Left ventricular diastolic parameters  are consistent with Grade I diastolic dysfunction (impaired relaxation).   2. Right ventricular systolic function is normal. The right ventricular  size  is normal. There is normal pulmonary artery systolic pressure.   3. The mitral valve is normal in structure. No evidence of mitral valve  regurgitation. No evidence of mitral stenosis.   4. The aortic valve is normal in structure. Aortic valve regurgitation is  not visualized. No aortic stenosis is present.   5. The inferior vena cava is normal in size with greater than 50%  respiratory variability, suggesting right atrial pressure of 3 mmHg.   Myoview 12/05/19 The left ventricular ejection fraction is normal (55-65%). Nuclear stress EF: 64%. There was no ST segment deviation noted during stress. No T wave inversion was noted during stress. The study is normal. This is a low risk study.    ASSESSMENT AND PLAN:  1.  PVCs: Currently on flecainide 100 mg twice daily.  High risk medication monitoring via ECG.  No PVCs on her ECG today.  She is feeling well.  No changes.  2.  Hyperlipidemia: Continue statin per primary physician and primary  cardiology  3.  Wolff-Parkinson-White: Saw Caryl Comes in 2012 and was put on flecainide.  No current ventricular preexcitation.  Current medicines are reviewed at length with the patient today.   The patient does not have concerns regarding her medicines.  The following changes were made today: None  Labs/ tests ordered today include:  Orders Placed This Encounter  Procedures   EKG 12-Lead     Disposition:   FU with Dreamer Carillo 12 months  Signed, Kjersten Ormiston Meredith Leeds, MD  04/29/2021 10:48 AM     [place] [address] [phone] (office) [phone] (fax)

## 2021-05-06 DIAGNOSIS — Z1231 Encounter for screening mammogram for malignant neoplasm of breast: Secondary | ICD-10-CM | POA: Diagnosis not present

## 2021-05-07 ENCOUNTER — Ambulatory Visit: Payer: Medicare HMO | Admitting: Cardiology

## 2021-05-07 ENCOUNTER — Other Ambulatory Visit: Payer: Self-pay

## 2021-05-07 ENCOUNTER — Encounter: Payer: Self-pay | Admitting: Cardiology

## 2021-05-07 VITALS — BP 148/76 | HR 63 | Ht 63.0 in | Wt 145.0 lb

## 2021-05-07 DIAGNOSIS — R030 Elevated blood-pressure reading, without diagnosis of hypertension: Secondary | ICD-10-CM

## 2021-05-07 DIAGNOSIS — I493 Ventricular premature depolarization: Secondary | ICD-10-CM | POA: Diagnosis not present

## 2021-05-07 DIAGNOSIS — I456 Pre-excitation syndrome: Secondary | ICD-10-CM

## 2021-05-07 HISTORY — DX: Elevated blood-pressure reading, without diagnosis of hypertension: R03.0

## 2021-05-07 NOTE — Progress Notes (Signed)
?Cardiology Office Note:   ? ?Date:  05/07/2021  ? ?Laura Singleton, DOB [birth year], MRN [id number] ? ?PCP:  [name], MD  ?Cardiologist:  Jenean Lindau, MD  ? ?Referring MD: [name], MD  ? ? ?ASSESSMENT:   ? ?1. PVC's (premature ventricular contractions)   ?2. Wolff-Parkinson-White (WPW) syndrome   ? ?PLAN:   ? ?In order of problems listed above: ? ?Primary prevention stressed with the patient.  Importance of compliance with diet medication stressed and she vocalized understanding.  She was advised to walk at least half an hour a day 5 days a week and she promises to do so. ?Wolf Parkinson White syndrome: EKG is unremarkable and she was initiated in the past on flecainide by electrophysiology colleagues and Dr. Curt Bears feels it is okay to continue this. ?PVCs: Again asymptomatic at this time patient is happy about it. ?Elevated blood pressure without a diagnosis of hypertension: Her blood pressures are fine at home.  She has an element of whitecoat hypertension and I reassured her about this. ?Patient will be seen in follow-up appointment in 6 months or earlier if the patient has any concerns.  She will have complete blood work in the next month and forwarded to me. ? ? ?Medication Adjustments/Labs and Tests Ordered: ?Current medicines are reviewed at length with the patient today.  Concerns regarding medicines are outlined above.  ?No orders of the defined types were placed in this encounter. ? ?No orders of the defined types were placed in this encounter. ? ? ? ?No chief complaint on file. ?  ? ?History of Present Illness:   ? ?Laura Singleton is a 76 y.o. female.  Patient has past medical history of Wolf Parkinson White syndrome and PVCs.  In the past she has been on flecainide for the same reason.  She denies any chest pain orthopnea PND or palpitations.  She takes care of activities of daily living.  She recently saw our colleague Dr. Curt Bears and I reviewed those notes.  She has an element of  whitecoat hypertension. ? ?Past Medical History:  ?Diagnosis Date  ? Arthritis   ? Body mass index (BMI) 26.0-26.9, adult 06/20/2020  ? Cardiac arrhythmia 03/28/2020  ? Cardiac murmur 05/29/2020  ? Carpal tunnel syndrome 06/07/2018  ? Cervical myelopathy ([place]) 11/05/2016  ? Cervical radiculopathy 10/06/2016  ? Chronic pain of left knee 11/17/2018  ? Depression   ? Dysrhythmia   ? Encounter for monitoring flecainide therapy 05/29/2020  ? GERD (gastroesophageal reflux disease)   ? History of total knee arthroplasty 12/26/2019  ? Hyperlipidemia 11/05/2016  ? Hypothyroidism   ? Myelopathy of cervical spinal cord with cervical radiculopathy ([place]) 11/05/2016  ? Neuropathy 05/31/2018  ? Oropharyngeal dysphagia 12/01/2016  ? Osteoarthritis of right knee 01/29/2021  ? Preoperative cardiovascular examination 11/21/2019  ? PVC (premature ventricular contraction) 11/05/2016  ? PVC's (premature ventricular contractions)   ? S/P total knee arthroplasty, left 12/26/2019  ? S/P total knee arthroplasty, right 02/11/2021  ? Skin cancer of face   ? Spinal stenosis 07/17/2020  ? Spinal stenosis in cervical region 10/06/2016  ? Ulnar neuropathy of both upper extremities 06/20/2020  ? Ventricular premature beats 11/05/2016  ? Wolff-Parkinson-White (WPW) pattern 11/05/2016  ? Wolff-Parkinson-White (WPW) syndrome   ? WPW (Wolff-Parkinson-White syndrome) 11/05/2016  ? ? ?Past Surgical History:  ?Procedure Laterality Date  ? ANTERIOR CERVICAL DECOMP/DISCECTOMY FUSION N/A 11/05/2016  ? Procedure: ANTERIOR CERVICAL DECOMPRESSION/DISCECTOMY FUSION  - CERVICAL THREE-FOUR, CERVICAL FIVE-SIX, CERVICAL SIX -SEVEN;  Surgeon: Kary Kos, MD;  Location: [place];  Service: Neurosurgery;  Laterality: N/A;  ? COLONOSCOPY    ? EYE SURGERY    ? both eyes lens implants  ? LUMBAR LAMINECTOMY/DECOMPRESSION MICRODISCECTOMY Bilateral 07/17/2020  ? Procedure: Laminectomy and Foraminotomy - bilateral - Thoracic ten-Thoracic eleven - Lumbar one-Lumbar two;  Surgeon: Kary Kos, MD;   Location: [place];  Service: Neurosurgery;  Laterality: Bilateral;  ? SKIN CANCER EXCISION    ? Face  ? thumb surgery Right   ? TONSILLECTOMY    ? TOTAL KNEE ARTHROPLASTY Left 12/26/2019  ? Procedure: TOTAL KNEE ARTHROPLASTY;  Surgeon: Paralee Cancel, MD;  Location: [facility];  Service: Orthopedics;  Laterality: Left;  70 mins  ? TOTAL KNEE ARTHROPLASTY Right 02/11/2021  ? Procedure: TOTAL KNEE ARTHROPLASTY;  Surgeon: Paralee Cancel, MD;  Location: [facility];  Service: Orthopedics;  Laterality: Right;  ? TUBAL LIGATION    ? ? ?Current Medications: ?Current Meds  ?Medication Sig  ? atorvastatin (LIPITOR) 20 MG tablet Take 20 mg by mouth at bedtime.  ? calcium carbonate (OS-CAL) 600 MG TABS tablet Take 1,200 mg by mouth at bedtime.  ? cyclobenzaprine (FLEXERIL) 10 MG tablet Take 1 tablet (10 mg total) by mouth 3 (three) times daily as needed for muscle spasms.  ? flecainide (TAMBOCOR) 100 MG tablet Take 100 mg by mouth 2 (two) times daily.  ? levothyroxine (SYNTHROID) 112 MCG tablet Take 112 mcg by mouth daily before breakfast.  ? Multiple Vitamin (MULTIVITAMIN WITH MINERALS) TABS tablet Take 1 tablet by mouth daily. Centrum Silver  ? omeprazole (PRILOSEC) 40 MG capsule Take 40 mg by mouth daily.  ? ropinirole (REQUIP) 5 MG tablet Take 5 mg by mouth at bedtime.  ? sertraline (ZOLOFT) 100 MG tablet Take 200 mg by mouth daily.  ? traZODone (DESYREL) 100 MG tablet Take 100 mg by mouth at bedtime.  ? TURMERIC CURCUMIN PO Take 1 capsule by mouth daily.  ?  ? ?Allergies:   Bee venom, Parafon forte dsc [chlorzoxazone], and Sulfa antibiotics  ? ?Social History  ? ?Socioeconomic History  ? Marital status: Widowed  ?  Spouse name: Not on file  ? Number of children: Not on file  ? Years of education: Not on file  ? Highest education level: Not on file  ?Occupational History  ? Not on file  ?Tobacco Use  ? Smoking status: Former  ?  Types: Cigarettes  ? Smokeless tobacco: Never  ? Tobacco comments:  ?  Quit 40 years ago  ?Vaping Use  ?  Vaping Use: Never used  ?Substance and Sexual Activity  ? Alcohol use: No  ? Drug use: No  ? Sexual activity: Not on file  ?Other Topics Concern  ? Not on file  ?Social History Narrative  ? Not on file  ? ?Social Determinants of Health  ? ?Financial Resource Strain: Not on file  ?Food Insecurity: Not on file  ?Transportation Needs: Not on file  ?Physical Activity: Not on file  ?Stress: Not on file  ?Social Connections: Not on file  ?  ? ?Family History: ?The patient's family history includes High Cholesterol in her mother; High blood pressure in her brother and mother. ? ?ROS:   ?Please see the history of present illness.    ?All other systems reviewed and are negative. ? ?EKGs/Labs/Other Studies Reviewed:   ? ?The following studies were reviewed today: ?EKG reveals sinus rhythm and nonspecific ST-T changes QRS is unremarkable. ? ? ?Recent Labs: ?01/29/2021: ALT 19 ?  02/12/2021: BUN 16; Creatinine, Ser 0.71; Hemoglobin 9.1; Platelets 241; Potassium 3.9; Sodium 141  ?Recent Lipid Panel ?No results found for: CHOL, TRIG, HDL, CHOLHDL, VLDL, LDLCALC, LDLDIRECT ? ?Physical Exam:   ? ?VS:  BP (!) 148/76   Pulse 63   Ht 5\' 3"  (1.6 m)   Wt 145 lb (65.8 kg)   SpO2 98%   BMI 25.69 kg/m?    ? ?Wt Readings from Last 3 Encounters:  ?05/07/21 145 lb (65.8 kg)  ?04/29/21 144 lb 12.8 oz (65.7 kg)  ?02/11/21 153 lb (69.4 kg)  ?  ? ?GEN: Patient is in no acute distress ?HEENT: Normal ?NECK: No JVD; No carotid bruits ?LYMPHATICS: No lymphadenopathy ?CARDIAC: Hear sounds regular, 2/6 systolic murmur at the apex. ?RESPIRATORY:  Clear to auscultation without rales, wheezing or rhonchi  ?ABDOMEN: Soft, non-tender, non-distended ?MUSCULOSKELETAL:  No edema; No deformity  ?SKIN: Warm and dry ?NEUROLOGIC:  Alert and oriented x 3 ?PSYCHIATRIC:  Normal affect  ? ?Signed, ?Jenean Lindau, MD  ?05/07/2021 11:06 AM    ?[facility]  ?

## 2021-05-07 NOTE — Patient Instructions (Signed)

## 2021-05-21 DIAGNOSIS — R1310 Dysphagia, unspecified: Secondary | ICD-10-CM | POA: Diagnosis not present

## 2021-05-21 DIAGNOSIS — K219 Gastro-esophageal reflux disease without esophagitis: Secondary | ICD-10-CM | POA: Diagnosis not present

## 2021-05-21 DIAGNOSIS — K649 Unspecified hemorrhoids: Secondary | ICD-10-CM | POA: Diagnosis not present

## 2021-05-21 DIAGNOSIS — K5900 Constipation, unspecified: Secondary | ICD-10-CM | POA: Diagnosis not present

## 2021-05-22 ENCOUNTER — Ambulatory Visit: Payer: Medicare HMO | Admitting: Cardiology

## 2021-06-04 DIAGNOSIS — E785 Hyperlipidemia, unspecified: Secondary | ICD-10-CM | POA: Diagnosis not present

## 2021-06-04 DIAGNOSIS — I4891 Unspecified atrial fibrillation: Secondary | ICD-10-CM | POA: Diagnosis not present

## 2021-06-04 DIAGNOSIS — Z131 Encounter for screening for diabetes mellitus: Secondary | ICD-10-CM | POA: Diagnosis not present

## 2021-06-04 DIAGNOSIS — K219 Gastro-esophageal reflux disease without esophagitis: Secondary | ICD-10-CM | POA: Diagnosis not present

## 2021-06-04 DIAGNOSIS — Z79899 Other long term (current) drug therapy: Secondary | ICD-10-CM | POA: Diagnosis not present

## 2021-06-04 DIAGNOSIS — Z13820 Encounter for screening for osteoporosis: Secondary | ICD-10-CM | POA: Diagnosis not present

## 2021-06-04 DIAGNOSIS — Z0000 Encounter for general adult medical examination without abnormal findings: Secondary | ICD-10-CM | POA: Diagnosis not present

## 2021-06-04 DIAGNOSIS — F3341 Major depressive disorder, recurrent, in partial remission: Secondary | ICD-10-CM | POA: Diagnosis not present

## 2021-06-04 DIAGNOSIS — E039 Hypothyroidism, unspecified: Secondary | ICD-10-CM | POA: Diagnosis not present

## 2021-06-09 ENCOUNTER — Ambulatory Visit: Payer: Medicare HMO | Admitting: Cardiology

## 2021-06-12 DIAGNOSIS — K644 Residual hemorrhoidal skin tags: Secondary | ICD-10-CM | POA: Diagnosis not present

## 2021-06-12 DIAGNOSIS — Z86010 Personal history of colonic polyps: Secondary | ICD-10-CM | POA: Diagnosis not present

## 2021-06-12 DIAGNOSIS — Z1211 Encounter for screening for malignant neoplasm of colon: Secondary | ICD-10-CM | POA: Diagnosis not present

## 2021-06-12 DIAGNOSIS — D126 Benign neoplasm of colon, unspecified: Secondary | ICD-10-CM | POA: Diagnosis not present

## 2021-06-12 DIAGNOSIS — K648 Other hemorrhoids: Secondary | ICD-10-CM | POA: Diagnosis not present

## 2021-06-12 DIAGNOSIS — K635 Polyp of colon: Secondary | ICD-10-CM | POA: Diagnosis not present

## 2021-06-25 DIAGNOSIS — Z6823 Body mass index (BMI) 23.0-23.9, adult: Secondary | ICD-10-CM | POA: Diagnosis not present

## 2021-06-25 DIAGNOSIS — R1903 Right lower quadrant abdominal swelling, mass and lump: Secondary | ICD-10-CM | POA: Diagnosis not present

## 2021-06-25 DIAGNOSIS — M25572 Pain in left ankle and joints of left foot: Secondary | ICD-10-CM | POA: Diagnosis not present

## 2021-06-25 DIAGNOSIS — R413 Other amnesia: Secondary | ICD-10-CM | POA: Diagnosis not present

## 2021-06-25 DIAGNOSIS — G8929 Other chronic pain: Secondary | ICD-10-CM | POA: Diagnosis not present

## 2021-06-30 DIAGNOSIS — R1903 Right lower quadrant abdominal swelling, mass and lump: Secondary | ICD-10-CM | POA: Diagnosis not present

## 2021-07-15 ENCOUNTER — Ambulatory Visit: Payer: Medicare HMO | Admitting: Cardiology

## 2021-07-15 DIAGNOSIS — M25572 Pain in left ankle and joints of left foot: Secondary | ICD-10-CM | POA: Diagnosis not present

## 2021-07-15 DIAGNOSIS — R29898 Other symptoms and signs involving the musculoskeletal system: Secondary | ICD-10-CM | POA: Insufficient documentation

## 2021-07-15 DIAGNOSIS — G8929 Other chronic pain: Secondary | ICD-10-CM

## 2021-07-15 HISTORY — DX: Other chronic pain: G89.29

## 2021-07-15 HISTORY — DX: Other symptoms and signs involving the musculoskeletal system: R29.898

## 2021-07-24 DIAGNOSIS — R55 Syncope and collapse: Secondary | ICD-10-CM | POA: Diagnosis not present

## 2021-07-24 DIAGNOSIS — G8929 Other chronic pain: Secondary | ICD-10-CM | POA: Diagnosis not present

## 2021-07-24 DIAGNOSIS — Z6823 Body mass index (BMI) 23.0-23.9, adult: Secondary | ICD-10-CM | POA: Diagnosis not present

## 2021-07-24 DIAGNOSIS — M25572 Pain in left ankle and joints of left foot: Secondary | ICD-10-CM | POA: Diagnosis not present

## 2021-07-24 DIAGNOSIS — F3341 Major depressive disorder, recurrent, in partial remission: Secondary | ICD-10-CM | POA: Diagnosis not present

## 2021-07-24 DIAGNOSIS — I4891 Unspecified atrial fibrillation: Secondary | ICD-10-CM | POA: Diagnosis not present

## 2021-08-07 DIAGNOSIS — M25672 Stiffness of left ankle, not elsewhere classified: Secondary | ICD-10-CM | POA: Diagnosis not present

## 2021-08-07 DIAGNOSIS — R2689 Other abnormalities of gait and mobility: Secondary | ICD-10-CM | POA: Diagnosis not present

## 2021-08-07 DIAGNOSIS — M25572 Pain in left ankle and joints of left foot: Secondary | ICD-10-CM | POA: Diagnosis not present

## 2021-08-13 DIAGNOSIS — M25572 Pain in left ankle and joints of left foot: Secondary | ICD-10-CM | POA: Diagnosis not present

## 2021-08-13 DIAGNOSIS — R2689 Other abnormalities of gait and mobility: Secondary | ICD-10-CM | POA: Diagnosis not present

## 2021-08-13 DIAGNOSIS — M25672 Stiffness of left ankle, not elsewhere classified: Secondary | ICD-10-CM | POA: Diagnosis not present

## 2021-08-15 DIAGNOSIS — M25572 Pain in left ankle and joints of left foot: Secondary | ICD-10-CM | POA: Diagnosis not present

## 2021-08-15 DIAGNOSIS — R2689 Other abnormalities of gait and mobility: Secondary | ICD-10-CM | POA: Diagnosis not present

## 2021-08-15 DIAGNOSIS — M25672 Stiffness of left ankle, not elsewhere classified: Secondary | ICD-10-CM | POA: Diagnosis not present

## 2021-08-19 DIAGNOSIS — M25572 Pain in left ankle and joints of left foot: Secondary | ICD-10-CM | POA: Diagnosis not present

## 2021-08-19 DIAGNOSIS — M25672 Stiffness of left ankle, not elsewhere classified: Secondary | ICD-10-CM | POA: Diagnosis not present

## 2021-08-19 DIAGNOSIS — R2689 Other abnormalities of gait and mobility: Secondary | ICD-10-CM | POA: Diagnosis not present

## 2021-08-22 DIAGNOSIS — M25572 Pain in left ankle and joints of left foot: Secondary | ICD-10-CM | POA: Diagnosis not present

## 2021-08-22 DIAGNOSIS — M25672 Stiffness of left ankle, not elsewhere classified: Secondary | ICD-10-CM | POA: Diagnosis not present

## 2021-08-22 DIAGNOSIS — R2689 Other abnormalities of gait and mobility: Secondary | ICD-10-CM | POA: Diagnosis not present

## 2021-08-25 DIAGNOSIS — M25672 Stiffness of left ankle, not elsewhere classified: Secondary | ICD-10-CM | POA: Diagnosis not present

## 2021-08-25 DIAGNOSIS — M25572 Pain in left ankle and joints of left foot: Secondary | ICD-10-CM | POA: Diagnosis not present

## 2021-08-25 DIAGNOSIS — R2689 Other abnormalities of gait and mobility: Secondary | ICD-10-CM | POA: Diagnosis not present

## 2021-08-26 DIAGNOSIS — M25572 Pain in left ankle and joints of left foot: Secondary | ICD-10-CM | POA: Diagnosis not present

## 2021-08-26 DIAGNOSIS — G8929 Other chronic pain: Secondary | ICD-10-CM | POA: Diagnosis not present

## 2021-08-26 DIAGNOSIS — R29898 Other symptoms and signs involving the musculoskeletal system: Secondary | ICD-10-CM | POA: Diagnosis not present

## 2021-08-29 DIAGNOSIS — M25572 Pain in left ankle and joints of left foot: Secondary | ICD-10-CM | POA: Diagnosis not present

## 2021-08-29 DIAGNOSIS — M25672 Stiffness of left ankle, not elsewhere classified: Secondary | ICD-10-CM | POA: Diagnosis not present

## 2021-08-29 DIAGNOSIS — R2689 Other abnormalities of gait and mobility: Secondary | ICD-10-CM | POA: Diagnosis not present

## 2021-09-03 DIAGNOSIS — M25672 Stiffness of left ankle, not elsewhere classified: Secondary | ICD-10-CM | POA: Diagnosis not present

## 2021-09-03 DIAGNOSIS — R2689 Other abnormalities of gait and mobility: Secondary | ICD-10-CM | POA: Diagnosis not present

## 2021-09-03 DIAGNOSIS — M25572 Pain in left ankle and joints of left foot: Secondary | ICD-10-CM | POA: Diagnosis not present

## 2021-09-05 DIAGNOSIS — M25572 Pain in left ankle and joints of left foot: Secondary | ICD-10-CM | POA: Diagnosis not present

## 2021-09-05 DIAGNOSIS — M25672 Stiffness of left ankle, not elsewhere classified: Secondary | ICD-10-CM | POA: Diagnosis not present

## 2021-09-05 DIAGNOSIS — R2689 Other abnormalities of gait and mobility: Secondary | ICD-10-CM | POA: Diagnosis not present

## 2021-09-08 DIAGNOSIS — R2689 Other abnormalities of gait and mobility: Secondary | ICD-10-CM | POA: Diagnosis not present

## 2021-09-08 DIAGNOSIS — M25672 Stiffness of left ankle, not elsewhere classified: Secondary | ICD-10-CM | POA: Diagnosis not present

## 2021-09-08 DIAGNOSIS — M25572 Pain in left ankle and joints of left foot: Secondary | ICD-10-CM | POA: Diagnosis not present

## 2021-09-11 DIAGNOSIS — M25672 Stiffness of left ankle, not elsewhere classified: Secondary | ICD-10-CM | POA: Diagnosis not present

## 2021-09-11 DIAGNOSIS — R2689 Other abnormalities of gait and mobility: Secondary | ICD-10-CM | POA: Diagnosis not present

## 2021-09-11 DIAGNOSIS — M25572 Pain in left ankle and joints of left foot: Secondary | ICD-10-CM | POA: Diagnosis not present

## 2021-09-12 DIAGNOSIS — S3992XA Unspecified injury of lower back, initial encounter: Secondary | ICD-10-CM | POA: Diagnosis not present

## 2021-09-12 DIAGNOSIS — R0781 Pleurodynia: Secondary | ICD-10-CM | POA: Diagnosis not present

## 2021-09-12 DIAGNOSIS — Z6822 Body mass index (BMI) 22.0-22.9, adult: Secondary | ICD-10-CM | POA: Diagnosis not present

## 2021-09-12 DIAGNOSIS — M542 Cervicalgia: Secondary | ICD-10-CM | POA: Diagnosis not present

## 2021-09-18 DIAGNOSIS — M25672 Stiffness of left ankle, not elsewhere classified: Secondary | ICD-10-CM | POA: Diagnosis not present

## 2021-09-18 DIAGNOSIS — M25572 Pain in left ankle and joints of left foot: Secondary | ICD-10-CM | POA: Diagnosis not present

## 2021-09-18 DIAGNOSIS — R2689 Other abnormalities of gait and mobility: Secondary | ICD-10-CM | POA: Diagnosis not present

## 2021-09-23 DIAGNOSIS — M25572 Pain in left ankle and joints of left foot: Secondary | ICD-10-CM | POA: Diagnosis not present

## 2021-09-23 DIAGNOSIS — R2689 Other abnormalities of gait and mobility: Secondary | ICD-10-CM | POA: Diagnosis not present

## 2021-09-23 DIAGNOSIS — M25672 Stiffness of left ankle, not elsewhere classified: Secondary | ICD-10-CM | POA: Diagnosis not present

## 2021-09-25 DIAGNOSIS — M25672 Stiffness of left ankle, not elsewhere classified: Secondary | ICD-10-CM | POA: Diagnosis not present

## 2021-09-25 DIAGNOSIS — M25572 Pain in left ankle and joints of left foot: Secondary | ICD-10-CM | POA: Diagnosis not present

## 2021-09-25 DIAGNOSIS — R2689 Other abnormalities of gait and mobility: Secondary | ICD-10-CM | POA: Diagnosis not present

## 2021-09-29 DIAGNOSIS — M25672 Stiffness of left ankle, not elsewhere classified: Secondary | ICD-10-CM | POA: Diagnosis not present

## 2021-09-29 DIAGNOSIS — R2689 Other abnormalities of gait and mobility: Secondary | ICD-10-CM | POA: Diagnosis not present

## 2021-09-29 DIAGNOSIS — M25572 Pain in left ankle and joints of left foot: Secondary | ICD-10-CM | POA: Diagnosis not present

## 2021-10-03 DIAGNOSIS — M25672 Stiffness of left ankle, not elsewhere classified: Secondary | ICD-10-CM | POA: Diagnosis not present

## 2021-10-03 DIAGNOSIS — M25572 Pain in left ankle and joints of left foot: Secondary | ICD-10-CM | POA: Diagnosis not present

## 2021-10-03 DIAGNOSIS — R2689 Other abnormalities of gait and mobility: Secondary | ICD-10-CM | POA: Diagnosis not present

## 2021-10-07 DIAGNOSIS — R2689 Other abnormalities of gait and mobility: Secondary | ICD-10-CM | POA: Diagnosis not present

## 2021-10-07 DIAGNOSIS — M25672 Stiffness of left ankle, not elsewhere classified: Secondary | ICD-10-CM | POA: Diagnosis not present

## 2021-10-07 DIAGNOSIS — M25572 Pain in left ankle and joints of left foot: Secondary | ICD-10-CM | POA: Diagnosis not present

## 2021-10-09 DIAGNOSIS — M25572 Pain in left ankle and joints of left foot: Secondary | ICD-10-CM | POA: Diagnosis not present

## 2021-10-09 DIAGNOSIS — M25672 Stiffness of left ankle, not elsewhere classified: Secondary | ICD-10-CM | POA: Diagnosis not present

## 2021-10-09 DIAGNOSIS — R2689 Other abnormalities of gait and mobility: Secondary | ICD-10-CM | POA: Diagnosis not present

## 2021-10-14 DIAGNOSIS — M25672 Stiffness of left ankle, not elsewhere classified: Secondary | ICD-10-CM | POA: Diagnosis not present

## 2021-10-14 DIAGNOSIS — M25572 Pain in left ankle and joints of left foot: Secondary | ICD-10-CM | POA: Diagnosis not present

## 2021-10-14 DIAGNOSIS — R2689 Other abnormalities of gait and mobility: Secondary | ICD-10-CM | POA: Diagnosis not present

## 2021-10-16 DIAGNOSIS — M25572 Pain in left ankle and joints of left foot: Secondary | ICD-10-CM | POA: Diagnosis not present

## 2021-10-16 DIAGNOSIS — R2689 Other abnormalities of gait and mobility: Secondary | ICD-10-CM | POA: Diagnosis not present

## 2021-10-16 DIAGNOSIS — M25672 Stiffness of left ankle, not elsewhere classified: Secondary | ICD-10-CM | POA: Diagnosis not present

## 2021-10-21 DIAGNOSIS — M25672 Stiffness of left ankle, not elsewhere classified: Secondary | ICD-10-CM | POA: Diagnosis not present

## 2021-10-21 DIAGNOSIS — R2689 Other abnormalities of gait and mobility: Secondary | ICD-10-CM | POA: Diagnosis not present

## 2021-10-21 DIAGNOSIS — M25572 Pain in left ankle and joints of left foot: Secondary | ICD-10-CM | POA: Diagnosis not present

## 2021-10-23 DIAGNOSIS — R2689 Other abnormalities of gait and mobility: Secondary | ICD-10-CM | POA: Diagnosis not present

## 2021-10-23 DIAGNOSIS — M25672 Stiffness of left ankle, not elsewhere classified: Secondary | ICD-10-CM | POA: Diagnosis not present

## 2021-10-23 DIAGNOSIS — M25572 Pain in left ankle and joints of left foot: Secondary | ICD-10-CM | POA: Diagnosis not present

## 2021-10-28 DIAGNOSIS — M25572 Pain in left ankle and joints of left foot: Secondary | ICD-10-CM | POA: Diagnosis not present

## 2021-10-28 DIAGNOSIS — R2689 Other abnormalities of gait and mobility: Secondary | ICD-10-CM | POA: Diagnosis not present

## 2021-10-28 DIAGNOSIS — M25672 Stiffness of left ankle, not elsewhere classified: Secondary | ICD-10-CM | POA: Diagnosis not present

## 2021-10-30 DIAGNOSIS — M25572 Pain in left ankle and joints of left foot: Secondary | ICD-10-CM | POA: Diagnosis not present

## 2021-10-30 DIAGNOSIS — R2689 Other abnormalities of gait and mobility: Secondary | ICD-10-CM | POA: Diagnosis not present

## 2021-10-30 DIAGNOSIS — M25672 Stiffness of left ankle, not elsewhere classified: Secondary | ICD-10-CM | POA: Diagnosis not present

## 2021-11-11 ENCOUNTER — Ambulatory Visit: Payer: Medicare HMO | Admitting: Cardiology

## 2021-11-13 ENCOUNTER — Encounter: Payer: Self-pay | Admitting: Cardiology

## 2021-11-13 ENCOUNTER — Ambulatory Visit: Payer: Medicare HMO | Attending: Cardiology | Admitting: Cardiology

## 2021-11-13 VITALS — BP 120/62 | HR 65 | Ht 64.0 in | Wt 140.6 lb

## 2021-11-13 DIAGNOSIS — I493 Ventricular premature depolarization: Secondary | ICD-10-CM | POA: Diagnosis not present

## 2021-11-13 DIAGNOSIS — E782 Mixed hyperlipidemia: Secondary | ICD-10-CM

## 2021-11-13 MED ORDER — FLECAINIDE ACETATE 100 MG PO TABS: 100.0000 mg | ORAL_TABLET | Freq: Two times a day (BID) | ORAL | 3 refills | Status: DC

## 2021-11-13 NOTE — Patient Instructions (Signed)

## 2021-11-13 NOTE — Progress Notes (Signed)
Cardiology Office Note:    Date:  11/13/2021   ID:  [name], DOB [birth year], MRN [id number]  PCP:  [name], MD  Cardiologist:  Jenean Lindau, MD   Referring MD: [name], MD    ASSESSMENT:    1. PVC (premature ventricular contraction)   2. PVC's (premature ventricular contractions)   3. Mixed hyperlipidemia    PLAN:    In order of problems listed above:  Primary prevention stressed with the patient.  Importance of compliance with diet and medication stressed and she vocalized understanding.  She was advised to walk at least half an hour a day 5 days a week and she promises to do so. Frequent PVCs: Managed well in suppressed with flecainide.  She is tolerating medication well.  She is going to her primary doctor to get complete blood work in the next few days.  I requested her to send me a copy.  She has had a stress test last year which was unremarkable.  Her effort tolerance and symptom profile has not changed. Mixed dyslipidemia: On lipid-lowering medication.  Managed by primary care.  Lipids reviewed.  Diet emphasized. Patient will be seen in follow-up appointment in 12 months or earlier if the patient has any concerns    Medication Adjustments/Labs and Tests Ordered: Current medicines are reviewed at length with the patient today.  Concerns regarding medicines are outlined above.  No orders of the defined types were placed in this encounter.  No orders of the defined types were placed in this encounter.    No chief complaint on file.    History of Present Illness:    Laura Singleton is a 76 y.o. female.  Patient has past medical history of frequent PVCs, mixed dyslipidemia.  She denies any problems at this time and takes care of activities of daily living.  Electrophysiology colleagues have been on flecainide and she is tolerating it well.  She walks on a regular basis.  At the time of my evaluation, the patient is alert awake oriented and in no  distress.  Past Medical History:  Diagnosis Date   Ankle weakness 07/15/2021   Arthritis    Body mass index (BMI) 26.0-26.9, adult 06/20/2020   Cardiac arrhythmia 03/28/2020   Cardiac murmur 05/29/2020   Carpal tunnel syndrome 06/07/2018   Cervical myelopathy ([facility]) 11/05/2016   Cervical radiculopathy 10/06/2016   Chronic pain of left ankle 07/15/2021   Chronic pain of left knee 11/17/2018   Depression    Dysrhythmia    Elevated blood pressure reading in office with white coat syndrome, without diagnosis of hypertension 05/07/2021   Encounter for monitoring flecainide therapy 05/29/2020   GERD (gastroesophageal reflux disease)    History of total knee arthroplasty 12/26/2019   Hyperlipidemia 11/05/2016   Hypothyroidism    Myelopathy of cervical spinal cord with cervical radiculopathy ([place]) 11/05/2016   Neuropathy 05/31/2018   Oropharyngeal dysphagia 12/01/2016   Osteoarthritis of right knee 01/29/2021   Preoperative cardiovascular examination 11/21/2019   PVC (premature ventricular contraction) 11/05/2016   PVC's (premature ventricular contractions)    S/P total knee arthroplasty, left 12/26/2019   S/P total knee arthroplasty, right 02/11/2021   Skin cancer of face    Spinal stenosis 07/17/2020   Spinal stenosis in cervical region 10/06/2016   Ulnar neuropathy of both upper extremities 06/20/2020   Ventricular premature beats 11/05/2016   Wolff-Parkinson-White (WPW) pattern 11/05/2016   Wolff-Parkinson-White (WPW) syndrome    WPW (Wolff-Parkinson-White syndrome) 11/05/2016    Past  Surgical History:  Procedure Laterality Date   ANTERIOR CERVICAL DECOMP/DISCECTOMY FUSION N/A 11/05/2016   Procedure: ANTERIOR CERVICAL DECOMPRESSION/DISCECTOMY FUSION  - CERVICAL THREE-FOUR, CERVICAL FIVE-SIX, CERVICAL SIX -SEVEN;  Surgeon: Kary Kos, MD;  Location: [place];  Service: Neurosurgery;  Laterality: N/A;   COLONOSCOPY     EYE SURGERY     both eyes lens implants   LUMBAR LAMINECTOMY/DECOMPRESSION  MICRODISCECTOMY Bilateral 07/17/2020   Procedure: Laminectomy and Foraminotomy - bilateral - Thoracic ten-Thoracic eleven - Lumbar one-Lumbar two;  Surgeon: Kary Kos, MD;  Location: [place];  Service: Neurosurgery;  Laterality: Bilateral;   SKIN CANCER EXCISION     Face   thumb surgery Right    TONSILLECTOMY     TOTAL KNEE ARTHROPLASTY Left 12/26/2019   Procedure: TOTAL KNEE ARTHROPLASTY;  Surgeon: Paralee Cancel, MD;  Location: [facility];  Service: Orthopedics;  Laterality: Left;  70 mins   TOTAL KNEE ARTHROPLASTY Right 02/11/2021   Procedure: TOTAL KNEE ARTHROPLASTY;  Surgeon: Paralee Cancel, MD;  Location: [facility];  Service: Orthopedics;  Laterality: Right;   TUBAL LIGATION      Current Medications: Current Meds  Medication Sig   alendronate (FOSAMAX) 70 MG tablet Take 70 mg by mouth once a week.   atorvastatin (LIPITOR) 20 MG tablet Take 20 mg by mouth at bedtime.   calcium carbonate (OS-CAL) 600 MG TABS tablet Take 1,200 mg by mouth at bedtime.   flecainide (TAMBOCOR) 100 MG tablet Take 100 mg by mouth 2 (two) times daily.   levothyroxine (SYNTHROID) 112 MCG tablet Take 112 mcg by mouth daily before breakfast.   Multiple Vitamin (MULTIVITAMIN WITH MINERALS) TABS tablet Take 1 tablet by mouth daily. Centrum Silver   omeprazole (PRILOSEC) 40 MG capsule Take 40 mg by mouth daily.   ropinirole (REQUIP) 5 MG tablet Take 5 mg by mouth at bedtime.   sertraline (ZOLOFT) 100 MG tablet Take 200 mg by mouth daily.   traZODone (DESYREL) 100 MG tablet Take 100 mg by mouth at bedtime.     Allergies:   Bee venom, Parafon forte dsc [chlorzoxazone], and Sulfa antibiotics   Social History   Socioeconomic History   Marital status: Widowed    Spouse name: Not on file   Number of children: Not on file   Years of education: Not on file   Highest education level: Not on file  Occupational History   Not on file  Tobacco Use   Smoking status: Former    Types: Cigarettes   Smokeless tobacco: Never    Tobacco comments:    Quit 40 years ago  Vaping Use   Vaping Use: Never used  Substance and Sexual Activity   Alcohol use: No   Drug use: No   Sexual activity: Not on file  Other Topics Concern   Not on file  Social History Narrative   Not on file   Social Determinants of Health   Financial Resource Strain: Not on file  Food Insecurity: Not on file  Transportation Needs: Not on file  Physical Activity: Not on file  Stress: Not on file  Social Connections: Not on file     Family History: The patient's family history includes High Cholesterol in her mother; High blood pressure in her brother and mother.  ROS:   Please see the history of present illness.    All other systems reviewed and are negative.  EKGs/Labs/Other Studies Reviewed:    The following studies were reviewed today: EKG reveals sinus rhythm and nonspecific ST-T  changes QT was within normal limits   Recent Labs: 01/29/2021: ALT 19 02/12/2021: BUN 16; Creatinine, Ser 0.71; Hemoglobin 9.1; Platelets 241; Potassium 3.9; Sodium 141  Recent Lipid Panel No results found for: "CHOL", "TRIG", "HDL", "CHOLHDL", "VLDL", "LDLCALC", "LDLDIRECT"  Physical Exam:    VS:  BP 120/62   Pulse 65   Ht '5\' 4"'$  (1.626 m)   Wt 140 lb 9.6 oz (63.8 kg)   SpO2 95%   BMI 24.13 kg/m     Wt Readings from Last 3 Encounters:  11/13/21 140 lb 9.6 oz (63.8 kg)  05/07/21 145 lb (65.8 kg)  04/29/21 144 lb 12.8 oz (65.7 kg)     GEN: Patient is in no acute distress HEENT: Normal NECK: No JVD; No carotid bruits LYMPHATICS: No lymphadenopathy CARDIAC: Hear sounds regular, 2/6 systolic murmur at the apex. RESPIRATORY:  Clear to auscultation without rales, wheezing or rhonchi  ABDOMEN: Soft, non-tender, non-distended MUSCULOSKELETAL:  No edema; No deformity  SKIN: Warm and dry NEUROLOGIC:  Alert and oriented x 3 PSYCHIATRIC:  Normal affect   Signed, Jenean Lindau, MD  11/13/2021 5:04 PM    [facility]

## 2021-11-27 DIAGNOSIS — M25572 Pain in left ankle and joints of left foot: Secondary | ICD-10-CM | POA: Diagnosis not present

## 2021-11-27 DIAGNOSIS — G8929 Other chronic pain: Secondary | ICD-10-CM | POA: Diagnosis not present

## 2021-12-04 DIAGNOSIS — F3341 Major depressive disorder, recurrent, in partial remission: Secondary | ICD-10-CM | POA: Diagnosis not present

## 2021-12-04 DIAGNOSIS — R413 Other amnesia: Secondary | ICD-10-CM | POA: Diagnosis not present

## 2021-12-04 DIAGNOSIS — E039 Hypothyroidism, unspecified: Secondary | ICD-10-CM | POA: Diagnosis not present

## 2021-12-04 DIAGNOSIS — Z6821 Body mass index (BMI) 21.0-21.9, adult: Secondary | ICD-10-CM | POA: Diagnosis not present

## 2021-12-04 DIAGNOSIS — L309 Dermatitis, unspecified: Secondary | ICD-10-CM | POA: Diagnosis not present

## 2021-12-04 DIAGNOSIS — I4891 Unspecified atrial fibrillation: Secondary | ICD-10-CM | POA: Diagnosis not present

## 2021-12-25 DIAGNOSIS — M549 Dorsalgia, unspecified: Secondary | ICD-10-CM | POA: Diagnosis not present

## 2021-12-25 DIAGNOSIS — Z23 Encounter for immunization: Secondary | ICD-10-CM | POA: Diagnosis not present

## 2021-12-25 DIAGNOSIS — Z6822 Body mass index (BMI) 22.0-22.9, adult: Secondary | ICD-10-CM | POA: Diagnosis not present

## 2021-12-25 DIAGNOSIS — R0781 Pleurodynia: Secondary | ICD-10-CM | POA: Diagnosis not present

## 2021-12-26 DIAGNOSIS — R4182 Altered mental status, unspecified: Secondary | ICD-10-CM | POA: Diagnosis not present

## 2021-12-26 DIAGNOSIS — Z6822 Body mass index (BMI) 22.0-22.9, adult: Secondary | ICD-10-CM | POA: Diagnosis not present

## 2021-12-26 DIAGNOSIS — R260 Ataxic gait: Secondary | ICD-10-CM | POA: Diagnosis not present

## 2021-12-31 DIAGNOSIS — L820 Inflamed seborrheic keratosis: Secondary | ICD-10-CM | POA: Diagnosis not present

## 2021-12-31 DIAGNOSIS — R296 Repeated falls: Secondary | ICD-10-CM | POA: Diagnosis not present

## 2021-12-31 DIAGNOSIS — D485 Neoplasm of uncertain behavior of skin: Secondary | ICD-10-CM | POA: Diagnosis not present

## 2021-12-31 DIAGNOSIS — L209 Atopic dermatitis, unspecified: Secondary | ICD-10-CM | POA: Diagnosis not present

## 2021-12-31 DIAGNOSIS — L578 Other skin changes due to chronic exposure to nonionizing radiation: Secondary | ICD-10-CM | POA: Diagnosis not present

## 2021-12-31 DIAGNOSIS — R410 Disorientation, unspecified: Secondary | ICD-10-CM | POA: Diagnosis not present

## 2021-12-31 DIAGNOSIS — R4182 Altered mental status, unspecified: Secondary | ICD-10-CM | POA: Diagnosis not present

## 2021-12-31 DIAGNOSIS — R260 Ataxic gait: Secondary | ICD-10-CM | POA: Diagnosis not present

## 2021-12-31 DIAGNOSIS — L814 Other melanin hyperpigmentation: Secondary | ICD-10-CM | POA: Diagnosis not present

## 2022-01-03 DIAGNOSIS — G8929 Other chronic pain: Secondary | ICD-10-CM | POA: Diagnosis not present

## 2022-01-03 DIAGNOSIS — M25572 Pain in left ankle and joints of left foot: Secondary | ICD-10-CM | POA: Diagnosis not present

## 2022-01-03 DIAGNOSIS — S93492A Sprain of other ligament of left ankle, initial encounter: Secondary | ICD-10-CM | POA: Diagnosis not present

## 2022-01-12 DIAGNOSIS — R296 Repeated falls: Secondary | ICD-10-CM | POA: Diagnosis not present

## 2022-01-12 DIAGNOSIS — G2581 Restless legs syndrome: Secondary | ICD-10-CM | POA: Diagnosis not present

## 2022-01-12 DIAGNOSIS — R2681 Unsteadiness on feet: Secondary | ICD-10-CM | POA: Diagnosis not present

## 2022-01-12 DIAGNOSIS — Z6821 Body mass index (BMI) 21.0-21.9, adult: Secondary | ICD-10-CM | POA: Diagnosis not present

## 2022-01-12 DIAGNOSIS — G119 Hereditary ataxia, unspecified: Secondary | ICD-10-CM | POA: Diagnosis not present

## 2022-01-12 DIAGNOSIS — N1831 Chronic kidney disease, stage 3a: Secondary | ICD-10-CM | POA: Diagnosis not present

## 2022-02-04 DIAGNOSIS — R296 Repeated falls: Secondary | ICD-10-CM | POA: Diagnosis not present

## 2022-02-04 DIAGNOSIS — G119 Hereditary ataxia, unspecified: Secondary | ICD-10-CM | POA: Diagnosis not present

## 2022-02-11 DIAGNOSIS — Z6821 Body mass index (BMI) 21.0-21.9, adult: Secondary | ICD-10-CM | POA: Diagnosis not present

## 2022-02-11 DIAGNOSIS — R252 Cramp and spasm: Secondary | ICD-10-CM | POA: Diagnosis not present

## 2022-02-11 DIAGNOSIS — B001 Herpesviral vesicular dermatitis: Secondary | ICD-10-CM | POA: Diagnosis not present

## 2022-02-11 DIAGNOSIS — R2681 Unsteadiness on feet: Secondary | ICD-10-CM | POA: Diagnosis not present

## 2022-02-25 DIAGNOSIS — H579 Unspecified disorder of eye and adnexa: Secondary | ICD-10-CM | POA: Diagnosis not present

## 2022-02-25 DIAGNOSIS — H04123 Dry eye syndrome of bilateral lacrimal glands: Secondary | ICD-10-CM | POA: Diagnosis not present

## 2022-02-25 DIAGNOSIS — H01022 Squamous blepharitis right lower eyelid: Secondary | ICD-10-CM | POA: Diagnosis not present

## 2022-02-25 DIAGNOSIS — Z961 Presence of intraocular lens: Secondary | ICD-10-CM | POA: Diagnosis not present

## 2022-03-17 DIAGNOSIS — M7672 Peroneal tendinitis, left leg: Secondary | ICD-10-CM | POA: Diagnosis not present

## 2022-03-17 DIAGNOSIS — M25572 Pain in left ankle and joints of left foot: Secondary | ICD-10-CM | POA: Diagnosis not present

## 2022-03-17 DIAGNOSIS — R29898 Other symptoms and signs involving the musculoskeletal system: Secondary | ICD-10-CM | POA: Diagnosis not present

## 2022-03-17 DIAGNOSIS — G8929 Other chronic pain: Secondary | ICD-10-CM | POA: Diagnosis not present

## 2022-04-07 DIAGNOSIS — M4804 Spinal stenosis, thoracic region: Secondary | ICD-10-CM | POA: Insufficient documentation

## 2022-04-07 DIAGNOSIS — R269 Unspecified abnormalities of gait and mobility: Secondary | ICD-10-CM | POA: Diagnosis not present

## 2022-04-07 DIAGNOSIS — Z981 Arthrodesis status: Secondary | ICD-10-CM

## 2022-04-07 DIAGNOSIS — G2581 Restless legs syndrome: Secondary | ICD-10-CM | POA: Diagnosis not present

## 2022-04-07 DIAGNOSIS — G959 Disease of spinal cord, unspecified: Secondary | ICD-10-CM | POA: Diagnosis not present

## 2022-04-07 DIAGNOSIS — G992 Myelopathy in diseases classified elsewhere: Secondary | ICD-10-CM

## 2022-04-07 HISTORY — DX: Myelopathy in diseases classified elsewhere: G99.2

## 2022-04-07 HISTORY — DX: Spinal stenosis, thoracic region: M48.04

## 2022-04-07 HISTORY — DX: Arthrodesis status: Z98.1

## 2022-04-16 DIAGNOSIS — F5104 Psychophysiologic insomnia: Secondary | ICD-10-CM | POA: Diagnosis not present

## 2022-04-16 DIAGNOSIS — E538 Deficiency of other specified B group vitamins: Secondary | ICD-10-CM | POA: Diagnosis not present

## 2022-04-16 DIAGNOSIS — R110 Nausea: Secondary | ICD-10-CM | POA: Diagnosis not present

## 2022-04-16 DIAGNOSIS — E039 Hypothyroidism, unspecified: Secondary | ICD-10-CM | POA: Diagnosis not present

## 2022-04-16 DIAGNOSIS — R6889 Other general symptoms and signs: Secondary | ICD-10-CM | POA: Diagnosis not present

## 2022-04-16 DIAGNOSIS — Z6822 Body mass index (BMI) 22.0-22.9, adult: Secondary | ICD-10-CM | POA: Diagnosis not present

## 2022-04-18 DIAGNOSIS — M4802 Spinal stenosis, cervical region: Secondary | ICD-10-CM | POA: Diagnosis not present

## 2022-04-18 DIAGNOSIS — M5124 Other intervertebral disc displacement, thoracic region: Secondary | ICD-10-CM | POA: Diagnosis not present

## 2022-04-18 DIAGNOSIS — M4804 Spinal stenosis, thoracic region: Secondary | ICD-10-CM | POA: Diagnosis not present

## 2022-04-18 DIAGNOSIS — M5416 Radiculopathy, lumbar region: Secondary | ICD-10-CM | POA: Diagnosis not present

## 2022-04-18 DIAGNOSIS — M47814 Spondylosis without myelopathy or radiculopathy, thoracic region: Secondary | ICD-10-CM | POA: Diagnosis not present

## 2022-04-18 DIAGNOSIS — G9589 Other specified diseases of spinal cord: Secondary | ICD-10-CM | POA: Diagnosis not present

## 2022-04-18 DIAGNOSIS — M5104 Intervertebral disc disorders with myelopathy, thoracic region: Secondary | ICD-10-CM | POA: Diagnosis not present

## 2022-04-18 DIAGNOSIS — M5020 Other cervical disc displacement, unspecified cervical region: Secondary | ICD-10-CM | POA: Diagnosis not present

## 2022-04-18 DIAGNOSIS — M47812 Spondylosis without myelopathy or radiculopathy, cervical region: Secondary | ICD-10-CM | POA: Diagnosis not present

## 2022-04-18 DIAGNOSIS — M48061 Spinal stenosis, lumbar region without neurogenic claudication: Secondary | ICD-10-CM | POA: Diagnosis not present

## 2022-04-18 DIAGNOSIS — M5001 Cervical disc disorder with myelopathy,  high cervical region: Secondary | ICD-10-CM | POA: Diagnosis not present

## 2022-04-18 DIAGNOSIS — M5021 Other cervical disc displacement,  high cervical region: Secondary | ICD-10-CM | POA: Diagnosis not present

## 2022-04-18 DIAGNOSIS — M4312 Spondylolisthesis, cervical region: Secondary | ICD-10-CM | POA: Diagnosis not present

## 2022-05-11 ENCOUNTER — Encounter: Payer: Self-pay | Admitting: Cardiology

## 2022-05-11 ENCOUNTER — Ambulatory Visit: Payer: Medicare HMO | Attending: Cardiology | Admitting: Cardiology

## 2022-05-11 VITALS — BP 110/70 | HR 59 | Ht 64.0 in | Wt 147.0 lb

## 2022-05-11 DIAGNOSIS — I493 Ventricular premature depolarization: Secondary | ICD-10-CM | POA: Diagnosis not present

## 2022-05-11 DIAGNOSIS — I456 Pre-excitation syndrome: Secondary | ICD-10-CM

## 2022-05-11 DIAGNOSIS — Z79899 Other long term (current) drug therapy: Secondary | ICD-10-CM | POA: Diagnosis not present

## 2022-05-11 NOTE — Patient Instructions (Signed)
Medication Instructions:  °Your physician recommends that you continue on your current medications as directed. Please refer to the Current Medication list given to you today. ° °*If you need a refill on your cardiac medications before your next appointment, please call your pharmacy* ° ° °Lab Work: °None ordered ° ° °Testing/Procedures: °None ordered ° ° °Follow-Up: °At [facility], you and your health needs are our priority.  As part of our continuing mission to provide you with exceptional heart care, we have created designated Provider Care Teams.  These Care Teams include your primary Cardiologist (physician) and Advanced Practice Providers (APPs -  Physician Assistants and Nurse Practitioners) who all work together to provide you with the care you need, when you need it. ° °Your next appointment:   °1 year(s) ° °The format for your next appointment:   °In Person ° °Provider:   °Will Camnitz, MD ° ° ° °Thank you for choosing [facility]!! ° ° °Yochanan Eddleman, RN °[phone] ° ° ° °

## 2022-05-11 NOTE — Progress Notes (Signed)
Electrophysiology Office Note   Date:  05/11/2022   ID:  Laura Singleton, DOB 1945/10/23, MRN [id number]  PCP:  [name], MD  Cardiologist:  Revankar Primary Electrophysiologist:  Deveion Denz Meredith Leeds, MD    Chief Complaint: PVC   History of Present Illness: Laura Singleton is a 77 y.o. female who is being seen today for the evaluation of PVC at the request of [name], MD. Presenting today for electrophysiology evaluation.  She has a history significant for PVCs and Laura Singleton Parkinson White syndrome.  She was initiated on flecainide and tolerated this well.  WPW was diagnosed by Dr. Caryl Comes and started on flecainide.  She was offered ablation at [facility] but wished to avoid invasive procedures.  Today, denies symptoms of palpitations, chest pain, shortness of breath, orthopnea, PND, lower extremity edema, claudication, dizziness, presyncope, syncope, bleeding, or neurologic sequela. The patient is tolerating medications without difficulties.  She has had a few episodes of near syncope over the last few months.  She has not had any since stopping her trazodone a few months ago.  She states that they occur at different times, at times when she is sitting versus standing.  She does not note palpitations around the time of her episodes.    Past Medical History:  Diagnosis Date   Ankle weakness 07/15/2021   Arthritis    Body mass index (BMI) 26.0-26.9, adult 06/20/2020   Cardiac arrhythmia 03/28/2020   Cardiac murmur 05/29/2020   Carpal tunnel syndrome 06/07/2018   Cervical myelopathy (HCC) 11/05/2016   Cervical radiculopathy 10/06/2016   Chronic pain of left ankle 07/15/2021   Chronic pain of left knee 11/17/2018   Depression    Dysrhythmia    Elevated blood pressure reading in office with white coat syndrome, without diagnosis of hypertension 05/07/2021   Encounter for monitoring flecainide therapy 05/29/2020   GERD (gastroesophageal reflux disease)    History of total knee arthroplasty 12/26/2019    Hyperlipidemia 11/05/2016   Hypothyroidism    Myelopathy of cervical spinal cord with cervical radiculopathy ([place]) 11/05/2016   Neuropathy 05/31/2018   Oropharyngeal dysphagia 12/01/2016   Osteoarthritis of right knee 01/29/2021   Preoperative cardiovascular examination 11/21/2019   PVC (premature ventricular contraction) 11/05/2016   PVC's (premature ventricular contractions)    S/P total knee arthroplasty, left 12/26/2019   S/P total knee arthroplasty, right 02/11/2021   Skin cancer of face    Spinal stenosis 07/17/2020   Spinal stenosis in cervical region 10/06/2016   Ulnar neuropathy of both upper extremities 06/20/2020   Ventricular premature beats 11/05/2016   Wolff-Parkinson-White (WPW) pattern 11/05/2016   Wolff-Parkinson-White (WPW) syndrome    WPW (Wolff-Parkinson-White syndrome) 11/05/2016   Past Surgical History:  Procedure Laterality Date   ANTERIOR CERVICAL DECOMP/DISCECTOMY FUSION N/A 11/05/2016   Procedure: ANTERIOR CERVICAL DECOMPRESSION/DISCECTOMY FUSION  - CERVICAL THREE-FOUR, CERVICAL FIVE-SIX, CERVICAL SIX -SEVEN;  Surgeon: Kary Kos, MD;  Location: [place];  Service: Neurosurgery;  Laterality: N/A;   COLONOSCOPY     EYE SURGERY     both eyes lens implants   LUMBAR LAMINECTOMY/DECOMPRESSION MICRODISCECTOMY Bilateral 07/17/2020   Procedure: Laminectomy and Foraminotomy - bilateral - Thoracic ten-Thoracic eleven - Lumbar one-Lumbar two;  Surgeon: Kary Kos, MD;  Location: [place];  Service: Neurosurgery;  Laterality: Bilateral;   SKIN CANCER EXCISION     Face   thumb surgery Right    TONSILLECTOMY     TOTAL KNEE ARTHROPLASTY Left 12/26/2019   Procedure: TOTAL KNEE ARTHROPLASTY;  Surgeon: Paralee Cancel, MD;  Location: [facility];  Service: Orthopedics;  Laterality: Left;  70 mins   TOTAL KNEE ARTHROPLASTY Right 02/11/2021   Procedure: TOTAL KNEE ARTHROPLASTY;  Surgeon: Paralee Cancel, MD;  Location: [facility];  Service: Orthopedics;  Laterality: Right;   TUBAL LIGATION        Current Outpatient Medications  Medication Sig Dispense Refill   alendronate (FOSAMAX) 70 MG tablet Take 70 mg by mouth once a week.     atorvastatin (LIPITOR) 20 MG tablet Take 20 mg by mouth at bedtime.     calcium carbonate (OS-CAL) 600 MG TABS tablet Take 1,200 mg by mouth at bedtime.     flecainide (TAMBOCOR) 100 MG tablet Take 1 tablet (100 mg total) by mouth 2 (two) times daily. 180 tablet 3   levothyroxine (SYNTHROID) 112 MCG tablet Take 112 mcg by mouth daily before breakfast.     Multiple Vitamin (MULTIVITAMIN WITH MINERALS) TABS tablet Take 1 tablet by mouth daily. Centrum Silver     omeprazole (PRILOSEC) 40 MG capsule Take 40 mg by mouth daily.     ropinirole (REQUIP) 5 MG tablet Take 5 mg by mouth at bedtime.     sertraline (ZOLOFT) 100 MG tablet Take 200 mg by mouth daily.     traZODone (DESYREL) 100 MG tablet Take 100 mg by mouth at bedtime.     No current facility-administered medications for this visit.    Allergies:   Bee venom, Parafon forte dsc [chlorzoxazone], and Sulfa antibiotics   Social History:  The patient  reports that she has quit smoking. Her smoking use included cigarettes. She has never used smokeless tobacco. She reports that she does not drink alcohol and does not use drugs.   Family History:  The patient's family history includes High Cholesterol in her mother; High blood pressure in her brother and mother.   ROS:  Please see the history of present illness.   Otherwise, review of systems is positive for none.   All other systems are reviewed and negative.   PHYSICAL EXAM: VS:  There were no vitals taken for this visit. , BMI There is no height or weight on file to calculate BMI. GEN: Well nourished, well developed, in no acute distress  HEENT: normal  Neck: no JVD, carotid bruits, or masses Cardiac: RRR; no murmurs, rubs, or gallops,no edema  Respiratory:  clear to auscultation bilaterally, normal work of breathing GI: soft, nontender,  nondistended, + BS MS: no deformity or atrophy  Skin: warm and dry Neuro:  Strength and sensation are intact Psych: euthymic mood, full affect  EKG:  EKG is ordered today. Personal review of the ekg ordered shows sinus rhythm   Recent Labs: No results found for requested labs within last 365 days.    Lipid Panel  No results found for: "CHOL", "TRIG", "HDL", "CHOLHDL", "VLDL", "LDLCALC", "LDLDIRECT"   Wt Readings from Last 3 Encounters:  11/13/21 140 lb 9.6 oz (63.8 kg)  05/07/21 145 lb (65.8 kg)  04/29/21 144 lb 12.8 oz (65.7 kg)      Other studies Reviewed: Additional studies/ records that were reviewed today include: TTE 06/19/20  Review of the above records today demonstrates:   1. Left ventricular ejection fraction, by estimation, is 60 to 65%. The  left ventricle has normal function. The left ventricle has no regional  wall motion abnormalities. There is mild left ventricular hypertrophy.  Left ventricular diastolic parameters  are consistent with Grade I diastolic dysfunction (impaired relaxation).   2. Right ventricular systolic  function is normal. The right ventricular  size is normal. There is normal pulmonary artery systolic pressure.   3. The mitral valve is normal in structure. No evidence of mitral valve  regurgitation. No evidence of mitral stenosis.   4. The aortic valve is normal in structure. Aortic valve regurgitation is  not visualized. No aortic stenosis is present.   5. The inferior vena cava is normal in size with greater than 50%  respiratory variability, suggesting right atrial pressure of 3 mmHg.   Myoview 12/05/19 The left ventricular ejection fraction is normal (55-65%). Nuclear stress EF: 64%. There was no ST segment deviation noted during stress. No T wave inversion was noted during stress. The study is normal. This is a low risk study.    ASSESSMENT AND PLAN:  1.  PVCs: Currently on flecainide 100 mg twice daily.  No PVCs on EKG today.   Feeling well.  No changes.  2.  Hyperlipidemia: Continue statin per primary physician  3.  Wolff-Parkinson-White: Seen by EP in 2012 and was started on flecainide.  No ventricular preexcitation.  4.  High risk medication monitoring: Currently on flecainide.  EKG with normal intervals.  5.  Near syncope: Unclear cause.  She is stop trazodone and has not had any further episodes.  I have told her that if episodes continue, she Camdon Saetern need ILR implant.  Current medicines are reviewed at length with the patient today.   The patient does not have concerns regarding her medicines.  The following changes were made today: None  Labs/ tests ordered today include:  No orders of the defined types were placed in this encounter.    Disposition:   FU 12 months  Signed, Tami Blass Meredith Leeds, MD  05/11/2022 12:18 PM     [address] [place] [address] [phone] (office) [phone] (fax)

## 2022-05-12 DIAGNOSIS — M4316 Spondylolisthesis, lumbar region: Secondary | ICD-10-CM | POA: Diagnosis not present

## 2022-05-12 DIAGNOSIS — M4155 Other secondary scoliosis, thoracolumbar region: Secondary | ICD-10-CM | POA: Diagnosis not present

## 2022-05-12 DIAGNOSIS — M4150 Other secondary scoliosis, site unspecified: Secondary | ICD-10-CM | POA: Diagnosis not present

## 2022-05-12 DIAGNOSIS — M542 Cervicalgia: Secondary | ICD-10-CM | POA: Diagnosis not present

## 2022-05-12 DIAGNOSIS — Z981 Arthrodesis status: Secondary | ICD-10-CM | POA: Diagnosis not present

## 2022-05-12 DIAGNOSIS — M5031 Other cervical disc degeneration,  high cervical region: Secondary | ICD-10-CM | POA: Diagnosis not present

## 2022-05-17 IMAGING — RF DG THORACOLUMBAR SPINE 2V
1 series · 1 of 1 positions shown · non-contrast
Comparison: Lumbar spine MRI 06/19/2020.

CLINICAL DATA: Surgery, elective. Localization images for T10-11
and L1-L2 laminectomy. Provided fluoroscopy time 9 seconds (5.85
mGy).

EXAM:
THORACOLUMBAR SPINE 1V

[Series 1: run · 1 of 1 slices shown]
[im 1/1]
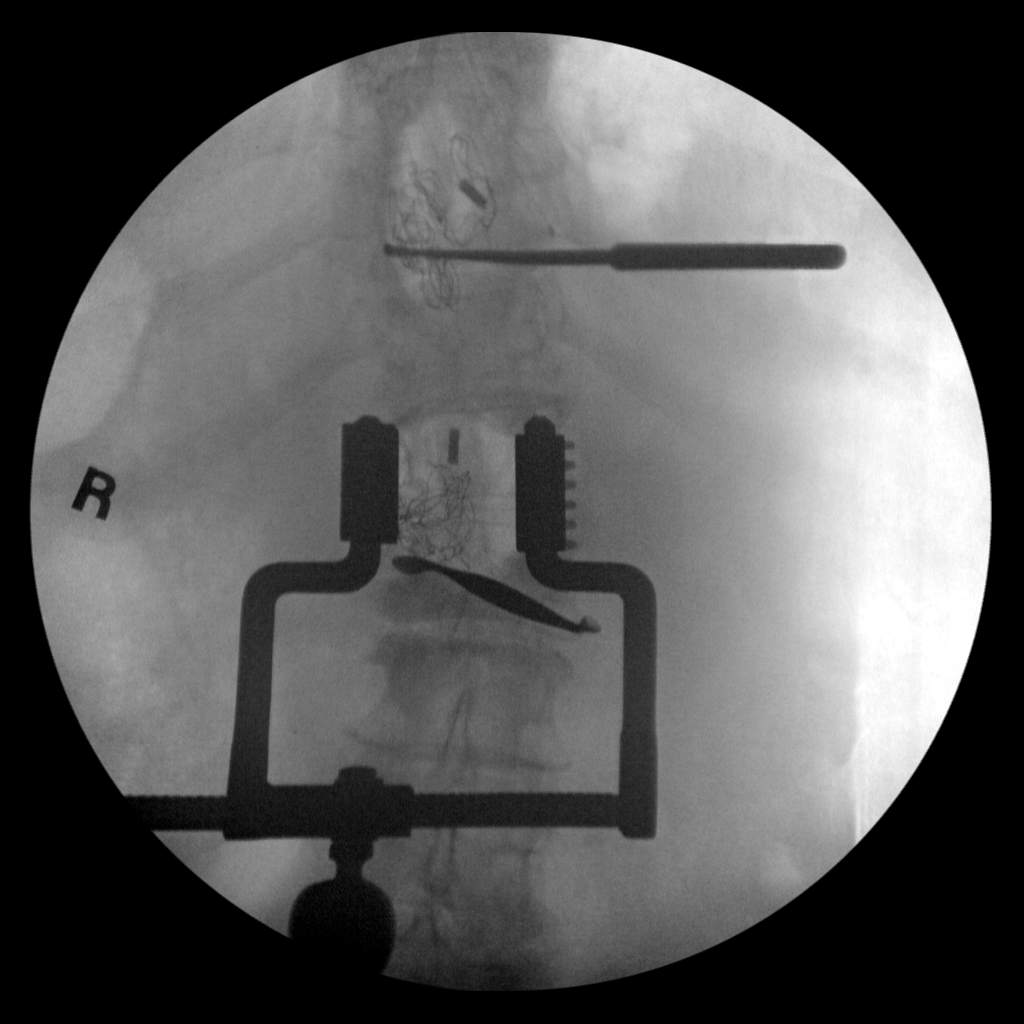

[1 of 1 positions shown; findings below may reference images not displayed]

FINDINGS: A single AP view intraoperative fluoroscopic image of the
thoracolumbar spine is submitted. On the provided image, surgical
instruments project over laminectomy defects at the T10-T11 and
L1-L2 levels. Curvilinear hyperdensities also projects over the
thoracolumbar spine at these levels, likely reflecting surgical
sponges/packing material. Overlying retractors at the L1-L2 level.
IMPRESSION: Single AP view intraoperative fluoroscopic image of the
thoracolumbar spine from reported T10-T11 and L1-L2 laminectomy, as
described.

## 2022-05-17 IMAGING — RF DG C-ARM 1-60 MIN
1 series · 1 of 1 positions shown · non-contrast
Comparison: Lumbar spine MRI 06/19/2020.

CLINICAL DATA: Surgery, elective. Localization images for T10-11
and L1-L2 laminectomy. Provided fluoroscopy time 9 seconds (5.85
mGy).

EXAM:
THORACOLUMBAR SPINE 1V

[Series 1: run · 1 of 1 slices shown]
[im 1/1]
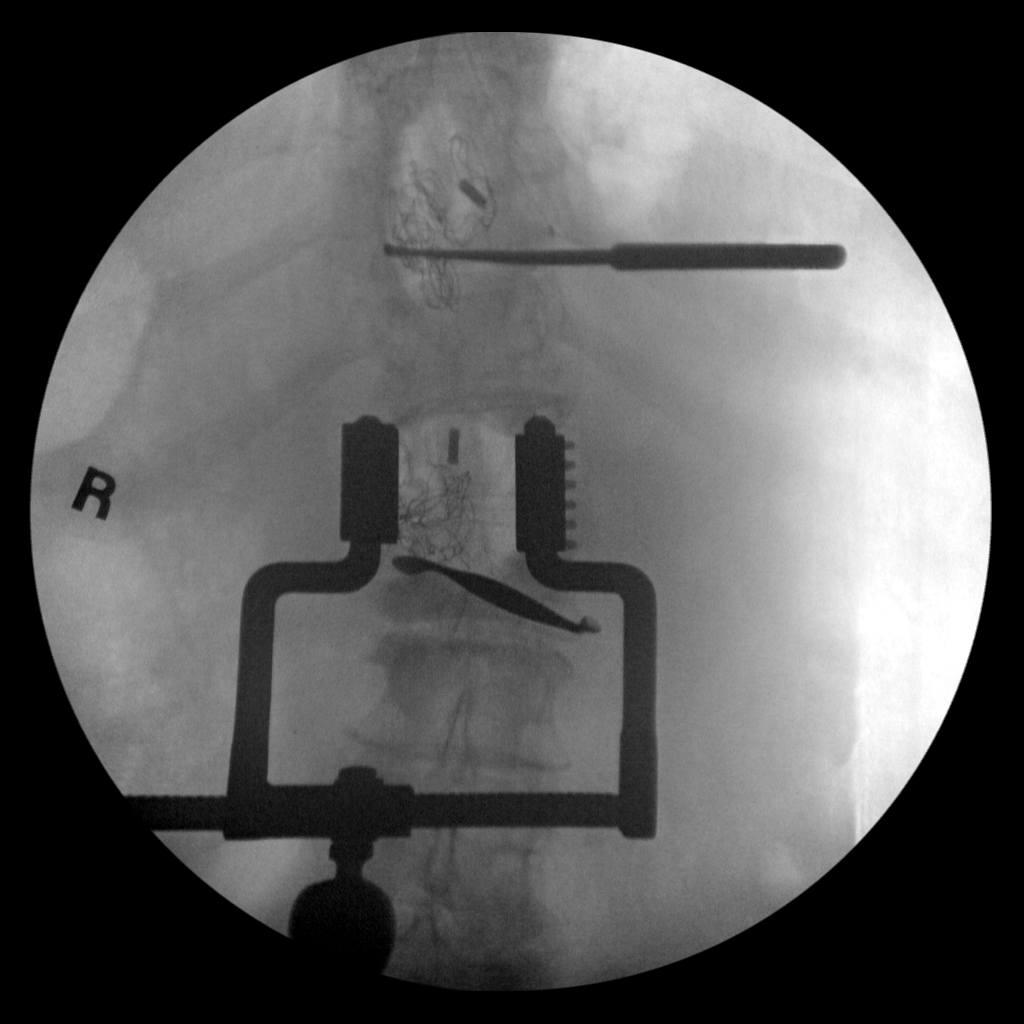

[1 of 1 positions shown; findings below may reference images not displayed]

FINDINGS: A single AP view intraoperative fluoroscopic image of the
thoracolumbar spine is submitted. On the provided image, surgical
instruments project over laminectomy defects at the T10-T11 and
L1-L2 levels. Curvilinear hyperdensities also projects over the
thoracolumbar spine at these levels, likely reflecting surgical
sponges/packing material. Overlying retractors at the L1-L2 level.
IMPRESSION: Single AP view intraoperative fluoroscopic image of the
thoracolumbar spine from reported T10-T11 and L1-L2 laminectomy, as
described.

## 2022-05-19 DIAGNOSIS — M4150 Other secondary scoliosis, site unspecified: Secondary | ICD-10-CM | POA: Insufficient documentation

## 2022-05-19 DIAGNOSIS — M542 Cervicalgia: Secondary | ICD-10-CM | POA: Insufficient documentation

## 2022-05-19 HISTORY — DX: Other secondary scoliosis, site unspecified: M41.50

## 2022-05-19 HISTORY — DX: Cervicalgia: M54.2

## 2022-05-29 DIAGNOSIS — R051 Acute cough: Secondary | ICD-10-CM | POA: Diagnosis not present

## 2022-05-29 DIAGNOSIS — H65192 Other acute nonsuppurative otitis media, left ear: Secondary | ICD-10-CM | POA: Diagnosis not present

## 2022-05-29 DIAGNOSIS — Z6823 Body mass index (BMI) 23.0-23.9, adult: Secondary | ICD-10-CM | POA: Diagnosis not present

## 2022-05-29 DIAGNOSIS — J029 Acute pharyngitis, unspecified: Secondary | ICD-10-CM | POA: Diagnosis not present

## 2022-05-29 DIAGNOSIS — R070 Pain in throat: Secondary | ICD-10-CM | POA: Diagnosis not present

## 2022-06-08 DIAGNOSIS — E785 Hyperlipidemia, unspecified: Secondary | ICD-10-CM | POA: Diagnosis not present

## 2022-06-08 DIAGNOSIS — G119 Hereditary ataxia, unspecified: Secondary | ICD-10-CM | POA: Diagnosis not present

## 2022-06-08 DIAGNOSIS — N1831 Chronic kidney disease, stage 3a: Secondary | ICD-10-CM | POA: Diagnosis not present

## 2022-06-08 DIAGNOSIS — E039 Hypothyroidism, unspecified: Secondary | ICD-10-CM | POA: Diagnosis not present

## 2022-06-08 DIAGNOSIS — Z0000 Encounter for general adult medical examination without abnormal findings: Secondary | ICD-10-CM | POA: Diagnosis not present

## 2022-06-08 DIAGNOSIS — I4891 Unspecified atrial fibrillation: Secondary | ICD-10-CM | POA: Diagnosis not present

## 2022-06-08 DIAGNOSIS — F3341 Major depressive disorder, recurrent, in partial remission: Secondary | ICD-10-CM | POA: Diagnosis not present

## 2022-06-08 DIAGNOSIS — Z79899 Other long term (current) drug therapy: Secondary | ICD-10-CM | POA: Diagnosis not present

## 2022-06-08 DIAGNOSIS — R7302 Impaired glucose tolerance (oral): Secondary | ICD-10-CM | POA: Diagnosis not present

## 2022-06-10 DIAGNOSIS — M545 Low back pain, unspecified: Secondary | ICD-10-CM | POA: Diagnosis not present

## 2022-06-10 DIAGNOSIS — M542 Cervicalgia: Secondary | ICD-10-CM | POA: Diagnosis not present

## 2022-06-11 DIAGNOSIS — Z1231 Encounter for screening mammogram for malignant neoplasm of breast: Secondary | ICD-10-CM | POA: Diagnosis not present

## 2022-06-16 DIAGNOSIS — M545 Low back pain, unspecified: Secondary | ICD-10-CM | POA: Diagnosis not present

## 2022-06-16 DIAGNOSIS — M542 Cervicalgia: Secondary | ICD-10-CM | POA: Diagnosis not present

## 2022-06-18 DIAGNOSIS — M545 Low back pain, unspecified: Secondary | ICD-10-CM | POA: Diagnosis not present

## 2022-06-18 DIAGNOSIS — M542 Cervicalgia: Secondary | ICD-10-CM | POA: Diagnosis not present

## 2022-06-22 DIAGNOSIS — M545 Low back pain, unspecified: Secondary | ICD-10-CM | POA: Diagnosis not present

## 2022-06-22 DIAGNOSIS — M542 Cervicalgia: Secondary | ICD-10-CM | POA: Diagnosis not present

## 2022-06-23 DIAGNOSIS — M47816 Spondylosis without myelopathy or radiculopathy, lumbar region: Secondary | ICD-10-CM | POA: Diagnosis not present

## 2022-06-23 DIAGNOSIS — M48062 Spinal stenosis, lumbar region with neurogenic claudication: Secondary | ICD-10-CM | POA: Diagnosis not present

## 2022-06-23 DIAGNOSIS — M4306 Spondylolysis, lumbar region: Secondary | ICD-10-CM | POA: Insufficient documentation

## 2022-06-23 DIAGNOSIS — M4804 Spinal stenosis, thoracic region: Secondary | ICD-10-CM | POA: Diagnosis not present

## 2022-06-23 DIAGNOSIS — M4712 Other spondylosis with myelopathy, cervical region: Secondary | ICD-10-CM

## 2022-06-23 DIAGNOSIS — Z981 Arthrodesis status: Secondary | ICD-10-CM | POA: Diagnosis not present

## 2022-06-23 DIAGNOSIS — G992 Myelopathy in diseases classified elsewhere: Secondary | ICD-10-CM | POA: Diagnosis not present

## 2022-06-23 HISTORY — DX: Other spondylosis with myelopathy, cervical region: M47.12

## 2022-06-23 HISTORY — DX: Spondylolysis, lumbar region: M43.06

## 2022-06-25 DIAGNOSIS — M545 Low back pain, unspecified: Secondary | ICD-10-CM | POA: Diagnosis not present

## 2022-06-25 DIAGNOSIS — M542 Cervicalgia: Secondary | ICD-10-CM | POA: Diagnosis not present

## 2022-06-30 DIAGNOSIS — M545 Low back pain, unspecified: Secondary | ICD-10-CM | POA: Diagnosis not present

## 2022-06-30 DIAGNOSIS — M542 Cervicalgia: Secondary | ICD-10-CM | POA: Diagnosis not present

## 2022-07-02 DIAGNOSIS — M542 Cervicalgia: Secondary | ICD-10-CM | POA: Diagnosis not present

## 2022-07-02 DIAGNOSIS — M545 Low back pain, unspecified: Secondary | ICD-10-CM | POA: Diagnosis not present

## 2022-07-09 DIAGNOSIS — M542 Cervicalgia: Secondary | ICD-10-CM | POA: Diagnosis not present

## 2022-07-09 DIAGNOSIS — M545 Low back pain, unspecified: Secondary | ICD-10-CM | POA: Diagnosis not present

## 2022-07-20 DIAGNOSIS — M4804 Spinal stenosis, thoracic region: Secondary | ICD-10-CM | POA: Diagnosis not present

## 2022-07-20 DIAGNOSIS — I456 Pre-excitation syndrome: Secondary | ICD-10-CM | POA: Diagnosis not present

## 2022-07-20 DIAGNOSIS — M810 Age-related osteoporosis without current pathological fracture: Secondary | ICD-10-CM | POA: Diagnosis not present

## 2022-07-20 DIAGNOSIS — Z9889 Other specified postprocedural states: Secondary | ICD-10-CM | POA: Diagnosis not present

## 2022-07-20 DIAGNOSIS — M48062 Spinal stenosis, lumbar region with neurogenic claudication: Secondary | ICD-10-CM | POA: Diagnosis not present

## 2022-07-20 DIAGNOSIS — E039 Hypothyroidism, unspecified: Secondary | ICD-10-CM | POA: Diagnosis not present

## 2022-07-20 DIAGNOSIS — G2581 Restless legs syndrome: Secondary | ICD-10-CM | POA: Diagnosis not present

## 2022-07-20 DIAGNOSIS — E785 Hyperlipidemia, unspecified: Secondary | ICD-10-CM | POA: Diagnosis not present

## 2022-07-20 DIAGNOSIS — G992 Myelopathy in diseases classified elsewhere: Secondary | ICD-10-CM | POA: Diagnosis not present

## 2022-07-20 DIAGNOSIS — K219 Gastro-esophageal reflux disease without esophagitis: Secondary | ICD-10-CM | POA: Diagnosis not present

## 2022-07-21 DIAGNOSIS — M810 Age-related osteoporosis without current pathological fracture: Secondary | ICD-10-CM | POA: Diagnosis not present

## 2022-07-21 DIAGNOSIS — E039 Hypothyroidism, unspecified: Secondary | ICD-10-CM | POA: Diagnosis not present

## 2022-07-21 DIAGNOSIS — I456 Pre-excitation syndrome: Secondary | ICD-10-CM | POA: Diagnosis not present

## 2022-07-21 DIAGNOSIS — K219 Gastro-esophageal reflux disease without esophagitis: Secondary | ICD-10-CM | POA: Diagnosis not present

## 2022-07-21 DIAGNOSIS — Z9889 Other specified postprocedural states: Secondary | ICD-10-CM

## 2022-07-21 DIAGNOSIS — M48062 Spinal stenosis, lumbar region with neurogenic claudication: Secondary | ICD-10-CM | POA: Diagnosis not present

## 2022-07-21 DIAGNOSIS — Z5189 Encounter for other specified aftercare: Secondary | ICD-10-CM | POA: Diagnosis not present

## 2022-07-21 DIAGNOSIS — G992 Myelopathy in diseases classified elsewhere: Secondary | ICD-10-CM | POA: Diagnosis not present

## 2022-07-21 DIAGNOSIS — G2581 Restless legs syndrome: Secondary | ICD-10-CM | POA: Diagnosis not present

## 2022-07-21 DIAGNOSIS — E785 Hyperlipidemia, unspecified: Secondary | ICD-10-CM | POA: Diagnosis not present

## 2022-07-21 DIAGNOSIS — M4804 Spinal stenosis, thoracic region: Secondary | ICD-10-CM | POA: Diagnosis not present

## 2022-07-21 HISTORY — DX: Other specified postprocedural states: Z98.890

## 2022-07-23 DIAGNOSIS — Z6823 Body mass index (BMI) 23.0-23.9, adult: Secondary | ICD-10-CM | POA: Diagnosis not present

## 2022-07-23 DIAGNOSIS — L239 Allergic contact dermatitis, unspecified cause: Secondary | ICD-10-CM | POA: Diagnosis not present

## 2022-08-04 DIAGNOSIS — Z4789 Encounter for other orthopedic aftercare: Secondary | ICD-10-CM | POA: Diagnosis not present

## 2022-08-04 DIAGNOSIS — Z9889 Other specified postprocedural states: Secondary | ICD-10-CM | POA: Diagnosis not present

## 2022-08-11 DIAGNOSIS — Z6823 Body mass index (BMI) 23.0-23.9, adult: Secondary | ICD-10-CM | POA: Diagnosis not present

## 2022-08-11 DIAGNOSIS — L309 Dermatitis, unspecified: Secondary | ICD-10-CM | POA: Diagnosis not present

## 2022-08-11 DIAGNOSIS — L239 Allergic contact dermatitis, unspecified cause: Secondary | ICD-10-CM | POA: Diagnosis not present

## 2022-08-11 DIAGNOSIS — R2681 Unsteadiness on feet: Secondary | ICD-10-CM | POA: Diagnosis not present

## 2022-08-18 DIAGNOSIS — Z4789 Encounter for other orthopedic aftercare: Secondary | ICD-10-CM | POA: Diagnosis not present

## 2022-08-18 DIAGNOSIS — Z9889 Other specified postprocedural states: Secondary | ICD-10-CM | POA: Diagnosis not present

## 2022-08-26 DIAGNOSIS — D649 Anemia, unspecified: Secondary | ICD-10-CM | POA: Diagnosis not present

## 2022-08-26 DIAGNOSIS — E162 Hypoglycemia, unspecified: Secondary | ICD-10-CM | POA: Diagnosis not present

## 2022-09-03 DIAGNOSIS — M545 Low back pain, unspecified: Secondary | ICD-10-CM | POA: Diagnosis not present

## 2022-09-03 DIAGNOSIS — M6281 Muscle weakness (generalized): Secondary | ICD-10-CM | POA: Diagnosis not present

## 2022-09-08 DIAGNOSIS — M545 Low back pain, unspecified: Secondary | ICD-10-CM | POA: Diagnosis not present

## 2022-09-14 DIAGNOSIS — N3001 Acute cystitis with hematuria: Secondary | ICD-10-CM | POA: Diagnosis not present

## 2022-09-14 DIAGNOSIS — Z6823 Body mass index (BMI) 23.0-23.9, adult: Secondary | ICD-10-CM | POA: Diagnosis not present

## 2022-09-14 DIAGNOSIS — R413 Other amnesia: Secondary | ICD-10-CM | POA: Diagnosis not present

## 2022-09-14 DIAGNOSIS — F3341 Major depressive disorder, recurrent, in partial remission: Secondary | ICD-10-CM | POA: Diagnosis not present

## 2022-09-15 DIAGNOSIS — M545 Low back pain, unspecified: Secondary | ICD-10-CM | POA: Diagnosis not present

## 2022-09-17 DIAGNOSIS — M545 Low back pain, unspecified: Secondary | ICD-10-CM | POA: Diagnosis not present

## 2022-09-22 DIAGNOSIS — M545 Low back pain, unspecified: Secondary | ICD-10-CM | POA: Diagnosis not present

## 2022-09-24 DIAGNOSIS — M545 Low back pain, unspecified: Secondary | ICD-10-CM | POA: Diagnosis not present

## 2022-10-01 DIAGNOSIS — M545 Low back pain, unspecified: Secondary | ICD-10-CM | POA: Diagnosis not present

## 2022-10-08 DIAGNOSIS — E162 Hypoglycemia, unspecified: Secondary | ICD-10-CM | POA: Diagnosis not present

## 2022-10-08 DIAGNOSIS — Z6824 Body mass index (BMI) 24.0-24.9, adult: Secondary | ICD-10-CM | POA: Diagnosis not present

## 2022-10-14 DIAGNOSIS — Z4889 Encounter for other specified surgical aftercare: Secondary | ICD-10-CM | POA: Diagnosis not present

## 2022-10-14 DIAGNOSIS — Z9889 Other specified postprocedural states: Secondary | ICD-10-CM | POA: Diagnosis not present

## 2022-10-23 DIAGNOSIS — L255 Unspecified contact dermatitis due to plants, except food: Secondary | ICD-10-CM | POA: Diagnosis not present

## 2022-10-23 DIAGNOSIS — Z6824 Body mass index (BMI) 24.0-24.9, adult: Secondary | ICD-10-CM | POA: Diagnosis not present

## 2022-12-01 DIAGNOSIS — M5441 Lumbago with sciatica, right side: Secondary | ICD-10-CM | POA: Diagnosis not present

## 2022-12-01 DIAGNOSIS — N3001 Acute cystitis with hematuria: Secondary | ICD-10-CM | POA: Diagnosis not present

## 2022-12-01 DIAGNOSIS — M791 Myalgia, unspecified site: Secondary | ICD-10-CM | POA: Diagnosis not present

## 2022-12-01 DIAGNOSIS — M5442 Lumbago with sciatica, left side: Secondary | ICD-10-CM | POA: Diagnosis not present

## 2022-12-01 DIAGNOSIS — Z1331 Encounter for screening for depression: Secondary | ICD-10-CM | POA: Diagnosis not present

## 2022-12-01 DIAGNOSIS — Z1339 Encounter for screening examination for other mental health and behavioral disorders: Secondary | ICD-10-CM | POA: Diagnosis not present

## 2022-12-01 DIAGNOSIS — N368 Other specified disorders of urethra: Secondary | ICD-10-CM | POA: Diagnosis not present

## 2022-12-01 DIAGNOSIS — N958 Other specified menopausal and perimenopausal disorders: Secondary | ICD-10-CM | POA: Diagnosis not present

## 2022-12-01 DIAGNOSIS — Z6824 Body mass index (BMI) 24.0-24.9, adult: Secondary | ICD-10-CM | POA: Diagnosis not present

## 2023-01-05 ENCOUNTER — Other Ambulatory Visit: Payer: Self-pay

## 2023-01-05 MED ORDER — FLECAINIDE ACETATE 100 MG PO TABS: 100.0000 mg | ORAL_TABLET | Freq: Two times a day (BID) | ORAL | 0 refills | Status: DC

## 2023-01-08 DIAGNOSIS — S76301A Unspecified injury of muscle, fascia and tendon of the posterior muscle group at thigh level, right thigh, initial encounter: Secondary | ICD-10-CM | POA: Diagnosis not present

## 2023-01-08 DIAGNOSIS — S29012A Strain of muscle and tendon of back wall of thorax, initial encounter: Secondary | ICD-10-CM | POA: Diagnosis not present

## 2023-01-08 DIAGNOSIS — N3001 Acute cystitis with hematuria: Secondary | ICD-10-CM | POA: Diagnosis not present

## 2023-01-08 DIAGNOSIS — Z6825 Body mass index (BMI) 25.0-25.9, adult: Secondary | ICD-10-CM | POA: Diagnosis not present

## 2023-01-13 DIAGNOSIS — R2689 Other abnormalities of gait and mobility: Secondary | ICD-10-CM | POA: Insufficient documentation

## 2023-01-13 HISTORY — DX: Other abnormalities of gait and mobility: R26.89

## 2023-01-14 DIAGNOSIS — R2689 Other abnormalities of gait and mobility: Secondary | ICD-10-CM | POA: Diagnosis not present

## 2023-01-14 DIAGNOSIS — Z9889 Other specified postprocedural states: Secondary | ICD-10-CM | POA: Diagnosis not present

## 2023-01-14 DIAGNOSIS — M810 Age-related osteoporosis without current pathological fracture: Secondary | ICD-10-CM | POA: Diagnosis not present

## 2023-01-14 DIAGNOSIS — Z981 Arthrodesis status: Secondary | ICD-10-CM | POA: Diagnosis not present

## 2023-01-21 ENCOUNTER — Ambulatory Visit: Payer: Medicare HMO | Attending: Cardiology | Admitting: Cardiology

## 2023-01-21 ENCOUNTER — Encounter: Payer: Self-pay | Admitting: Cardiology

## 2023-01-21 VITALS — BP 133/75 | HR 63 | Ht 63.0 in | Wt 163.0 lb

## 2023-01-21 DIAGNOSIS — E782 Mixed hyperlipidemia: Secondary | ICD-10-CM | POA: Diagnosis not present

## 2023-01-21 DIAGNOSIS — Z5181 Encounter for therapeutic drug level monitoring: Secondary | ICD-10-CM

## 2023-01-21 DIAGNOSIS — Z79899 Other long term (current) drug therapy: Secondary | ICD-10-CM | POA: Diagnosis not present

## 2023-01-21 DIAGNOSIS — I493 Ventricular premature depolarization: Secondary | ICD-10-CM | POA: Diagnosis not present

## 2023-01-21 MED ORDER — FLECAINIDE ACETATE 100 MG PO TABS: 100.0000 mg | ORAL_TABLET | Freq: Two times a day (BID) | ORAL | 3 refills | Status: DC

## 2023-01-21 NOTE — Patient Instructions (Signed)
Medication Instructions:  Your physician recommends that you continue on your current medications as directed. Please refer to the Current Medication list given to you today.  *If you need a refill on your cardiac medications before your next appointment, please call your pharmacy*   Lab Work: None If you have labs (blood work) drawn today and your tests are completely normal, you will receive your results only by: MyChart Message (if you have MyChart) OR A paper copy in the mail If you have any lab test that is abnormal or we need to change your treatment, we will call you to review the results.   Testing/Procedures: Referral To Electrophysiology- Dr. Elberta Fortis   Follow-Up: At [facility], you and your health needs are our priority.  As part of our continuing mission to provide you with exceptional heart care, we have created designated Provider Care Teams.  These Care Teams include your primary Cardiologist (physician) and Advanced Practice Providers (APPs -  Physician Assistants and Nurse Practitioners) who all work together to provide you with the care you need, when you need it.  We recommend signing up for the patient portal called "MyChart".  Sign up information is provided on this After Visit Summary.  MyChart is used to connect with patients for Virtual Visits (Telemedicine).  Patients are able to view lab/test results, encounter notes, upcoming appointments, etc.  Non-urgent messages can be sent to your provider as well.   To learn more about what you can do with MyChart, go to [email or web address].    Your next appointment:   9 month(s)  Provider:   Belva Crome, MD    Other Instructions

## 2023-01-21 NOTE — Progress Notes (Signed)
Cardiology Office Note:    Date:  01/21/2023   ID:  Laura Singleton, DOB 01-08-1946, MRN [id number]  PCP:  Buckner Malta, MD  Cardiologist:  Garwin Brothers, MD   Referring MD: Buckner Malta, MD    ASSESSMENT:    1. Mixed hyperlipidemia   2. PVC (premature ventricular contraction)   3. Encounter for monitoring flecainide therapy    PLAN:    In order of problems listed above:  I discussed my findings with the patient at length.  Primary prevention stressed.  Importance of compliance with diet medication stressed and she vocalized understanding.  She was advised to walk at least half an hour a day 5 days a week and she promises to do so. Frequent PVCs on flecainide therapy: Stable and asymptomatic.  I will refer her to Dr. Elberta Fortis for follow-up on this and advise for the future continuation of this therapy. Obesity and sedentary lifestyle: I cautioned patient against this and she promises to do better.  Exercise stressed. Mixed dyslipidemia: On lipid-lowering therapy followed by primary care.  Diet emphasized. Patient will be seen in follow-up appointment in 6 months or earlier if the patient has any concerns.    Medication Adjustments/Labs and Tests Ordered: Current medicines are reviewed at length with the patient today.  Concerns regarding medicines are outlined above.  Orders Placed This Encounter  Procedures   EKG 12-Lead   Meds ordered this encounter  Medications   flecainide (TAMBOCOR) 100 MG tablet    Sig: Take 1 tablet (100 mg total) by mouth 2 (two) times daily. Patient must keep appointment for 01/21/23 for further refills. 1 st attempt    Dispense:  180 tablet    Refill:  3    Patient must keep appointment for 01/21/23 for further refills. 1 st attempt     No chief complaint on file.    History of Present Illness:    Laura Singleton is a 77 y.o. female.  Patient has past medical history of frequent PVCs on flecainide therapy.  She mentions to me that several  years ago Dr. Graciela Husbands initiated her on this medication.  She also has history of mixed dyslipidemia and obesity.  She leads a sedentary lifestyle.  She denies any chest pain orthopnea or PND.  At the time of my evaluation, the patient is alert awake oriented and in no distress.  Past Medical History:  Diagnosis Date   Ankle weakness 07/15/2021   Arthritis    Body mass index (BMI) 26.0-26.9, adult 06/20/2020   Cardiac arrhythmia 03/28/2020   Cardiac murmur 05/29/2020   Carpal tunnel syndrome 06/07/2018   Cervical myelopathy (HCC) 11/05/2016   Cervical radiculopathy 10/06/2016   Chronic pain of left ankle 07/15/2021   Chronic pain of left knee 11/17/2018   Depression    Dysrhythmia    Elevated blood pressure reading in office with white coat syndrome, without diagnosis of hypertension 05/07/2021   Encounter for monitoring flecainide therapy 05/29/2020   GERD (gastroesophageal reflux disease)    History of total knee arthroplasty 12/26/2019   Hyperlipidemia 11/05/2016   Hypothyroidism    Myelopathy of cervical spinal cord with cervical radiculopathy (HCC) 11/05/2016   Neuropathy 05/31/2018   Oropharyngeal dysphagia 12/01/2016   Osteoarthritis of right knee 01/29/2021   Preoperative cardiovascular examination 11/21/2019   PVC (premature ventricular contraction) 11/05/2016   PVC's (premature ventricular contractions)    S/P total knee arthroplasty, left 12/26/2019   S/P total knee arthroplasty, right 02/11/2021   Skin cancer of  face    Spinal stenosis 07/17/2020   Spinal stenosis in cervical region 10/06/2016   Ulnar neuropathy of both upper extremities 06/20/2020   Ventricular premature beats 11/05/2016   Wolff-Parkinson-White (WPW) pattern 11/05/2016   Wolff-Parkinson-White (WPW) syndrome    WPW (Wolff-Parkinson-White syndrome) 11/05/2016    Past Surgical History:  Procedure Laterality Date   ANTERIOR CERVICAL DECOMP/DISCECTOMY FUSION N/A 11/05/2016   Procedure: ANTERIOR CERVICAL DECOMPRESSION/DISCECTOMY  FUSION  - CERVICAL THREE-FOUR, CERVICAL FIVE-SIX, CERVICAL SIX -SEVEN;  Surgeon: Donalee Citrin, MD;  [facility]: [facility];  Service: Neurosurgery;  Laterality: N/A;   COLONOSCOPY     EYE SURGERY     both eyes lens implants   LUMBAR LAMINECTOMY/DECOMPRESSION MICRODISCECTOMY Bilateral 07/17/2020   Procedure: Laminectomy and Foraminotomy - bilateral - Thoracic ten-Thoracic eleven - Lumbar one-Lumbar two;  Surgeon: Donalee Citrin, MD;  Location: [facility];  Service: Neurosurgery;  Laterality: Bilateral;   SKIN CANCER EXCISION     Face   thumb surgery Right    TONSILLECTOMY     TOTAL KNEE ARTHROPLASTY Left 12/26/2019   Procedure: TOTAL KNEE ARTHROPLASTY;  Surgeon: Durene Romans, MD;  Location: [facility];  Service: Orthopedics;  Laterality: Left;  70 mins   TOTAL KNEE ARTHROPLASTY Right 02/11/2021   Procedure: TOTAL KNEE ARTHROPLASTY;  Surgeon: Durene Romans, MD;  [facility];  Service: Orthopedics;  Laterality: Right;   TUBAL LIGATION      Current Medications: Current Meds  Medication Sig   alendronate (FOSAMAX) 70 MG tablet Take 70 mg by mouth once a week.   atorvastatin (LIPITOR) 20 MG tablet Take 20 mg by mouth at bedtime.   calcium carbonate (OS-CAL) 600 MG TABS tablet Take 1,200 mg by mouth at bedtime.   levothyroxine (SYNTHROID) 112 MCG tablet Take 112 mcg by mouth daily before breakfast.   Multiple Vitamin (MULTIVITAMIN WITH MINERALS) TABS tablet Take 1 tablet by mouth daily. Centrum Silver   omeprazole (PRILOSEC) 40 MG capsule Take 40 mg by mouth daily.   ropinirole (REQUIP) 5 MG tablet Take 5 mg by mouth at bedtime.   sertraline (ZOLOFT) 100 MG tablet Take 200 mg by mouth daily.   traZODone (DESYREL) 100 MG tablet Take 100 mg by mouth at bedtime.   [DISCONTINUED] flecainide (TAMBOCOR) 100 MG tablet Take 1 tablet (100 mg total) by mouth 2 (two) times daily. Patient must keep appointment for 01/21/23 for further refills. 1 st attempt     Allergies:   Bee venom, Parafon forte dsc [chlorzoxazone],  and Sulfa antibiotics   Social History   Socioeconomic History   Marital status: Widowed    Spouse name: Not on file   Number of children: Not on file   Years of education: Not on file   Highest education level: Not on file  Occupational History   Not on file  Tobacco Use   Smoking status: Former    Types: Cigarettes   Smokeless tobacco: Never   Tobacco comments:    Quit 40 years ago  Vaping Use   Vaping status: Never Used  Substance and Sexual Activity   Alcohol use: No   Drug use: No   Sexual activity: Not on file  Other Topics Concern   Not on file  Social History Narrative   Not on file   Social Determinants of Health   Financial Resource Strain: Not on file  Food Insecurity: Low Risk  (07/20/2022)   Received from [facility]   Hunger Vital Sign    Worried About Running Out of  Food in the Last Year: Never true    Ran Out of Food in the Last Year: Never true  Transportation Needs: No Transportation Needs (07/20/2022)   Received from [facility]    In the past 12 months, has lack of reliable transportation kept you from medical appointments, meetings, work or from getting things needed for daily living? : No  Physical Activity: Not on file  Stress: Not on file  Social Connections: Not on file     Family History: The patient's family history includes High Cholesterol in her mother; High blood pressure in her brother and mother.  ROS:   Please see the history of present illness.    All other systems reviewed and are negative.  EKGs/Labs/Other Studies Reviewed:    The following studies were reviewed today: .Marland KitchenEKG Interpretation Date/Time:  Thursday January 21 2023 13:48:46 EST Ventricular Rate:  63 PR Interval:  162 QRS Duration:  94 QT Interval:  456 QTC Calculation: 466 R Axis:   16  Text Interpretation: Normal sinus rhythm Cannot rule out Anterior infarct , age undetermined Abnormal ECG When compared with ECG of 29-Oct-2016 09:49,  No significant change was found Confirmed by Belva Crome [phone]) on 01/21/2023 1:05:40 PM     Recent Labs: No results found for requested labs within last 365 days.  Recent Lipid Panel No results found for: "CHOL", "TRIG", "HDL", "CHOLHDL", "VLDL", "LDLCALC", "LDLDIRECT"  Physical Exam:    VS:  BP 133/75   Pulse 63   Ht 5\' 3"  (1.6 m)   Wt 163 lb (73.9 kg)   SpO2 95%   BMI 28.87 kg/m     Wt Readings from Last 3 Encounters:  01/21/23 163 lb (73.9 kg)  05/11/22 147 lb (66.7 kg)  11/13/21 140 lb 9.6 oz (63.8 kg)     GEN: Patient is in no acute distress HEENT: Normal NECK: No JVD; No carotid bruits LYMPHATICS: No lymphadenopathy CARDIAC: Hear sounds regular, 2/6 systolic murmur at the apex. RESPIRATORY:  Clear to auscultation without rales, wheezing or rhonchi  ABDOMEN: Soft, non-tender, non-distended MUSCULOSKELETAL:  No edema; No deformity  SKIN: Warm and dry NEUROLOGIC:  Alert and oriented x 3 PSYCHIATRIC:  Normal affect   Signed, Garwin Brothers, MD  01/21/2023 1:17 PM    [facility]

## 2023-02-01 ENCOUNTER — Other Ambulatory Visit: Payer: Self-pay | Admitting: Cardiology

## 2023-02-09 DIAGNOSIS — R2689 Other abnormalities of gait and mobility: Secondary | ICD-10-CM | POA: Diagnosis not present

## 2023-02-09 DIAGNOSIS — Z981 Arthrodesis status: Secondary | ICD-10-CM | POA: Diagnosis not present

## 2023-02-09 DIAGNOSIS — M48061 Spinal stenosis, lumbar region without neurogenic claudication: Secondary | ICD-10-CM | POA: Diagnosis not present

## 2023-02-09 DIAGNOSIS — M810 Age-related osteoporosis without current pathological fracture: Secondary | ICD-10-CM | POA: Diagnosis not present

## 2023-02-09 DIAGNOSIS — M542 Cervicalgia: Secondary | ICD-10-CM | POA: Diagnosis not present

## 2023-02-09 DIAGNOSIS — Z780 Asymptomatic menopausal state: Secondary | ICD-10-CM | POA: Diagnosis not present

## 2023-02-09 DIAGNOSIS — M85852 Other specified disorders of bone density and structure, left thigh: Secondary | ICD-10-CM | POA: Diagnosis not present

## 2023-02-09 DIAGNOSIS — Z9889 Other specified postprocedural states: Secondary | ICD-10-CM | POA: Diagnosis not present

## 2023-02-25 DIAGNOSIS — Z23 Encounter for immunization: Secondary | ICD-10-CM | POA: Diagnosis not present

## 2023-03-29 DIAGNOSIS — Z6825 Body mass index (BMI) 25.0-25.9, adult: Secondary | ICD-10-CM | POA: Diagnosis not present

## 2023-03-29 DIAGNOSIS — M791 Myalgia, unspecified site: Secondary | ICD-10-CM | POA: Diagnosis not present

## 2023-04-14 DIAGNOSIS — Z6825 Body mass index (BMI) 25.0-25.9, adult: Secondary | ICD-10-CM | POA: Diagnosis not present

## 2023-04-14 DIAGNOSIS — N3001 Acute cystitis with hematuria: Secondary | ICD-10-CM | POA: Diagnosis not present

## 2023-04-26 DIAGNOSIS — M25472 Effusion, left ankle: Secondary | ICD-10-CM | POA: Diagnosis not present

## 2023-04-26 DIAGNOSIS — N390 Urinary tract infection, site not specified: Secondary | ICD-10-CM | POA: Diagnosis not present

## 2023-04-26 DIAGNOSIS — S99922A Unspecified injury of left foot, initial encounter: Secondary | ICD-10-CM | POA: Diagnosis not present

## 2023-04-26 DIAGNOSIS — R0989 Other specified symptoms and signs involving the circulatory and respiratory systems: Secondary | ICD-10-CM | POA: Diagnosis not present

## 2023-04-26 DIAGNOSIS — Z6825 Body mass index (BMI) 25.0-25.9, adult: Secondary | ICD-10-CM | POA: Diagnosis not present

## 2023-05-03 ENCOUNTER — Encounter: Payer: Self-pay | Admitting: Cardiology

## 2023-05-03 ENCOUNTER — Ambulatory Visit: Payer: Medicare HMO | Attending: Cardiology | Admitting: Cardiology

## 2023-05-03 VITALS — BP 124/80 | HR 56 | Ht 63.0 in | Wt 164.0 lb

## 2023-05-03 DIAGNOSIS — I456 Pre-excitation syndrome: Secondary | ICD-10-CM | POA: Diagnosis not present

## 2023-05-03 DIAGNOSIS — I493 Ventricular premature depolarization: Secondary | ICD-10-CM | POA: Diagnosis not present

## 2023-05-03 DIAGNOSIS — Z79899 Other long term (current) drug therapy: Secondary | ICD-10-CM | POA: Diagnosis not present

## 2023-05-03 NOTE — Progress Notes (Signed)
  Electrophysiology Office Note:   Date:  05/03/2023  ID:  Laura Singleton, DOB Nov 08, 1945, MRN [id number]  Primary Cardiologist: None Primary Heart Failure: None Electrophysiologist: Laura Monday Jorja Loa, MD      History of Present Illness:   Laura Singleton is a 78 y.o. female with h/o PVCs, Wolff-Parkinson-White syndrome seen today for routine electrophysiology followup.   Since last being seen in our clinic the patient reports doing overall well.  She has no chest pain or shortness of breath.  She is unaware of palpitations.  She is able to do all of her daily activities without restriction.  She understand that she needs to exercise more and Laura Singleton try and exercise 30 minutes a day.  Aside from that, she has no acute complaints.  she denies chest pain, palpitations, dyspnea, PND, orthopnea, nausea, vomiting, dizziness, syncope, edema, weight gain, or early satiety.   Review of systems complete and found to be negative unless listed in HPI.   EP Information / Studies Reviewed:    EKG is ordered today. Personal review as below.  EKG Interpretation Date/Time:  Monday May 03 2023 11:49:55 EST Ventricular Rate:  56 PR Interval:  190 QRS Duration:  92 QT Interval:  452 QTC Calculation: 436 R Axis:   -3  Text Interpretation: Sinus bradycardia Otherwise normal ECG When compared with ECG of 21-Jan-2023 13:48, No significant change was found Confirmed by Laura Singleton ([id number]) on 05/03/2023 11:54:25 AM     Risk Assessment/Calculations:              Physical Exam:   VS:  BP 124/80   Pulse (!) 56   Ht 5\' 3"  (1.6 m)   Wt 164 lb (74.4 kg)   SpO2 93%   BMI 29.05 kg/m    Wt Readings from Last 3 Encounters:  05/03/23 164 lb (74.4 kg)  01/21/23 163 lb (73.9 kg)  05/11/22 147 lb (66.7 kg)     GEN: Well nourished, well developed in no acute distress NECK: No JVD; No carotid bruits CARDIAC: Regular rate and rhythm, no murmurs, rubs, gallops RESPIRATORY:  Clear to auscultation without rales,  wheezing or rhonchi  ABDOMEN: Soft, non-tender, non-distended EXTREMITIES:  No edema; No deformity   ASSESSMENT AND PLAN:    1.  PVCs: Currently on flecainide 100 mg twice daily.  No PVCs on EKG today.  Feeling well.  No changes.  2.  Wolff-Parkinson-White syndrome: Seen by EP in 2012 and was started on flecainide.  No preexcitation on EKG today.  No symptoms of palpitations.  Continue with current management.  3.  High risk medication monitoring: Currently on flecainide.  EKG with normal intervals and no QRS widening.  Laura Singleton continue with current management.  Follow up with Dr. Elberta Singleton in 12 months  Signed, Laura Falotico Jorja Loa, MD

## 2023-05-11 DIAGNOSIS — L299 Pruritus, unspecified: Secondary | ICD-10-CM | POA: Diagnosis not present

## 2023-05-11 DIAGNOSIS — Z6825 Body mass index (BMI) 25.0-25.9, adult: Secondary | ICD-10-CM | POA: Diagnosis not present

## 2023-05-11 DIAGNOSIS — F3341 Major depressive disorder, recurrent, in partial remission: Secondary | ICD-10-CM | POA: Diagnosis not present

## 2023-06-17 DIAGNOSIS — N1831 Chronic kidney disease, stage 3a: Secondary | ICD-10-CM | POA: Diagnosis not present

## 2023-06-17 DIAGNOSIS — I4891 Unspecified atrial fibrillation: Secondary | ICD-10-CM | POA: Diagnosis not present

## 2023-06-17 DIAGNOSIS — R7302 Impaired glucose tolerance (oral): Secondary | ICD-10-CM | POA: Diagnosis not present

## 2023-06-17 DIAGNOSIS — G2581 Restless legs syndrome: Secondary | ICD-10-CM | POA: Diagnosis not present

## 2023-06-17 DIAGNOSIS — Z79899 Other long term (current) drug therapy: Secondary | ICD-10-CM | POA: Diagnosis not present

## 2023-06-17 DIAGNOSIS — Z0000 Encounter for general adult medical examination without abnormal findings: Secondary | ICD-10-CM | POA: Diagnosis not present

## 2023-06-17 DIAGNOSIS — M810 Age-related osteoporosis without current pathological fracture: Secondary | ICD-10-CM | POA: Diagnosis not present

## 2023-06-17 DIAGNOSIS — E785 Hyperlipidemia, unspecified: Secondary | ICD-10-CM | POA: Diagnosis not present

## 2023-06-17 DIAGNOSIS — E039 Hypothyroidism, unspecified: Secondary | ICD-10-CM | POA: Diagnosis not present

## 2023-06-23 DIAGNOSIS — S161XXA Strain of muscle, fascia and tendon at neck level, initial encounter: Secondary | ICD-10-CM | POA: Diagnosis not present

## 2023-06-23 DIAGNOSIS — M545 Low back pain, unspecified: Secondary | ICD-10-CM | POA: Diagnosis not present

## 2023-06-23 DIAGNOSIS — S39012A Strain of muscle, fascia and tendon of lower back, initial encounter: Secondary | ICD-10-CM | POA: Diagnosis not present

## 2023-06-23 DIAGNOSIS — M25511 Pain in right shoulder: Secondary | ICD-10-CM | POA: Diagnosis not present

## 2023-06-23 DIAGNOSIS — Z041 Encounter for examination and observation following transport accident: Secondary | ICD-10-CM | POA: Diagnosis not present

## 2023-08-31 DIAGNOSIS — Z1231 Encounter for screening mammogram for malignant neoplasm of breast: Secondary | ICD-10-CM | POA: Diagnosis not present

## 2023-09-01 DIAGNOSIS — Z96653 Presence of artificial knee joint, bilateral: Secondary | ICD-10-CM | POA: Diagnosis not present

## 2023-09-01 DIAGNOSIS — S8002XA Contusion of left knee, initial encounter: Secondary | ICD-10-CM | POA: Diagnosis not present

## 2023-09-01 DIAGNOSIS — S8001XA Contusion of right knee, initial encounter: Secondary | ICD-10-CM | POA: Diagnosis not present

## 2023-09-09 DIAGNOSIS — M545 Low back pain, unspecified: Secondary | ICD-10-CM | POA: Diagnosis not present

## 2023-09-13 DIAGNOSIS — L720 Epidermal cyst: Secondary | ICD-10-CM | POA: Diagnosis not present

## 2023-09-14 DIAGNOSIS — M545 Low back pain, unspecified: Secondary | ICD-10-CM | POA: Diagnosis not present

## 2023-09-16 DIAGNOSIS — M545 Low back pain, unspecified: Secondary | ICD-10-CM | POA: Diagnosis not present

## 2023-09-21 DIAGNOSIS — M545 Low back pain, unspecified: Secondary | ICD-10-CM | POA: Diagnosis not present

## 2023-09-22 DIAGNOSIS — D485 Neoplasm of uncertain behavior of skin: Secondary | ICD-10-CM | POA: Diagnosis not present

## 2023-09-23 DIAGNOSIS — M545 Low back pain, unspecified: Secondary | ICD-10-CM | POA: Diagnosis not present

## 2023-09-28 DIAGNOSIS — M545 Low back pain, unspecified: Secondary | ICD-10-CM | POA: Diagnosis not present

## 2023-09-30 DIAGNOSIS — M545 Low back pain, unspecified: Secondary | ICD-10-CM | POA: Diagnosis not present

## 2023-10-05 DIAGNOSIS — M545 Low back pain, unspecified: Secondary | ICD-10-CM | POA: Diagnosis not present

## 2023-10-06 DIAGNOSIS — I4891 Unspecified atrial fibrillation: Secondary | ICD-10-CM | POA: Diagnosis not present

## 2023-10-06 DIAGNOSIS — F329 Major depressive disorder, single episode, unspecified: Secondary | ICD-10-CM | POA: Diagnosis not present

## 2023-10-06 DIAGNOSIS — E785 Hyperlipidemia, unspecified: Secondary | ICD-10-CM | POA: Diagnosis not present

## 2023-10-06 DIAGNOSIS — N1831 Chronic kidney disease, stage 3a: Secondary | ICD-10-CM | POA: Diagnosis not present

## 2023-10-06 DIAGNOSIS — Z6825 Body mass index (BMI) 25.0-25.9, adult: Secondary | ICD-10-CM | POA: Diagnosis not present

## 2023-10-07 DIAGNOSIS — M545 Low back pain, unspecified: Secondary | ICD-10-CM | POA: Diagnosis not present

## 2023-10-12 DIAGNOSIS — M545 Low back pain, unspecified: Secondary | ICD-10-CM | POA: Diagnosis not present

## 2023-10-14 DIAGNOSIS — M545 Low back pain, unspecified: Secondary | ICD-10-CM | POA: Diagnosis not present

## 2023-10-19 DIAGNOSIS — M545 Low back pain, unspecified: Secondary | ICD-10-CM | POA: Diagnosis not present

## 2023-10-21 DIAGNOSIS — M545 Low back pain, unspecified: Secondary | ICD-10-CM | POA: Diagnosis not present

## 2023-10-26 DIAGNOSIS — M545 Low back pain, unspecified: Secondary | ICD-10-CM | POA: Diagnosis not present

## 2023-10-28 DIAGNOSIS — M545 Low back pain, unspecified: Secondary | ICD-10-CM | POA: Diagnosis not present

## 2023-11-02 DIAGNOSIS — M545 Low back pain, unspecified: Secondary | ICD-10-CM | POA: Diagnosis not present

## 2023-11-04 DIAGNOSIS — M545 Low back pain, unspecified: Secondary | ICD-10-CM | POA: Diagnosis not present

## 2023-11-09 DIAGNOSIS — M545 Low back pain, unspecified: Secondary | ICD-10-CM | POA: Diagnosis not present

## 2023-11-11 DIAGNOSIS — M545 Low back pain, unspecified: Secondary | ICD-10-CM | POA: Diagnosis not present

## 2023-11-15 ENCOUNTER — Ambulatory Visit

## 2023-11-16 DIAGNOSIS — M545 Low back pain, unspecified: Secondary | ICD-10-CM | POA: Diagnosis not present

## 2023-11-17 ENCOUNTER — Ambulatory Visit: Admitting: Cardiology

## 2023-11-18 DIAGNOSIS — M545 Low back pain, unspecified: Secondary | ICD-10-CM | POA: Diagnosis not present

## 2023-11-23 DIAGNOSIS — M545 Low back pain, unspecified: Secondary | ICD-10-CM | POA: Diagnosis not present

## 2023-11-25 DIAGNOSIS — M545 Low back pain, unspecified: Secondary | ICD-10-CM | POA: Diagnosis not present

## 2023-12-03 DIAGNOSIS — L720 Epidermal cyst: Secondary | ICD-10-CM | POA: Diagnosis not present

## 2023-12-06 DIAGNOSIS — Z961 Presence of intraocular lens: Secondary | ICD-10-CM | POA: Diagnosis not present

## 2023-12-21 DIAGNOSIS — S9032XA Contusion of left foot, initial encounter: Secondary | ICD-10-CM | POA: Diagnosis not present

## 2023-12-21 DIAGNOSIS — M7672 Peroneal tendinitis, left leg: Secondary | ICD-10-CM | POA: Diagnosis not present

## 2024-01-06 DIAGNOSIS — I781 Nevus, non-neoplastic: Secondary | ICD-10-CM | POA: Diagnosis not present

## 2024-01-06 DIAGNOSIS — Z6825 Body mass index (BMI) 25.0-25.9, adult: Secondary | ICD-10-CM | POA: Diagnosis not present

## 2024-01-06 DIAGNOSIS — Z23 Encounter for immunization: Secondary | ICD-10-CM | POA: Diagnosis not present

## 2024-01-06 DIAGNOSIS — N3001 Acute cystitis with hematuria: Secondary | ICD-10-CM | POA: Diagnosis not present

## 2024-01-06 DIAGNOSIS — I4891 Unspecified atrial fibrillation: Secondary | ICD-10-CM | POA: Diagnosis not present

## 2024-01-06 DIAGNOSIS — Z09 Encounter for follow-up examination after completed treatment for conditions other than malignant neoplasm: Secondary | ICD-10-CM | POA: Diagnosis not present

## 2024-01-06 DIAGNOSIS — R350 Frequency of micturition: Secondary | ICD-10-CM | POA: Diagnosis not present

## 2024-01-06 DIAGNOSIS — Z0000 Encounter for general adult medical examination without abnormal findings: Secondary | ICD-10-CM | POA: Diagnosis not present

## 2024-01-06 DIAGNOSIS — E039 Hypothyroidism, unspecified: Secondary | ICD-10-CM | POA: Diagnosis not present

## 2024-01-24 ENCOUNTER — Other Ambulatory Visit: Payer: Self-pay | Admitting: Cardiology

## 2024-02-10 DIAGNOSIS — R0609 Other forms of dyspnea: Secondary | ICD-10-CM | POA: Diagnosis not present

## 2024-03-16 ENCOUNTER — Ambulatory Visit: Attending: Cardiology | Admitting: Cardiology

## 2024-03-16 ENCOUNTER — Encounter: Payer: Self-pay | Admitting: Cardiology

## 2024-03-16 VITALS — BP 138/88 | HR 61 | Ht 63.0 in | Wt 171.4 lb

## 2024-03-16 DIAGNOSIS — I493 Ventricular premature depolarization: Secondary | ICD-10-CM

## 2024-03-16 DIAGNOSIS — I456 Pre-excitation syndrome: Secondary | ICD-10-CM | POA: Diagnosis not present

## 2024-03-16 DIAGNOSIS — E782 Mixed hyperlipidemia: Secondary | ICD-10-CM | POA: Diagnosis not present

## 2024-03-16 DIAGNOSIS — R0609 Other forms of dyspnea: Secondary | ICD-10-CM | POA: Diagnosis not present

## 2024-03-16 NOTE — Progress Notes (Signed)
 " Cardiology Office Note:    Date:  03/16/2024   ID:  Laura Singleton, DOB January 12, 1946, MRN [id number]  PCP:  Clemmie Nest, MD  Cardiologist:  Jennifer JONELLE Crape, MD   Referring MD: Clemmie Nest, MD    ASSESSMENT:    1. Mixed hyperlipidemia   2. PVC (premature ventricular contraction)   3. Wolff-Parkinson-White (WPW) pattern   4. DOE (dyspnea on exertion)    PLAN:    In order of problems listed above:  Primary prevention stressed with the patient.  Importance of compliance with diet medication stressed and patient verbalized standing. Frequent PVCs: On flecainide  and followed by electrophysiology clinics.  She is doing well with this.  EKG is unremarkable and I discussed this with her at length. And exertion: She is obese and leads a sedentary lifestyle and I would like to evaluate her for obstructive coronary artery disease.  She is also on antiarrhythmic therapy which cause for this evaluation.  I discussed various modalities and she prefers CT coronary angiography. Obesity: Weight reduction stressed diet emphasized and she promises to do better.  Risks of obesity. Flecainide  therapy: Helping her PVCs and she feels better. WPW pattern on EKG: Stable and monitoring. Patient will be seen in follow-up appointment in 6 months or earlier if the patient has any concerns.    Medication Adjustments/Labs and Tests Ordered: Current medicines are reviewed at length with the patient today.  Concerns regarding medicines are outlined above.  Orders Placed This Encounter  Procedures   CT CORONARY MORPH W/CTA COR W/SCORE W/CA W/CM &/OR WO/CM   EKG 12-Lead   ECHOCARDIOGRAM COMPLETE   No orders of the defined types were placed in this encounter.    No chief complaint on file.    History of Present Illness:    Laura Singleton is a 79 y.o. female.  Patient has past medical history of symptomatic PVCs, Wolff-Parkinson-White pattern on EKG, mixed dyslipidemia and dyspnea on exertion.  She denies  any prior this time and takes care of activities of daily living.  She tells me that she is not lax with diet exercise.  She has gained weight.  At the time of my evaluation, the patient is alert awake oriented and in no distress.  She is on flecainide  by electrophysiology colleagues.  Past Medical History:  Diagnosis Date   Ankle weakness 07/15/2021   Arthritis    Balance problems 01/13/2023   Body mass index (BMI) 26.0-26.9, adult 06/20/2020   Cardiac arrhythmia 03/28/2020   Cardiac murmur 05/29/2020   Carpal tunnel syndrome 06/07/2018   Cervical myelopathy (HCC) 11/05/2016   Cervical pain 05/19/2022   Cervical radiculopathy 10/06/2016   Chronic pain of left ankle 07/15/2021   Chronic pain of left knee 11/17/2018   Degenerative scoliosis 05/19/2022   Depression    Depression    Dysrhythmia    Elevated blood pressure reading in office with white coat syndrome, without diagnosis of hypertension 05/07/2021   Elevated blood pressure reading in office with white coat syndrome, without diagnosis of hypertension 05/07/2021   Encounter for monitoring flecainide  therapy 05/29/2020   GERD (gastroesophageal reflux disease)    History of total knee arthroplasty 12/26/2019   Hyperlipidemia 11/05/2016   Hypothyroidism    Lumbar spondylolysis 06/23/2022   Myelopathy concurrent with and due to spinal stenosis of thoracic region [facility]) 04/07/2022   Myelopathy due to cervical spondylosis at two levels 06/23/2022   Myelopathy of cervical spinal cord with cervical radiculopathy (HCC) 11/05/2016   Neural foraminal stenosis  of thoracic spine 04/07/2022   Neuropathy 05/31/2018   Oropharyngeal dysphagia 12/01/2016   Osteoarthritis of right knee 01/29/2021   Preoperative cardiovascular examination 11/21/2019   PVC (premature ventricular contraction) 11/05/2016   PVC's (premature ventricular contractions)    S/P cervical spinal fusion 04/07/2022   S/P laminectomy 07/21/2022   S/P total knee  arthroplasty, left 12/26/2019   S/P total knee arthroplasty, right 02/11/2021   Skin cancer of face    Spinal stenosis 07/17/2020   Spinal stenosis in cervical region 10/06/2016   Ulnar neuropathy of both upper extremities 06/20/2020   Ventricular premature beats 11/05/2016   Wolff-Parkinson-White (WPW) pattern 11/05/2016   Wolff-Parkinson-White (WPW) syndrome    WPW (Wolff-Parkinson-White syndrome) 11/05/2016    Past Surgical History:  Procedure Laterality Date   ANTERIOR CERVICAL DECOMP/DISCECTOMY FUSION N/A 11/05/2016   Procedure: ANTERIOR CERVICAL DECOMPRESSION/DISCECTOMY FUSION  - CERVICAL THREE-FOUR, CERVICAL FIVE-SIX, CERVICAL SIX -SEVEN;  Surgeon: Onetha Kuba, MD;  Location: [facility] OR;  Service: Neurosurgery;  Laterality: N/A;   COLONOSCOPY     EYE SURGERY     both eyes lens implants   LUMBAR LAMINECTOMY/DECOMPRESSION MICRODISCECTOMY Bilateral 07/17/2020   Procedure: Laminectomy and Foraminotomy - bilateral - Thoracic ten-Thoracic eleven - Lumbar one-Lumbar two;  Surgeon: Onetha Kuba, MD;  Location: [facility] OR;  Service: Neurosurgery;  Laterality: Bilateral;   SKIN CANCER EXCISION     Face   thumb surgery Right    TONSILLECTOMY     TOTAL KNEE ARTHROPLASTY Left 12/26/2019   Procedure: TOTAL KNEE ARTHROPLASTY;  Surgeon: Ernie Cough, MD;  Location: [facility] ORS;  Service: Orthopedics;  Laterality: Left;  70 mins   TOTAL KNEE ARTHROPLASTY Right 02/11/2021   Procedure: TOTAL KNEE ARTHROPLASTY;  Surgeon: Ernie Cough, MD;  Location: [facility] ORS;  Service: Orthopedics;  Laterality: Right;   TUBAL LIGATION      Current Medications: Active Medications[1]   Allergies:   Bee venom, Parafon forte dsc [chlorzoxazone], and Sulfa antibiotics   Social History   Socioeconomic History   Marital status: Widowed    Spouse name: Not on file   Number of children: Not on file   Years of education: Not on file   Highest education level: Not on file  Occupational History   Not on file  Tobacco Use    Smoking status: Former    Types: Cigarettes   Smokeless tobacco: Never   Tobacco comments:    Quit 40 years ago  Vaping Use   Vaping status: Never Used  Substance and Sexual Activity   Alcohol use: No   Drug use: No   Sexual activity: Not on file  Other Topics Concern   Not on file  Social History Narrative   Not on file   Social Drivers of Health   Tobacco Use: Medium Risk (03/16/2024)   Patient History    Smoking Tobacco Use: Former    Smokeless Tobacco Use: Never    Passive Exposure: Not on Actuary Strain: Not on file  Food Insecurity: Low Risk (12/21/2023)   Received from [facility]   Epic    Within the past 12 months, you worried that your food would run out before you got money to buy more: Never true    Within the past 12 months, the food you bought just didn't last and you didn't have money to get more. : Never true  Transportation Needs: No Transportation Needs (12/21/2023)   Received from [facility]    In the past 12  months, has lack of reliable transportation kept you from medical appointments, meetings, work or from getting things needed for daily living? : No  Physical Activity: Not on file  Stress: Not on file  Social Connections: Not on file  Depression (EYV7-0): Not on file  Alcohol Screen: Not on file  Housing: Low Risk (12/21/2023)   Received from [facility]   Epic    What is your living situation today?: I have a steady place to live    Think about the place you live. Do you have problems with any of the following? Choose all that apply:: None/None on this list  Utilities: Low Risk (12/21/2023)   Received from [facility]   Utilities    In the past 12 months has the electric, gas, oil, or water  company threatened to shut off services in your home? : No  Health Literacy: Not on file     Family History: The patient's family history includes High Cholesterol in her mother; High blood pressure in her brother  and mother.  ROS:   Please see the history of present illness.    All other systems reviewed and are negative.  EKGs/Labs/Other Studies Reviewed:    The following studies were reviewed today: .SABRAEKG Interpretation Date/Time:  Thursday March 16 2024 14:46:16 EST Ventricular Rate:  61 PR Interval:  172 QRS Duration:  84 QT Interval:  446 QTC Calculation: 448 R Axis:   7  Text Interpretation: Normal sinus rhythm Possible Anterior infarct , age undetermined Abnormal ECG When compared with ECG of 03-May-2023 11:49, Inverted T waves have replaced nonspecific T wave abnormality in Inferior leads Nonspecific T wave abnormality now evident in Anterior leads Confirmed by Edwyna Backers [phone]) on 03/16/2024 2:56:35 PM     Recent Labs: No results found for requested labs within last 365 days.  Recent Lipid Panel No results found for: CHOL, TRIG, HDL, CHOLHDL, VLDL, LDLCALC, LDLDIRECT  Physical Exam:    VS:  BP 138/88   Pulse 61   Ht 5' 3 (1.6 m)   Wt 171 lb 6.4 oz (77.7 kg)   SpO2 97%   BMI 30.36 kg/m     Wt Readings from Last 3 Encounters:  03/16/24 171 lb 6.4 oz (77.7 kg)  05/03/23 164 lb (74.4 kg)  01/21/23 163 lb (73.9 kg)     GEN: Patient is in no acute distress HEENT: Normal NECK: No JVD; No carotid bruits LYMPHATICS: No lymphadenopathy CARDIAC: Hear sounds regular, 2/6 systolic murmur at the apex. RESPIRATORY:  Clear to auscultation without rales, wheezing or rhonchi  ABDOMEN: Soft, non-tender, non-distended MUSCULOSKELETAL:  No edema; No deformity  SKIN: Warm and dry NEUROLOGIC:  Alert and oriented x 3 PSYCHIATRIC:  Normal affect   Signed, Backers JONELLE Edwyna, MD  03/16/2024 3:05 PM    [facility]     [1]  Current Meds  Medication Sig   albuterol (VENTOLIN HFA) 108 (90 Base) MCG/ACT inhaler Inhale 2 puffs into the lungs every 4 (four) hours as needed for shortness of breath.   alendronate (FOSAMAX) 70 MG tablet Take 70 mg  by mouth once a week.   atorvastatin  (LIPITOR) 20 MG tablet Take 20 mg by mouth at bedtime.   calcium  carbonate (OS-CAL) 600 MG TABS tablet Take 1,200 mg by mouth at bedtime.   flecainide  (TAMBOCOR ) 100 MG tablet TAKE 1 TABLET BY MOUTH TWICE DAILY   levothyroxine  (SYNTHROID ) 112 MCG tablet Take 112 mcg by mouth daily before breakfast.  Multiple Vitamin (MULTIVITAMIN WITH MINERALS) TABS tablet Take 1 tablet by mouth daily. Centrum Silver   omeprazole (PRILOSEC) 40 MG capsule Take 40 mg by mouth daily.   ropinirole  (REQUIP ) 5 MG tablet Take 5 mg by mouth at bedtime.   sertraline  (ZOLOFT ) 100 MG tablet Take 200 mg by mouth daily.   [DISCONTINUED] traZODone  (DESYREL ) 100 MG tablet Take 100 mg by mouth at bedtime.   "

## 2024-03-16 NOTE — Patient Instructions (Signed)
 Medication Instructions:  Your physician recommends that you continue on your current medications as directed. Please refer to the Current Medication list given to you today.  *If you need a refill on your cardiac medications before your next appointment, please call your pharmacy*   Lab Work: None ordered If you have labs (blood work) drawn today and your tests are completely normal, you will receive your results only by: MyChart Message (if you have MyChart) OR A paper copy in the mail If you have any lab test that is abnormal or we need to change your treatment, we will call you to review the results.  Testing/Procedures: Your physician has requested that you have an echocardiogram. Echocardiography is a painless test that uses sound waves to create images of your heart. It provides your doctor with information about the size and shape of your heart and how well your hearts chambers and valves are working. This procedure takes approximately one hour. There are no restrictions for this procedure. Please do NOT wear cologne, perfume, aftershave, or lotions (deodorant is allowed). Please arrive 15 minutes prior to your appointment time.  Please note: We ask at that you not bring children with you during ultrasound (echo/ vascular) testing. Due to room size and safety concerns, children are not allowed in the ultrasound rooms during exams. Our front office staff cannot provide observation of children in our lobby area while testing is being conducted. An adult accompanying a patient to their appointment will only be allowed in the ultrasound room at the discretion of the ultrasound technician under special circumstances. We apologize for any inconvenience.    Your cardiac CT will be scheduled at one of the below locations:   [facility] [address] [place], [place] [phone]  Please follow these instructions carefully (unless otherwise directed):  An IV will be required for  this test and Nitroglycerin will be given.   On the Night Before the Test: Be sure to Drink plenty of water . Do not consume any caffeinated/decaffeinated beverages or chocolate 12 hours prior to your test. Do not take any antihistamines 12 hours prior to your test.  On the Day of the Test: Drink plenty of water  until 1 hour prior to the test. Do not eat any food 1 hour prior to test. You may take your regular medications prior to the test.       After the Test: Drink plenty of water . After receiving IV contrast, you may experience a mild flushed feeling. This is normal. On occasion, you may experience a mild rash up to 24 hours after the test. This is not dangerous. If this occurs, you can take Benadryl  25 mg, Zyrtec, Claritin, or Allegra and increase your fluid intake. (Patients taking Tikosyn should avoid Benadryl , and may take Zyrtec, Claritin, or Allegra) If you experience trouble breathing, this can be serious. If it is severe call 911 IMMEDIATELY. If it is mild, please call our office.  We will call to schedule your test 2-4 weeks out understanding that some insurance companies will need an authorization prior to the service being performed.   For more information and frequently asked questions, please visit our website : [email or web address]  For non-scheduling related questions, please contact the cardiac imaging nurse navigator should you have any questions/concerns: Cardiac Imaging Nurse Navigators Direct Office Dial: [phone]   For scheduling needs, including cancellations and rescheduling, please call Brittany, [phone].   Follow-Up: At [facility], you and your health needs are our priority.  As  part of our continuing mission to provide you with exceptional heart care, we have created designated Provider Care Teams.  These Care Teams include your primary Cardiologist (physician) and Advanced Practice Providers (APPs -  Physician Assistants and Nurse  Practitioners) who all work together to provide you with the care you need, when you need it.  We recommend signing up for the patient portal called MyChart.  Sign up information is provided on this After Visit Summary.  MyChart is used to connect with patients for Virtual Visits (Telemedicine).  Patients are able to view lab/test results, encounter notes, upcoming appointments, etc.  Non-urgent messages can be sent to your provider as well.   To learn more about what you can do with MyChart, go to [email or web address].    Your next appointment:   9 month(s)  The format for your next appointment:   In Person  Provider:   Jennifer Crape, MD   Other Instructions Echocardiogram An echocardiogram is a test that uses sound waves (ultrasound) to produce images of the heart. Images from an echocardiogram can provide important information about: Heart size and shape. The size and thickness and movement of your heart's walls. Heart muscle function and strength. Heart valve function or if you have stenosis. Stenosis is when the heart valves are too narrow. If blood is flowing backward through the heart valves (regurgitation). A tumor or infectious growth around the heart valves. Areas of heart muscle that are not working well because of poor blood flow or injury from a heart attack. Aneurysm detection. An aneurysm is a weak or damaged part of an artery wall. The wall bulges out from the normal force of blood pumping through the body. Tell a health care provider about: Any allergies you have. All medicines you are taking, including vitamins, herbs, eye drops, creams, and over-the-counter medicines. Any blood disorders you have. Any surgeries you have had. Any medical conditions you have. Whether you are pregnant or may be pregnant. What are the risks? Generally, this is a safe test. However, problems may occur, including an allergic reaction to dye (contrast) that may be used during the  test. What happens before the test? No specific preparation is needed. You may eat and drink normally. What happens during the test? You will take off your clothes from the waist up and put on a hospital gown. Electrodes or electrocardiogram (ECG)patches may be placed on your chest. The electrodes or patches are then connected to a device that monitors your heart rate and rhythm. You will lie down on a table for an ultrasound exam. A gel will be applied to your chest to help sound waves pass through your skin. A handheld device, called a transducer, will be pressed against your chest and moved over your heart. The transducer produces sound waves that travel to your heart and bounce back (or echo back) to the transducer. These sound waves will be captured in real-time and changed into images of your heart that can be viewed on a video monitor. The images will be recorded on a computer and reviewed by your health care provider. You may be asked to change positions or hold your breath for a short time. This makes it easier to get different views or better views of your heart. In some cases, you may receive contrast through an IV in one of your veins. This can improve the quality of the pictures from your heart. The procedure may vary among health care providers and hospitals.   What  can I expect after the test? You may return to your normal, everyday life, including diet, activities, and medicines, unless your health care provider tells you not to do that. Follow these instructions at home: It is up to you to get the results of your test. Ask your health care provider, or the department that is doing the test, when your results will be ready. Keep all follow-up visits. This is important. Summary An echocardiogram is a test that uses sound waves (ultrasound) to produce images of the heart. Images from an echocardiogram can provide important information about the size and shape of your heart, heart  muscle function, heart valve function, and other possible heart problems. You do not need to do anything to prepare before this test. You may eat and drink normally. After the echocardiogram is completed, you may return to your normal, everyday life, unless your health care provider tells you not to do that. This information is not intended to replace advice given to you by your health care provider. Make sure you discuss any questions you have with your health care provider. Document Revised: 10/17/2019 Document Reviewed: 10/17/2019 Elsevier Patient Education  2021 Elsevier Inc.   Important Information About Sugar

## 2024-03-27 ENCOUNTER — Encounter (HOSPITAL_COMMUNITY): Payer: Self-pay

## 2024-03-28 ENCOUNTER — Telehealth (HOSPITAL_COMMUNITY): Payer: Self-pay | Admitting: *Deleted

## 2024-03-28 NOTE — Telephone Encounter (Signed)
 Reaching out to patient to offer assistance regarding upcoming cardiac imaging study; pt verbalizes understanding of appt date/time, parking situation and where to check in, pre-test NPO status, and verified current allergies; name and call back number provided for further questions should they arise  Larey Brick RN Navigator Cardiac Imaging [facility] [phone] office [phone] cell

## 2024-03-29 ENCOUNTER — Ambulatory Visit (HOSPITAL_BASED_OUTPATIENT_CLINIC_OR_DEPARTMENT_OTHER)
Admission: RE | Admit: 2024-03-29 | Discharge: 2024-03-29 | Disposition: A | Source: Ambulatory Visit | Attending: Cardiology | Admitting: Cardiology

## 2024-03-29 DIAGNOSIS — I456 Pre-excitation syndrome: Secondary | ICD-10-CM

## 2024-03-29 DIAGNOSIS — R0609 Other forms of dyspnea: Secondary | ICD-10-CM

## 2024-03-29 MED ORDER — NITROGLYCERIN 0.4 MG SL SUBL
0.8000 mg | SUBLINGUAL_TABLET | Freq: Once | SUBLINGUAL | Status: AC
  Administered 2024-03-29: 0.8 mg via SUBLINGUAL

## 2024-03-29 MED ORDER — IOHEXOL 350 MG/ML SOLN
100.0000 mL | Freq: Once | INTRAVENOUS | Status: AC | PRN
  Administered 2024-03-29: 100 mL via INTRAVENOUS

## 2024-03-31 ENCOUNTER — Ambulatory Visit: Payer: Self-pay | Admitting: Cardiology

## 2024-04-12 ENCOUNTER — Ambulatory Visit

## 2024-04-12 DIAGNOSIS — R0609 Other forms of dyspnea: Secondary | ICD-10-CM | POA: Diagnosis not present

## 2024-04-12 DIAGNOSIS — I456 Pre-excitation syndrome: Secondary | ICD-10-CM | POA: Diagnosis not present

## 2024-04-12 LAB — ECHOCARDIOGRAM COMPLETE
Area-P 1/2: 3.12 cm2
S' Lateral: 2.6 cm
